# Patient Record
Sex: Male | Born: 1945 | Race: White | Hispanic: No | Marital: Married | Smoking: Never smoker
Health system: Southern US, Community
[De-identification: ages and names within clinical notes are randomized; demographics above are authoritative.]

## PROBLEM LIST (undated history)

## (undated) DIAGNOSIS — G4730 Sleep apnea, unspecified: Secondary | ICD-10-CM

## (undated) DIAGNOSIS — I480 Paroxysmal atrial fibrillation: Secondary | ICD-10-CM

## (undated) DIAGNOSIS — K5792 Diverticulitis of intestine, part unspecified, without perforation or abscess without bleeding: Secondary | ICD-10-CM

## (undated) DIAGNOSIS — I499 Cardiac arrhythmia, unspecified: Secondary | ICD-10-CM

## (undated) HISTORY — PX: NO PAST SURGERIES: SHX2092

## (undated) HISTORY — DX: Paroxysmal atrial fibrillation: I48.0

## (undated) HISTORY — PX: APPENDECTOMY: SHX54

## (undated) HISTORY — DX: Sleep apnea, unspecified: G47.30

## (undated) HISTORY — PX: ESOPHAGOGASTRIC FUNDOPLICATION: SHX405

---

## 1997-12-18 ENCOUNTER — Ambulatory Visit: Admission: RE | Admit: 1997-12-18 | Discharge: 1997-12-18 | Payer: Self-pay | Admitting: Internal Medicine

## 1999-06-26 ENCOUNTER — Ambulatory Visit (HOSPITAL_COMMUNITY): Admission: RE | Admit: 1999-06-26 | Discharge: 1999-06-26 | Payer: Self-pay | Admitting: Gastroenterology

## 2003-06-06 ENCOUNTER — Encounter: Admission: RE | Admit: 2003-06-06 | Discharge: 2003-06-06 | Payer: Self-pay | Admitting: Internal Medicine

## 2006-01-15 ENCOUNTER — Emergency Department (HOSPITAL_COMMUNITY): Admission: EM | Admit: 2006-01-15 | Discharge: 2006-01-15 | Payer: Self-pay | Admitting: Emergency Medicine

## 2009-10-23 DIAGNOSIS — H9319 Tinnitus, unspecified ear: Secondary | ICD-10-CM | POA: Insufficient documentation

## 2009-10-23 DIAGNOSIS — G4733 Obstructive sleep apnea (adult) (pediatric): Secondary | ICD-10-CM | POA: Insufficient documentation

## 2011-01-14 DIAGNOSIS — R7301 Impaired fasting glucose: Secondary | ICD-10-CM | POA: Insufficient documentation

## 2011-02-07 ENCOUNTER — Telehealth: Payer: Self-pay | Admitting: Internal Medicine

## 2011-02-07 NOTE — Telephone Encounter (Signed)
Called and spoke with pt.  Pt states he had a sleep study done approx 12 to 14 years ago and was dx with severe osa.  Pt states he has been doing fine.  Wears cpap machine regularly. Keeps up with mask changes and supplies that his PCP orders and signs for him.  Pt just wanted to know if there was a need for him to have a repeat sleep study.  Informed pt that unless his insurance requires him to have one or if he is symptomatic like excessive daytime sleepiness or increase or decrease in weight than he shouldn't need another sleep study.  Pt denied any of these.  States he has gained "only a few pounds"  Denies being tired during the day and denies any complaints with his cpap machine.  Nothing further needed.  Will sign off on message.

## 2011-03-19 DIAGNOSIS — Z86010 Personal history of colonic polyps: Secondary | ICD-10-CM | POA: Diagnosis not present

## 2011-03-19 DIAGNOSIS — K5730 Diverticulosis of large intestine without perforation or abscess without bleeding: Secondary | ICD-10-CM | POA: Diagnosis not present

## 2011-03-19 DIAGNOSIS — Z09 Encounter for follow-up examination after completed treatment for conditions other than malignant neoplasm: Secondary | ICD-10-CM | POA: Diagnosis not present

## 2011-03-19 DIAGNOSIS — Z800 Family history of malignant neoplasm of digestive organs: Secondary | ICD-10-CM | POA: Diagnosis not present

## 2011-03-19 DIAGNOSIS — D126 Benign neoplasm of colon, unspecified: Secondary | ICD-10-CM | POA: Diagnosis not present

## 2011-03-26 DIAGNOSIS — L570 Actinic keratosis: Secondary | ICD-10-CM | POA: Diagnosis not present

## 2011-03-26 DIAGNOSIS — D044 Carcinoma in situ of skin of scalp and neck: Secondary | ICD-10-CM | POA: Diagnosis not present

## 2011-03-26 DIAGNOSIS — D485 Neoplasm of uncertain behavior of skin: Secondary | ICD-10-CM | POA: Diagnosis not present

## 2011-03-27 DIAGNOSIS — H4011X0 Primary open-angle glaucoma, stage unspecified: Secondary | ICD-10-CM | POA: Diagnosis not present

## 2011-03-27 DIAGNOSIS — H409 Unspecified glaucoma: Secondary | ICD-10-CM | POA: Diagnosis not present

## 2011-05-07 DIAGNOSIS — H4010X0 Unspecified open-angle glaucoma, stage unspecified: Secondary | ICD-10-CM | POA: Diagnosis not present

## 2011-09-23 DIAGNOSIS — I10 Essential (primary) hypertension: Secondary | ICD-10-CM | POA: Diagnosis not present

## 2011-09-23 DIAGNOSIS — R5381 Other malaise: Secondary | ICD-10-CM | POA: Diagnosis not present

## 2011-09-23 DIAGNOSIS — R5383 Other fatigue: Secondary | ICD-10-CM | POA: Diagnosis not present

## 2011-09-23 DIAGNOSIS — F0390 Unspecified dementia without behavioral disturbance: Secondary | ICD-10-CM | POA: Diagnosis not present

## 2011-09-23 DIAGNOSIS — R269 Unspecified abnormalities of gait and mobility: Secondary | ICD-10-CM | POA: Diagnosis not present

## 2011-09-26 DIAGNOSIS — H40019 Open angle with borderline findings, low risk, unspecified eye: Secondary | ICD-10-CM | POA: Diagnosis not present

## 2011-10-02 DIAGNOSIS — L570 Actinic keratosis: Secondary | ICD-10-CM | POA: Diagnosis not present

## 2011-10-02 DIAGNOSIS — L738 Other specified follicular disorders: Secondary | ICD-10-CM | POA: Diagnosis not present

## 2011-10-02 DIAGNOSIS — D485 Neoplasm of uncertain behavior of skin: Secondary | ICD-10-CM | POA: Diagnosis not present

## 2011-10-10 DIAGNOSIS — H60399 Other infective otitis externa, unspecified ear: Secondary | ICD-10-CM | POA: Diagnosis not present

## 2011-10-10 DIAGNOSIS — H66009 Acute suppurative otitis media without spontaneous rupture of ear drum, unspecified ear: Secondary | ICD-10-CM | POA: Diagnosis not present

## 2011-10-24 DIAGNOSIS — H93299 Other abnormal auditory perceptions, unspecified ear: Secondary | ICD-10-CM | POA: Diagnosis not present

## 2011-10-24 DIAGNOSIS — H60399 Other infective otitis externa, unspecified ear: Secondary | ICD-10-CM | POA: Diagnosis not present

## 2011-11-28 DIAGNOSIS — Z23 Encounter for immunization: Secondary | ICD-10-CM | POA: Diagnosis not present

## 2012-01-21 DIAGNOSIS — H353190 Nonexudative age-related macular degeneration, unspecified eye, stage unspecified: Secondary | ICD-10-CM | POA: Diagnosis not present

## 2012-02-13 DIAGNOSIS — H01119 Allergic dermatitis of unspecified eye, unspecified eyelid: Secondary | ICD-10-CM | POA: Diagnosis not present

## 2012-03-19 DIAGNOSIS — E781 Pure hyperglyceridemia: Secondary | ICD-10-CM | POA: Diagnosis not present

## 2012-03-19 DIAGNOSIS — I4891 Unspecified atrial fibrillation: Secondary | ICD-10-CM | POA: Diagnosis not present

## 2012-03-19 DIAGNOSIS — R7301 Impaired fasting glucose: Secondary | ICD-10-CM | POA: Diagnosis not present

## 2012-03-19 DIAGNOSIS — Z125 Encounter for screening for malignant neoplasm of prostate: Secondary | ICD-10-CM | POA: Diagnosis not present

## 2012-03-19 DIAGNOSIS — Z0000 Encounter for general adult medical examination without abnormal findings: Secondary | ICD-10-CM | POA: Diagnosis not present

## 2012-03-31 DIAGNOSIS — H353290 Exudative age-related macular degeneration, unspecified eye, stage unspecified: Secondary | ICD-10-CM | POA: Diagnosis not present

## 2012-03-31 DIAGNOSIS — H35379 Puckering of macula, unspecified eye: Secondary | ICD-10-CM | POA: Diagnosis not present

## 2012-03-31 DIAGNOSIS — H2589 Other age-related cataract: Secondary | ICD-10-CM | POA: Diagnosis not present

## 2012-03-31 DIAGNOSIS — H4011X0 Primary open-angle glaucoma, stage unspecified: Secondary | ICD-10-CM | POA: Diagnosis not present

## 2012-04-02 DIAGNOSIS — Z0000 Encounter for general adult medical examination without abnormal findings: Secondary | ICD-10-CM | POA: Diagnosis not present

## 2012-04-02 DIAGNOSIS — R011 Cardiac murmur, unspecified: Secondary | ICD-10-CM | POA: Diagnosis not present

## 2012-04-02 DIAGNOSIS — R7301 Impaired fasting glucose: Secondary | ICD-10-CM | POA: Diagnosis not present

## 2012-04-02 DIAGNOSIS — Z125 Encounter for screening for malignant neoplasm of prostate: Secondary | ICD-10-CM | POA: Diagnosis not present

## 2012-04-22 DIAGNOSIS — L570 Actinic keratosis: Secondary | ICD-10-CM | POA: Diagnosis not present

## 2012-07-01 DIAGNOSIS — E785 Hyperlipidemia, unspecified: Secondary | ICD-10-CM | POA: Diagnosis not present

## 2012-09-02 DIAGNOSIS — H409 Unspecified glaucoma: Secondary | ICD-10-CM | POA: Diagnosis not present

## 2012-09-02 DIAGNOSIS — H4011X0 Primary open-angle glaucoma, stage unspecified: Secondary | ICD-10-CM | POA: Diagnosis not present

## 2012-09-23 DIAGNOSIS — H4010X0 Unspecified open-angle glaucoma, stage unspecified: Secondary | ICD-10-CM | POA: Diagnosis not present

## 2012-09-23 DIAGNOSIS — H40019 Open angle with borderline findings, low risk, unspecified eye: Secondary | ICD-10-CM | POA: Diagnosis not present

## 2012-09-23 DIAGNOSIS — H409 Unspecified glaucoma: Secondary | ICD-10-CM | POA: Diagnosis not present

## 2012-11-09 DIAGNOSIS — E781 Pure hyperglyceridemia: Secondary | ICD-10-CM | POA: Diagnosis not present

## 2012-11-09 DIAGNOSIS — Z23 Encounter for immunization: Secondary | ICD-10-CM | POA: Diagnosis not present

## 2012-12-28 DIAGNOSIS — L259 Unspecified contact dermatitis, unspecified cause: Secondary | ICD-10-CM | POA: Diagnosis not present

## 2012-12-28 DIAGNOSIS — L570 Actinic keratosis: Secondary | ICD-10-CM | POA: Diagnosis not present

## 2012-12-28 DIAGNOSIS — B079 Viral wart, unspecified: Secondary | ICD-10-CM | POA: Diagnosis not present

## 2012-12-28 DIAGNOSIS — D485 Neoplasm of uncertain behavior of skin: Secondary | ICD-10-CM | POA: Diagnosis not present

## 2012-12-30 DIAGNOSIS — H903 Sensorineural hearing loss, bilateral: Secondary | ICD-10-CM | POA: Diagnosis not present

## 2013-02-13 DIAGNOSIS — R1032 Left lower quadrant pain: Secondary | ICD-10-CM | POA: Diagnosis not present

## 2013-02-24 DIAGNOSIS — H4011X0 Primary open-angle glaucoma, stage unspecified: Secondary | ICD-10-CM | POA: Diagnosis not present

## 2013-02-24 DIAGNOSIS — H52229 Regular astigmatism, unspecified eye: Secondary | ICD-10-CM | POA: Diagnosis not present

## 2013-02-24 DIAGNOSIS — H2589 Other age-related cataract: Secondary | ICD-10-CM | POA: Diagnosis not present

## 2013-02-24 DIAGNOSIS — H5210 Myopia, unspecified eye: Secondary | ICD-10-CM | POA: Diagnosis not present

## 2013-03-02 ENCOUNTER — Other Ambulatory Visit: Payer: Self-pay | Admitting: Gastroenterology

## 2013-03-02 DIAGNOSIS — R1032 Left lower quadrant pain: Secondary | ICD-10-CM | POA: Diagnosis not present

## 2013-03-04 ENCOUNTER — Ambulatory Visit
Admission: RE | Admit: 2013-03-04 | Discharge: 2013-03-04 | Disposition: A | Discharge status: Home / Self Care | Payer: Medicare Other | Source: Ambulatory Visit | Attending: Gastroenterology | Admitting: Gastroenterology

## 2013-03-04 DIAGNOSIS — R1032 Left lower quadrant pain: Secondary | ICD-10-CM

## 2013-03-04 DIAGNOSIS — K429 Umbilical hernia without obstruction or gangrene: Secondary | ICD-10-CM | POA: Diagnosis not present

## 2013-03-04 DIAGNOSIS — K5732 Diverticulitis of large intestine without perforation or abscess without bleeding: Secondary | ICD-10-CM | POA: Diagnosis not present

## 2013-03-04 MED ORDER — IOHEXOL 300 MG/ML  SOLN
125.0000 mL | Freq: Once | INTRAMUSCULAR | Status: AC | PRN
  Administered 2013-03-04: 125 mL via INTRAVENOUS

## 2013-04-08 DIAGNOSIS — Z125 Encounter for screening for malignant neoplasm of prostate: Secondary | ICD-10-CM | POA: Diagnosis not present

## 2013-04-08 DIAGNOSIS — I4891 Unspecified atrial fibrillation: Secondary | ICD-10-CM | POA: Diagnosis not present

## 2013-04-08 DIAGNOSIS — E781 Pure hyperglyceridemia: Secondary | ICD-10-CM | POA: Diagnosis not present

## 2013-04-08 DIAGNOSIS — R7301 Impaired fasting glucose: Secondary | ICD-10-CM | POA: Diagnosis not present

## 2013-04-20 DIAGNOSIS — R7301 Impaired fasting glucose: Secondary | ICD-10-CM | POA: Diagnosis not present

## 2013-04-20 DIAGNOSIS — Z125 Encounter for screening for malignant neoplasm of prostate: Secondary | ICD-10-CM | POA: Diagnosis not present

## 2013-04-20 DIAGNOSIS — Z1331 Encounter for screening for depression: Secondary | ICD-10-CM | POA: Diagnosis not present

## 2013-04-20 DIAGNOSIS — Z0000 Encounter for general adult medical examination without abnormal findings: Secondary | ICD-10-CM | POA: Diagnosis not present

## 2013-04-20 DIAGNOSIS — E781 Pure hyperglyceridemia: Secondary | ICD-10-CM | POA: Diagnosis not present

## 2013-04-20 DIAGNOSIS — L570 Actinic keratosis: Secondary | ICD-10-CM | POA: Diagnosis not present

## 2013-04-20 DIAGNOSIS — G4733 Obstructive sleep apnea (adult) (pediatric): Secondary | ICD-10-CM | POA: Diagnosis not present

## 2013-04-20 DIAGNOSIS — H9319 Tinnitus, unspecified ear: Secondary | ICD-10-CM | POA: Diagnosis not present

## 2013-04-20 DIAGNOSIS — I4891 Unspecified atrial fibrillation: Secondary | ICD-10-CM | POA: Diagnosis not present

## 2013-05-24 DIAGNOSIS — K5732 Diverticulitis of large intestine without perforation or abscess without bleeding: Secondary | ICD-10-CM | POA: Diagnosis not present

## 2013-05-24 DIAGNOSIS — R1032 Left lower quadrant pain: Secondary | ICD-10-CM | POA: Diagnosis not present

## 2013-05-24 DIAGNOSIS — K5901 Slow transit constipation: Secondary | ICD-10-CM | POA: Diagnosis not present

## 2013-06-22 DIAGNOSIS — H409 Unspecified glaucoma: Secondary | ICD-10-CM | POA: Diagnosis not present

## 2013-06-22 DIAGNOSIS — H4011X0 Primary open-angle glaucoma, stage unspecified: Secondary | ICD-10-CM | POA: Diagnosis not present

## 2013-07-20 DIAGNOSIS — K5901 Slow transit constipation: Secondary | ICD-10-CM | POA: Diagnosis not present

## 2013-07-20 DIAGNOSIS — K5732 Diverticulitis of large intestine without perforation or abscess without bleeding: Secondary | ICD-10-CM | POA: Diagnosis not present

## 2013-08-09 DIAGNOSIS — E781 Pure hyperglyceridemia: Secondary | ICD-10-CM | POA: Diagnosis not present

## 2013-08-09 DIAGNOSIS — D649 Anemia, unspecified: Secondary | ICD-10-CM | POA: Diagnosis not present

## 2013-08-09 DIAGNOSIS — E785 Hyperlipidemia, unspecified: Secondary | ICD-10-CM | POA: Diagnosis not present

## 2013-09-22 DIAGNOSIS — H4011X0 Primary open-angle glaucoma, stage unspecified: Secondary | ICD-10-CM | POA: Diagnosis not present

## 2013-09-22 DIAGNOSIS — H4010X0 Unspecified open-angle glaucoma, stage unspecified: Secondary | ICD-10-CM | POA: Diagnosis not present

## 2013-10-23 DIAGNOSIS — H60339 Swimmer's ear, unspecified ear: Secondary | ICD-10-CM | POA: Diagnosis not present

## 2013-10-27 DIAGNOSIS — H6120 Impacted cerumen, unspecified ear: Secondary | ICD-10-CM | POA: Diagnosis not present

## 2013-10-27 DIAGNOSIS — H60399 Other infective otitis externa, unspecified ear: Secondary | ICD-10-CM | POA: Diagnosis not present

## 2013-11-24 DIAGNOSIS — Z23 Encounter for immunization: Secondary | ICD-10-CM | POA: Diagnosis not present

## 2013-12-14 DIAGNOSIS — L821 Other seborrheic keratosis: Secondary | ICD-10-CM | POA: Diagnosis not present

## 2013-12-14 DIAGNOSIS — D239 Other benign neoplasm of skin, unspecified: Secondary | ICD-10-CM | POA: Diagnosis not present

## 2013-12-14 DIAGNOSIS — L570 Actinic keratosis: Secondary | ICD-10-CM | POA: Diagnosis not present

## 2013-12-20 DIAGNOSIS — Z8669 Personal history of other diseases of the nervous system and sense organs: Secondary | ICD-10-CM | POA: Diagnosis not present

## 2014-01-26 DIAGNOSIS — H25819 Combined forms of age-related cataract, unspecified eye: Secondary | ICD-10-CM | POA: Diagnosis not present

## 2014-01-26 DIAGNOSIS — H4011X2 Primary open-angle glaucoma, moderate stage: Secondary | ICD-10-CM | POA: Diagnosis not present

## 2014-04-18 DIAGNOSIS — R7301 Impaired fasting glucose: Secondary | ICD-10-CM | POA: Diagnosis not present

## 2014-04-18 DIAGNOSIS — Z125 Encounter for screening for malignant neoplasm of prostate: Secondary | ICD-10-CM | POA: Diagnosis not present

## 2014-04-18 DIAGNOSIS — Z008 Encounter for other general examination: Secondary | ICD-10-CM | POA: Diagnosis not present

## 2014-04-18 DIAGNOSIS — D649 Anemia, unspecified: Secondary | ICD-10-CM | POA: Diagnosis not present

## 2014-04-18 DIAGNOSIS — E781 Pure hyperglyceridemia: Secondary | ICD-10-CM | POA: Diagnosis not present

## 2014-04-25 DIAGNOSIS — Z1389 Encounter for screening for other disorder: Secondary | ICD-10-CM | POA: Diagnosis not present

## 2014-04-25 DIAGNOSIS — L570 Actinic keratosis: Secondary | ICD-10-CM | POA: Diagnosis not present

## 2014-04-25 DIAGNOSIS — Z23 Encounter for immunization: Secondary | ICD-10-CM | POA: Diagnosis not present

## 2014-04-25 DIAGNOSIS — Z0000 Encounter for general adult medical examination without abnormal findings: Secondary | ICD-10-CM | POA: Diagnosis not present

## 2014-04-25 DIAGNOSIS — Z6829 Body mass index (BMI) 29.0-29.9, adult: Secondary | ICD-10-CM | POA: Diagnosis not present

## 2014-04-25 DIAGNOSIS — R011 Cardiac murmur, unspecified: Secondary | ICD-10-CM | POA: Diagnosis not present

## 2014-04-25 DIAGNOSIS — I480 Paroxysmal atrial fibrillation: Secondary | ICD-10-CM | POA: Diagnosis not present

## 2014-04-25 DIAGNOSIS — K219 Gastro-esophageal reflux disease without esophagitis: Secondary | ICD-10-CM | POA: Diagnosis not present

## 2014-04-25 DIAGNOSIS — R7301 Impaired fasting glucose: Secondary | ICD-10-CM | POA: Diagnosis not present

## 2014-04-25 DIAGNOSIS — G4733 Obstructive sleep apnea (adult) (pediatric): Secondary | ICD-10-CM | POA: Diagnosis not present

## 2014-04-25 DIAGNOSIS — E781 Pure hyperglyceridemia: Secondary | ICD-10-CM | POA: Diagnosis not present

## 2014-04-25 DIAGNOSIS — Z125 Encounter for screening for malignant neoplasm of prostate: Secondary | ICD-10-CM | POA: Diagnosis not present

## 2014-05-09 DIAGNOSIS — H4011X3 Primary open-angle glaucoma, severe stage: Secondary | ICD-10-CM | POA: Diagnosis not present

## 2014-05-18 DIAGNOSIS — H10413 Chronic giant papillary conjunctivitis, bilateral: Secondary | ICD-10-CM | POA: Diagnosis not present

## 2014-05-27 DIAGNOSIS — H10413 Chronic giant papillary conjunctivitis, bilateral: Secondary | ICD-10-CM | POA: Diagnosis not present

## 2014-06-06 ENCOUNTER — Encounter: Payer: Self-pay | Admitting: *Deleted

## 2014-06-07 ENCOUNTER — Ambulatory Visit (INDEPENDENT_AMBULATORY_CARE_PROVIDER_SITE_OTHER): Payer: Medicare Other | Admitting: Interventional Cardiology

## 2014-06-07 ENCOUNTER — Encounter: Payer: Self-pay | Admitting: Interventional Cardiology

## 2014-06-07 VITALS — BP 148/80 | HR 53 | Ht 68.0 in | Wt 200.8 lb

## 2014-06-07 DIAGNOSIS — R030 Elevated blood-pressure reading, without diagnosis of hypertension: Secondary | ICD-10-CM | POA: Insufficient documentation

## 2014-06-07 DIAGNOSIS — E785 Hyperlipidemia, unspecified: Secondary | ICD-10-CM | POA: Insufficient documentation

## 2014-06-07 DIAGNOSIS — I480 Paroxysmal atrial fibrillation: Secondary | ICD-10-CM

## 2014-06-07 HISTORY — DX: Elevated blood-pressure reading, without diagnosis of hypertension: R03.0

## 2014-06-07 HISTORY — DX: Hyperlipidemia, unspecified: E78.5

## 2014-06-07 NOTE — Progress Notes (Signed)
Patient ID: Zachary Montoya, male   DOB: 07/21/1945, 69 y.o.   MRN: [id number]     Cardiology Office Note   Date:  06/07/2014   ID:  Zachary Montoya, DOB 1945/12/26, MRN [id number]  PCP:  No primary care provider on file.    No chief complaint on file. PAF   Wt Readings from Last 3 Encounters:  06/07/14 200 lb 12.8 oz (91.082 kg)       History of Present Illness: Zachary Montoya is a 69 y.o. male  Who has had PAF in the past.  I last saw him in 12 /12. He contiues to have episodes of AFib once every couple months.  Episodes last up to 2-3 hours.  The longest ever was 4-6 hours.  THis is rare.  Uses CPAP for his sleep apnea.  No h/o HTN, DM.  BP is getting borderline at this time.  Typical numbers are 818-299 systolic.    Exercises 3x/week without any sx.  No CP or SHOB.  No change in exercise tolerance.  Lost 20 lbs intentionally. His diet is healthy.     Past Medical History  Diagnosis Date  . Sleep apnea     No past surgical history on file.   Current Outpatient Prescriptions  Medication Sig Dispense Refill  . aspirin 325 MG tablet Take 325 mg by mouth daily.    Marland Kitchen atorvastatin (LIPITOR) 20 MG tablet Take 20 mg by mouth daily.  3  . Calcium Carbonate-Vitamin D (CALCIUM-D PO) Take 1 tablet by mouth daily. 600 mg calcium + 800 iu vit. d    . cetirizine (ZYRTEC) 10 MG tablet Take 10 mg by mouth daily.    . Cholecalciferol (VITAMIN D3) 2000 UNITS TABS Take 2,000 Units by mouth daily.    . Coenzyme Q10 (COQ10 PO) Take 300 mg by mouth daily.    . ferrous sulfate 325 (65 FE) MG tablet Take 325 mg by mouth daily.    . metoprolol tartrate (LOPRESSOR) 25 MG tablet Take 25 mg by mouth 2 (two) times daily as needed (For palpitations, A-fib).    . Multiple Vitamins-Minerals (CENTRUM ADULTS PO) Take 1 tablet by mouth daily.    . Omega-3 Fatty Acids (OMEGA 3 PO) Take 1,280 mg by mouth daily.    Marland Kitchen omeprazole (PRILOSEC) 20 MG capsule Take 20 mg by mouth daily.  3  . Probiotic  Product (PROBIOTIC DAILY PO) Take 1 tablet by mouth daily.    . timolol (TIMOPTIC) 0.5 % ophthalmic solution Place 2 drops into both eyes daily.  3  . Triamcinolone Acetonide (NASACORT ALLERGY 24HR NA) Place 1 drop into the nose daily.     No current facility-administered medications for this visit.    Allergies:   Review of patient's allergies indicates no known allergies.    Social History:  The patient  reports that he has never smoked. He has never used smokeless tobacco.   Family History:  The patient's family history includes Cancer in his brother; Diabetes in his mother; Hypertension in his mother.    ROS:  Please see the history of present illness.   Otherwise, review of systems are positive for episodic palpitations.   All other systems are reviewed and negative.    PHYSICAL EXAM: VS:  BP 148/80 mmHg  Pulse 53  Ht 5\' 8"  (1.727 m)  Wt 200 lb 12.8 oz (91.082 kg)  BMI 30.54 kg/m2 , BMI Body mass index is 30.54 kg/(m^2). GEN: Well nourished, well developed,  in no acute distress HEENT: normal Neck: no JVD, carotid bruits, or masses Cardiac: RRR; 2/6 systolic murmur,no rubs, or gallops,no edema ; 2+ PT pulses bilaterally Respiratory:  clear to auscultation bilaterally, normal work of breathing GI: soft, nontender, nondistended, + BS MS: no deformity or atrophy Skin: warm and dry, no rash Neuro:  Strength and sensation are intact Psych: euthymic mood, full affect   EKG:   The ekg ordered today demonstrates Sinus bradycardia, no ST segment changes   Recent Labs: No results found for requested labs within last 365 days.   Lipid Panel No results found for: CHOL, TRIG, HDL, CHOLHDL, VLDL, LDLCALC, LDLDIRECT   Other studies Reviewed: Additional studies/ records that were reviewed today with results demonstrating: prior records from [facility].   ASSESSMENT AND PLAN:  1. AFib: paroxysmal: Continue when necessary metoprolol. Symptoms are not bothersome enough that he would  want to take the metoprolol regularly. Regardless, he has resting bradycardia so medication for tachycardia would be challenging.  Would need to consider EP referral if tachyarrhythmia gets more frequent. 2. Hyperlipidemia: Continue lipid-lowering therapy. 3. Borderline blood pressure: Continue to monitor. Continue regular exercise. Continue low-salt diet. This patients CHA2DS2-VASc Score and unadjusted Ischemic Stroke Rate (% per year) is equal to 0.6 % stroke rate/year from a score of 1  Above score calculated as 1 point each if present [CHF, HTN, DM, Vascular=MI/PAD/Aortic Plaque, Age if 65-74, or Male] Above score calculated as 2 points each if present [Age > 75, or Stroke/TIA/TE]     Current medicines are reviewed at length with the patient today.  The patient concerns regarding his medicines were addressed.  The following changes have been made:  No change  Labs/ tests ordered today include:  No orders of the defined types were placed in this encounter.    Recommend 150 minutes/week of aerobic exercise Low fat, low carb, high fiber diet recommended  Disposition:   FU in 1 year   Teresita Madura., MD  06/07/2014 2:30 PM    [facility] [place], [place], [place]  [address] Phone: [phone]; Fax: [phone]

## 2014-06-07 NOTE — Patient Instructions (Signed)
Medication Instructions:  Your physician recommends that you continue on your current medications as directed. Please refer to the Current Medication list given to you today.   Labwork: NONE  Testing/Procedures: NONE  Follow-Up: FOLLOW UP WITH DR. VARANASI IN 1 YEAR  Any Other Special Instructions Will Be Listed Below (If Applicable).

## 2014-06-08 DIAGNOSIS — H401231 Low-tension glaucoma, bilateral, mild stage: Secondary | ICD-10-CM | POA: Diagnosis not present

## 2014-09-20 DIAGNOSIS — D485 Neoplasm of uncertain behavior of skin: Secondary | ICD-10-CM | POA: Diagnosis not present

## 2014-09-20 DIAGNOSIS — L821 Other seborrheic keratosis: Secondary | ICD-10-CM | POA: Diagnosis not present

## 2014-09-20 DIAGNOSIS — B078 Other viral warts: Secondary | ICD-10-CM | POA: Diagnosis not present

## 2014-09-20 DIAGNOSIS — L820 Inflamed seborrheic keratosis: Secondary | ICD-10-CM | POA: Diagnosis not present

## 2014-09-20 DIAGNOSIS — L570 Actinic keratosis: Secondary | ICD-10-CM | POA: Diagnosis not present

## 2014-09-26 DIAGNOSIS — H43811 Vitreous degeneration, right eye: Secondary | ICD-10-CM | POA: Diagnosis not present

## 2014-09-26 DIAGNOSIS — H2511 Age-related nuclear cataract, right eye: Secondary | ICD-10-CM | POA: Diagnosis not present

## 2014-09-26 DIAGNOSIS — H4010X0 Unspecified open-angle glaucoma, stage unspecified: Secondary | ICD-10-CM | POA: Diagnosis not present

## 2014-09-26 DIAGNOSIS — H18411 Arcus senilis, right eye: Secondary | ICD-10-CM | POA: Diagnosis not present

## 2014-12-01 DIAGNOSIS — Z23 Encounter for immunization: Secondary | ICD-10-CM | POA: Diagnosis not present

## 2014-12-22 DIAGNOSIS — R001 Bradycardia, unspecified: Secondary | ICD-10-CM | POA: Diagnosis not present

## 2014-12-22 DIAGNOSIS — I480 Paroxysmal atrial fibrillation: Secondary | ICD-10-CM | POA: Diagnosis not present

## 2014-12-22 DIAGNOSIS — R7301 Impaired fasting glucose: Secondary | ICD-10-CM | POA: Diagnosis not present

## 2014-12-22 DIAGNOSIS — R42 Dizziness and giddiness: Secondary | ICD-10-CM | POA: Diagnosis not present

## 2014-12-22 DIAGNOSIS — G4733 Obstructive sleep apnea (adult) (pediatric): Secondary | ICD-10-CM | POA: Diagnosis not present

## 2014-12-22 DIAGNOSIS — Z6830 Body mass index (BMI) 30.0-30.9, adult: Secondary | ICD-10-CM | POA: Diagnosis not present

## 2014-12-22 DIAGNOSIS — H6123 Impacted cerumen, bilateral: Secondary | ICD-10-CM | POA: Diagnosis not present

## 2014-12-22 DIAGNOSIS — R55 Syncope and collapse: Secondary | ICD-10-CM | POA: Diagnosis not present

## 2014-12-26 ENCOUNTER — Ambulatory Visit (INDEPENDENT_AMBULATORY_CARE_PROVIDER_SITE_OTHER): Payer: Medicare Other | Admitting: Nurse Practitioner

## 2014-12-26 ENCOUNTER — Encounter: Payer: Self-pay | Admitting: Nurse Practitioner

## 2014-12-26 VITALS — BP 118/88 | HR 61 | Ht 68.0 in | Wt 202.0 lb

## 2014-12-26 DIAGNOSIS — R42 Dizziness and giddiness: Secondary | ICD-10-CM | POA: Diagnosis not present

## 2014-12-26 DIAGNOSIS — R55 Syncope and collapse: Secondary | ICD-10-CM

## 2014-12-26 DIAGNOSIS — I480 Paroxysmal atrial fibrillation: Secondary | ICD-10-CM | POA: Diagnosis not present

## 2014-12-26 NOTE — Addendum Note (Signed)
Addended by: Dennie Fetters on: 12/26/2014 03:10 PM   Modules accepted: Orders, Medications

## 2014-12-26 NOTE — Progress Notes (Signed)
CARDIOLOGY OFFICE NOTE  Date:  12/26/2014    [name] Date of Birth: Nov 30, 1945 Medical Record #[id number]  PCP:  Jerlyn Ly, MD  Cardiologist:  Irish Lack    Chief Complaint  Patient presents with  . Atrial Fibrillation    Work in visit - seen for Dr. Irish Lack    History of Present Illness: Zachary Montoya is a 69 y.o. male who presents today for a work in visit. Seen for Dr. Irish Lack.   He has a history of PAF in the past.Seen previously in 2012 prior to last visit.  He has had episodes of AFib once every couple months. Episodes last up to 2-3 hours. The longest ever was 4-6 hours and this is noted as rare. Uses CPAP for his sleep apnea. No h/o HTN, DM. BP is getting borderline.  Seen by PCP last week with near syncope and dizziness. Had his ears cleaned out. Concern for need for possible event monitor. Noted that a week ago he had trace nausea and dizziness. Tuesday he felt like he was going to fall on the floor. No chest pain or dyspnea. Wednesday he felt a feeling of pressure in the head and woozy afterwards. Had also had some spinning sensation that resolved spontaneously. Some marital issues noted.   Comes in today. Here with his wife. Says he is not dizzy anymore. Stopped Timoptic eye drops as of Friday night and now "feels fine". No chest pain. He remains in NSR. He notes that for years he has been watching his HR get ever "so slow". Typically in the low to mid 50's. He now feels fine. Exercises 3 times a week without issue. No chest pain. Breathing is good. Typically has a spell of Af about every 8 to 12 weeks - will last anywhere from 2 to 8 hours. He uses metoprolol just prn.    Past Medical History  Diagnosis Date  . Sleep apnea   . PAF (paroxysmal atrial fibrillation) ([facility])     No past surgical history on file.   Medications: Current Outpatient Prescriptions  Medication Sig Dispense Refill  . aspirin 325 MG tablet Take 325 mg by mouth  daily.    Marland Kitchen atorvastatin (LIPITOR) 20 MG tablet Take 20 mg by mouth daily.  3  . Calcium Carbonate-Vitamin D (CALCIUM-D PO) Take 1 tablet by mouth daily. 600 mg calcium + 800 iu vit. d    . cetirizine (ZYRTEC) 10 MG tablet Take 10 mg by mouth daily.    . Cholecalciferol (VITAMIN D3) 2000 UNITS TABS Take 2,000 Units by mouth daily.    . Coenzyme Q10 (COQ10 PO) Take 300 mg by mouth daily.    . ferrous sulfate 325 (65 FE) MG tablet Take 325 mg by mouth daily.    . metoprolol tartrate (LOPRESSOR) 25 MG tablet Take 25 mg by mouth 2 (two) times daily as needed (For palpitations, A-fib).    . Multiple Vitamins-Minerals (CENTRUM ADULTS PO) Take 1 tablet by mouth daily.    . Omega-3 Fatty Acids (OMEGA 3 PO) Take 1,280 mg by mouth daily.    Marland Kitchen omeprazole (PRILOSEC) 20 MG capsule Take 20 mg by mouth daily.  3  . Probiotic Product (PROBIOTIC DAILY PO) Take 1 tablet by mouth daily.    . timolol (TIMOPTIC) 0.5 % ophthalmic solution Place 2 drops into both eyes daily.  3  . Triamcinolone Acetonide (NASACORT ALLERGY 24HR NA) Place 1 drop into the nose daily.     No  current facility-administered medications for this visit.    Allergies: Allergies  Allergen Reactions  . Timolol     Bradycardia/dizziness    Social History: The patient  reports that he has never smoked. He has never used smokeless tobacco.   Family History: The patient's family history includes Cancer in his brother; Diabetes in his mother; Hypertension in his mother.   Review of Systems: Please see the history of present illness.   Otherwise, the review of systems is positive for none.   All other systems are reviewed and negative.   Physical Exam: VS:  BP 118/88 mmHg  Pulse 61  Ht 5\' 8"  (1.727 m)  Wt 202 lb (91.627 kg)  BMI 30.72 kg/m2 .  BMI Body mass index is 30.72 kg/(m^2).  Wt Readings from Last 3 Encounters:  12/26/14 202 lb (91.627 kg)  06/07/14 200 lb 12.8 oz (91.082 kg)    General: Pleasant. Well developed, well  nourished and in no acute distress.  HEENT: Normal. Neck: Supple, no JVD, carotid bruits, or masses noted.  Cardiac: Regular rate and rhythm. He has an outflow murmur noted. No edema.  Respiratory:  Lungs are clear to auscultation bilaterally with normal work of breathing.  GI: Soft and nontender.  MS: No deformity or atrophy. Gait and ROM intact. Skin: Warm and dry. Color is normal.  Neuro:  Strength and sensation are intact and no gross focal deficits noted.  Psych: Alert, appropriate and with normal affect.   LABORATORY DATA:  EKG:  EKG is ordered today. This demonstrates NSR - rate is 61.  No results found for: WBC, HGB, HCT, PLT, GLUCOSE, CHOL, TRIG, HDL, LDLDIRECT, LDLCALC, ALT, AST, NA, K, CL, CREATININE, BUN, CO2, TSH, PSA, INR, GLUF, HGBA1C, MICROALBUR  BNP (last 3 results) No results for input(s): BNP in the last 8760 hours.  ProBNP (last 3 results) No results for input(s): PROBNP in the last 8760 hours.   Other Studies Reviewed Today:   Assessment/Plan: 1. AFib: paroxysmal: He has not had a change in his episodes of atrial fib. He is using just prn metoprolol. Symptoms are not bothersome enough that he would need to take the metoprolol regularly. Regardless, he has resting bradycardia so medication for tachycardia would be challenging. Would need to consider EP referral if tachyarrhythmia gets more frequent. 2. Hyperlipidemia: Continue lipid-lowering therapy. 3. Borderline blood pressure: Continue to monitor. Continue regular exercise. Continue low-salt diet. 4. Dizziness/presyncope - feels like it was from bradycardia from Timoptic. He is now totally asymptomatic. Would hold on event monitor for now but certainly proceed if he has recurrence. This reiterates the fact that any medicine for tachycardia will be challenging.  5. Murmur - will get echo updated.    Current medicines are reviewed with the patient today.  The patient does not have concerns regarding medicines  other than what has been noted above.  The following changes have been made:  See above.  Labs/ tests ordered today include:    Orders Placed This Encounter  Procedures  . EKG 12-Lead  . ECHOCARDIOGRAM COMPLETE     Disposition:   FU with Dr. Irish Lack as planned next April.   Patient is agreeable to this plan and will call if any problems develop in the interim.   Signed: Burtis Junes, RN, ANP-C 12/26/2014 3:06 PM  [facility] [address] [place], [place]  [address] Phone: [phone] Fax: [phone]

## 2014-12-26 NOTE — Patient Instructions (Addendum)
We will be checking the following labs today - NONE   Medication Instructions:    Continue with your current medicines.     Testing/Procedures To Be Arranged:  Echocardiogram  Follow-Up:   See Dr. Irish Lack as planned in April    Other Special Instructions:   If you have recurrent spells - call us so we can place a heart monitor.     If you need a refill on your cardiac medications before your next appointment, please call your pharmacy.   Call the [place] office at [phone] if you have any questions, problems or concerns.

## 2014-12-30 ENCOUNTER — Encounter: Payer: Self-pay | Admitting: Nurse Practitioner

## 2015-01-03 ENCOUNTER — Other Ambulatory Visit: Payer: Self-pay

## 2015-01-03 ENCOUNTER — Ambulatory Visit (HOSPITAL_COMMUNITY): Payer: Medicare Other | Attending: Cardiovascular Disease

## 2015-01-03 DIAGNOSIS — R55 Syncope and collapse: Secondary | ICD-10-CM

## 2015-01-03 DIAGNOSIS — I517 Cardiomegaly: Secondary | ICD-10-CM | POA: Diagnosis not present

## 2015-01-03 DIAGNOSIS — E785 Hyperlipidemia, unspecified: Secondary | ICD-10-CM | POA: Insufficient documentation

## 2015-01-03 DIAGNOSIS — I071 Rheumatic tricuspid insufficiency: Secondary | ICD-10-CM | POA: Insufficient documentation

## 2015-01-03 DIAGNOSIS — I371 Nonrheumatic pulmonary valve insufficiency: Secondary | ICD-10-CM | POA: Insufficient documentation

## 2015-01-03 DIAGNOSIS — I4891 Unspecified atrial fibrillation: Secondary | ICD-10-CM | POA: Diagnosis not present

## 2015-01-03 DIAGNOSIS — R42 Dizziness and giddiness: Secondary | ICD-10-CM | POA: Diagnosis not present

## 2015-01-03 DIAGNOSIS — I351 Nonrheumatic aortic (valve) insufficiency: Secondary | ICD-10-CM | POA: Insufficient documentation

## 2015-01-03 DIAGNOSIS — I480 Paroxysmal atrial fibrillation: Secondary | ICD-10-CM

## 2015-02-17 DIAGNOSIS — H401210 Low-tension glaucoma, right eye, stage unspecified: Secondary | ICD-10-CM | POA: Diagnosis not present

## 2015-03-30 DIAGNOSIS — G589 Mononeuropathy, unspecified: Secondary | ICD-10-CM | POA: Diagnosis not present

## 2015-03-30 DIAGNOSIS — R509 Fever, unspecified: Secondary | ICD-10-CM | POA: Diagnosis not present

## 2015-03-30 DIAGNOSIS — M542 Cervicalgia: Secondary | ICD-10-CM | POA: Diagnosis not present

## 2015-03-30 DIAGNOSIS — R51 Headache: Secondary | ICD-10-CM | POA: Diagnosis not present

## 2015-03-30 DIAGNOSIS — J019 Acute sinusitis, unspecified: Secondary | ICD-10-CM | POA: Diagnosis not present

## 2015-03-30 DIAGNOSIS — Z6829 Body mass index (BMI) 29.0-29.9, adult: Secondary | ICD-10-CM | POA: Diagnosis not present

## 2015-06-13 DIAGNOSIS — I480 Paroxysmal atrial fibrillation: Secondary | ICD-10-CM | POA: Diagnosis not present

## 2015-06-13 DIAGNOSIS — E781 Pure hyperglyceridemia: Secondary | ICD-10-CM | POA: Diagnosis not present

## 2015-06-13 DIAGNOSIS — R7301 Impaired fasting glucose: Secondary | ICD-10-CM | POA: Diagnosis not present

## 2015-06-13 DIAGNOSIS — D6489 Other specified anemias: Secondary | ICD-10-CM | POA: Diagnosis not present

## 2015-06-13 DIAGNOSIS — Z125 Encounter for screening for malignant neoplasm of prostate: Secondary | ICD-10-CM | POA: Diagnosis not present

## 2015-06-20 DIAGNOSIS — Z0000 Encounter for general adult medical examination without abnormal findings: Secondary | ICD-10-CM | POA: Diagnosis not present

## 2015-06-20 DIAGNOSIS — E781 Pure hyperglyceridemia: Secondary | ICD-10-CM | POA: Diagnosis not present

## 2015-06-20 DIAGNOSIS — G588 Other specified mononeuropathies: Secondary | ICD-10-CM | POA: Diagnosis not present

## 2015-06-20 DIAGNOSIS — Z6830 Body mass index (BMI) 30.0-30.9, adult: Secondary | ICD-10-CM | POA: Diagnosis not present

## 2015-06-20 DIAGNOSIS — R7301 Impaired fasting glucose: Secondary | ICD-10-CM | POA: Diagnosis not present

## 2015-06-20 DIAGNOSIS — H9193 Unspecified hearing loss, bilateral: Secondary | ICD-10-CM | POA: Diagnosis not present

## 2015-06-20 DIAGNOSIS — I480 Paroxysmal atrial fibrillation: Secondary | ICD-10-CM | POA: Diagnosis not present

## 2015-06-20 DIAGNOSIS — R51 Headache: Secondary | ICD-10-CM | POA: Diagnosis not present

## 2015-06-20 DIAGNOSIS — R001 Bradycardia, unspecified: Secondary | ICD-10-CM | POA: Diagnosis not present

## 2015-06-20 DIAGNOSIS — D6489 Other specified anemias: Secondary | ICD-10-CM | POA: Diagnosis not present

## 2015-06-20 DIAGNOSIS — L570 Actinic keratosis: Secondary | ICD-10-CM | POA: Diagnosis not present

## 2015-06-20 DIAGNOSIS — Z1389 Encounter for screening for other disorder: Secondary | ICD-10-CM | POA: Diagnosis not present

## 2015-06-27 NOTE — Progress Notes (Signed)
Patient ID: Zachary Montoya, male   DOB: Sep 14, 1945, 70 y.o.   MRN: [id number]     Cardiology Office Note   Date:  06/28/2015   ID:  Zachary Montoya, DOB Jul 07, 1945, MRN [id number]  PCP:  Jerlyn Ly, MD    No chief complaint on file. AFib   Wt Readings from Last 3 Encounters:  06/28/15 201 lb 1.9 oz (91.227 kg)  12/26/14 202 lb (91.627 kg)  06/07/14 200 lb 12.8 oz (91.082 kg)       History of Present Illness: Zachary Montoya is a 70 y.o. male  Who has had PAF in the past. I last saw him in 4/16. He contiues to have episodes of AFib, but now more frequently- every otherday. Episodes last up to 2-3 hours. The longest ever was 10 hours. THis is rare.  He takes metoprolol for the prolonged episodes. Uses CPAP for his sleep apnea. No h/o HTN, DM. BP is getting borderline at this time. Typical numbers are [id number] systolic.   Exercises 3x/week without any sx. No CP or SHOB. No change in exercise tolerance. Lost 20 lbs intentionally. His diet is healthy.     Past Medical History  Diagnosis Date  . Sleep apnea   . PAF (paroxysmal atrial fibrillation) ([facility])     History reviewed. No pertinent past surgical history.   Current Outpatient Prescriptions  Medication Sig Dispense Refill  . aspirin 325 MG tablet Take 325 mg by mouth daily.    Marland Kitchen atorvastatin (LIPITOR) 20 MG tablet Take 20 mg by mouth daily.  3  . Calcium Carbonate-Vitamin D (CALCIUM-D PO) Take 1 tablet by mouth daily. 600 mg calcium + 800 iu vit. d    . cetirizine (ZYRTEC) 10 MG tablet Take 10 mg by mouth daily.    . Cholecalciferol (VITAMIN D3) 2000 UNITS TABS Take 2,000 Units by mouth daily.    . Coenzyme Q10 (COQ10 PO) Take 300 mg by mouth daily.    . ferrous sulfate 325 (65 FE) MG tablet Take 325 mg by mouth daily.    . metoprolol tartrate (LOPRESSOR) 25 MG tablet Take 25 mg by mouth 2 (two) times daily as needed (For palpitations, A-fib).    . Multiple Vitamins-Minerals (CENTRUM ADULTS PO) Take 1  tablet by mouth daily.    . Multiple Vitamins-Minerals (ICAPS AREDS 2 PO) Take 1 capsule by mouth daily.    . Omega-3 Fatty Acids (OMEGA 3 PO) Take 1,280 mg by mouth daily.    Marland Kitchen omeprazole (PRILOSEC) 20 MG capsule Take 20 mg by mouth daily.  3  . Probiotic Product (PROBIOTIC DAILY PO) Take 1 tablet by mouth daily.    . Triamcinolone Acetonide (NASACORT ALLERGY 24HR NA) Place 1 drop into the nose daily.     No current facility-administered medications for this visit.    Allergies:   Timolol    Social History:  The patient  reports that he has never smoked. He has never used smokeless tobacco.   Family History:  The patient's family history includes Cancer in his brother; Diabetes in his mother; Hypertension in his mother; Stroke in his mother. There is no history of Heart attack.    ROS:  Please see the history of present illness.   Otherwise, review of systems are positive for palpitations.   All other systems are reviewed and negative.    PHYSICAL EXAM: VS:  BP 140/85 mmHg  Pulse 72  Ht 5\' 8"  (1.727 m)  Wt 201 lb 1.9 oz (  91.227 kg)  BMI 30.59 kg/m2 , BMI Body mass index is 30.59 kg/(m^2). GEN: Well nourished, well developed, in no acute distress HEENT: normal Neck: no JVD, carotid bruits, or masses Cardiac: RRR; no murmurs, rubs, or gallops,no edema  Respiratory:  clear to auscultation bilaterally, normal work of breathing GI: soft, nontender, nondistended, + BS MS: no deformity or atrophy Skin: warm and dry, no rash Neuro:  Strength and sensation are intact Psych: euthymic mood, full affect    Recent Labs: No results found for requested labs within last 365 days.   Lipid Panel No results found for: CHOL, TRIG, HDL, CHOLHDL, VLDL, LDLCALC, LDLDIRECT   Other studies Reviewed: Additional studies/ records that were reviewed today with results demonstrating: Normal LV function in 11/16..   ASSESSMENT AND PLAN:  1. AFib: paroxysmal: Increased frequency and duration of  symptoms. Continue when necessary metoprolol for now.  Plan for event monitor.  If sx persist, start metoprolol 25 mg BID.  Would need to consider EP referral depending on findings from monitor. This patients CHA2DS2-VASc Score and unadjusted Ischemic Stroke Rate (% per year) is equal to 0.6 % stroke rate/year from a score of 1  Above score calculated as 1 point each if present [CHF, HTN, DM, Vascular=MI/PAD/Aortic Plaque, Age if 65-74, or Male] Above score calculated as 2 points each if present [Age > 75, or Stroke/TIA/TE]   2. Hyperlipidemia: Continue lipid-lowering therapy. 3. Borderline blood pressure: Continue to monitor. Continue regular exercise. Continue low-salt diet. BP at blood donation has been in the 130/75 range.    Current medicines are reviewed at length with the patient today.  The patient concerns regarding his medicines were addressed.  The following changes have been made:  No change  Labs/ tests ordered today include: event monitor No orders of the defined types were placed in this encounter.    Recommend 150 minutes/week of aerobic exercise Low fat, low carb, high fiber diet recommended  Disposition:   FU in 6 weeks   Teresita Madura., MD  06/28/2015 10:00 AM    [facility] [place], [place],   [address] Phone: [phone]; Fax: [phone]

## 2015-06-28 ENCOUNTER — Ambulatory Visit (INDEPENDENT_AMBULATORY_CARE_PROVIDER_SITE_OTHER): Payer: Medicare Other

## 2015-06-28 ENCOUNTER — Ambulatory Visit (INDEPENDENT_AMBULATORY_CARE_PROVIDER_SITE_OTHER): Payer: Medicare Other | Admitting: Interventional Cardiology

## 2015-06-28 ENCOUNTER — Encounter: Payer: Self-pay | Admitting: Interventional Cardiology

## 2015-06-28 VITALS — BP 140/85 | HR 72 | Ht 68.0 in | Wt 201.1 lb

## 2015-06-28 DIAGNOSIS — E785 Hyperlipidemia, unspecified: Secondary | ICD-10-CM

## 2015-06-28 DIAGNOSIS — I480 Paroxysmal atrial fibrillation: Secondary | ICD-10-CM

## 2015-06-28 DIAGNOSIS — R002 Palpitations: Secondary | ICD-10-CM

## 2015-06-28 DIAGNOSIS — R030 Elevated blood-pressure reading, without diagnosis of hypertension: Secondary | ICD-10-CM

## 2015-06-28 NOTE — Patient Instructions (Signed)
Medication Instructions:  Same-no changes  Labwork: None  Testing/Procedures: Your physician has recommended that you wear an event monitor for 30 days. Event monitors are medical devices that record the heart's electrical activity. Doctors most often [place] these monitors to diagnose arrhythmias. Arrhythmias are problems with the speed or rhythm of the heartbeat. The monitor is a small, portable device. You can wear one while you do your normal daily activities. This is usually used to diagnose what is causing palpitations/syncope (passing out).  Follow-Up: Your physician recommends that you schedule a follow-up appointment in: 6 weeks with NP/PA    Any Other Special Instructions Will Be Listed Below (If Applicable).     If you need a refill on your cardiac medications before your next appointment, please call your pharmacy.

## 2015-07-06 ENCOUNTER — Telehealth: Payer: Self-pay | Admitting: Interventional Cardiology

## 2015-07-06 NOTE — Telephone Encounter (Signed)
errror

## 2015-07-10 DIAGNOSIS — H25011 Cortical age-related cataract, right eye: Secondary | ICD-10-CM | POA: Diagnosis not present

## 2015-07-10 DIAGNOSIS — H2512 Age-related nuclear cataract, left eye: Secondary | ICD-10-CM | POA: Diagnosis not present

## 2015-07-10 DIAGNOSIS — H401290 Low-tension glaucoma, unspecified eye, stage unspecified: Secondary | ICD-10-CM | POA: Diagnosis not present

## 2015-07-10 DIAGNOSIS — H43811 Vitreous degeneration, right eye: Secondary | ICD-10-CM | POA: Diagnosis not present

## 2015-07-19 NOTE — Telephone Encounter (Signed)
Let me review the monitor and then we can see who he needs to see.

## 2015-07-19 NOTE — Telephone Encounter (Signed)
**Note De-Identified  Obfuscation** I attempted to schedule the pt an appt with a PA or NP but the pt declined. He states that he has been having "a number of different arhythmia's while wearing this monitor". He wants to f/u with Dr Irish Lack as Dr Irish Lack is the the provider that ordered his monitor. He states that it would be okay too if Dr Irish Lack wants to call him with his results.  Please advise.

## 2015-07-19 NOTE — Telephone Encounter (Signed)
**Note De-Identified  Obfuscation** The pt is advised and he is in agreement with plan.

## 2015-07-19 NOTE — Telephone Encounter (Signed)
New Message  Pt was called to resch appt from Cecilie Kicks due to schedule changes. Pt declined to resch appt with a PA or NP because he has a Holter monitor and doesn't believe that a PA or NP will be able to read the results accurately. He also states that since Dr. Irish Lack ordered the monitor, per pt the appt should be with Dr. Irish Lack. Pt understands that the next available appt is in September with Dr. Irish Lack. However he would like to speak to a nurse to retrieve either a sooner appt or call back to discuss if it is ok to see a PA. Please call

## 2015-08-04 ENCOUNTER — Other Ambulatory Visit: Payer: Self-pay

## 2015-08-04 MED ORDER — METOPROLOL TARTRATE 25 MG PO TABS: 25.0000 mg | ORAL_TABLET | Freq: Two times a day (BID) | ORAL | Status: DC

## 2015-08-07 ENCOUNTER — Ambulatory Visit: Payer: Medicare Other | Admitting: Cardiology

## 2015-08-10 ENCOUNTER — Other Ambulatory Visit: Payer: Self-pay

## 2015-08-10 MED ORDER — METOPROLOL TARTRATE 25 MG PO TABS: 25.0000 mg | ORAL_TABLET | ORAL | Status: DC | PRN

## 2015-08-11 ENCOUNTER — Ambulatory Visit: Payer: Medicare Other | Admitting: Interventional Cardiology

## 2015-08-15 NOTE — Progress Notes (Signed)
Electrophysiology Office Note   Date:  08/16/2015   ID:  Zachary Montoya, DOB 1945/11/20, MRN [id number]  PCP:  Jerlyn Ly, MD  Cardiologist:  Irish Lack Primary Electrophysiologist:  Constance Haw, MD    Chief Complaint  Patient presents with  . Follow-up    discuss ablation     History of Present Illness: Zachary Montoya is a 70 y.o. male who presents today for electrophysiology evaluation.   He has a history of OSA and PAF, DM.  Episodes of AF every other day.  Longest 10 hours.  Takes metoprolol for prolonged episodes.  Uses CPAP for OSA.  He was diagnosed with atrial fibrillation potentially back in the 1980s along with his diagnosis for sleep apnea. He says that he was being evaluated for ablation at that time, but when he was started on CPAP, his symptoms of atrial fibrillation were greatly reduced. Over the last few months to a year, he has had more symptoms from his atrial fibrillation. He says that he has severe palpitations, and when he is in atrial fibrillation he tends to urinate quite a bit. He also has some fatigue issues. He otherwise feels well and is able to do all of his daily activities.   Today, he denies symptoms of palpitations, chest pain, shortness of breath, orthopnea, PND, lower extremity edema, claudication, dizziness, presyncope, syncope, bleeding, or neurologic sequela. The patient is tolerating medications without difficulties and is otherwise without complaint today.    Past Medical History  Diagnosis Date  . Sleep apnea   . PAF (paroxysmal atrial fibrillation) [facility])    Past Surgical History  Procedure Laterality Date  . No past surgeries       Current Outpatient Prescriptions  Medication Sig Dispense Refill  . aspirin 325 MG tablet Take 325 mg by mouth daily.    Marland Kitchen atorvastatin (LIPITOR) 20 MG tablet Take 20 mg by mouth daily.  3  . Calcium Carbonate-Vitamin D (CALCIUM-D PO) Take 1 tablet by mouth daily. 600 mg calcium + 800 iu vit.  d    . cetirizine (ZYRTEC) 10 MG tablet Take 10 mg by mouth daily.    . Cholecalciferol (VITAMIN D3) 2000 UNITS TABS Take 2,000 Units by mouth daily.    . Coenzyme Q10 (COQ10 PO) Take 300 mg by mouth daily.    . ferrous sulfate 325 (65 FE) MG tablet Take 325 mg by mouth daily.    Marland Kitchen ibuprofen (ADVIL,MOTRIN) 200 MG tablet Take 400 mg by mouth every 6 (six) hours as needed (for pain).    . metoprolol tartrate (LOPRESSOR) 25 MG tablet Take 25 mg by mouth 2 (two) times daily.    . Multiple Vitamins-Minerals (CENTRUM ADULTS PO) Take 1 tablet by mouth daily.    . Omega-3 Fatty Acids (OMEGA 3 PO) Take 1,280 mg by mouth daily.    Marland Kitchen omeprazole (PRILOSEC) 20 MG capsule Take 20 mg by mouth daily.  3  . Triamcinolone Acetonide (NASACORT ALLERGY 24HR NA) Place 1 drop into the nose daily.     No current facility-administered medications for this visit.    Allergies:   Timolol   Social History:  The patient  reports that he has never smoked. He has never used smokeless tobacco.   Family History:  The patient's family history includes Cancer in his brother; Diabetes in his mother; Hypertension in his mother; Stroke in his mother. There is no history of Heart attack.    ROS:  Please see the history of  present illness.   Otherwise, review of systems is positive for palpitations.   All other systems are reviewed and negative.    PHYSICAL EXAM: VS:  BP 126/82 mmHg  Pulse 56  Ht 5\' 8"  (1.727 m)  Wt 207 lb (93.895 kg)  BMI 31.48 kg/m2 , BMI Body mass index is 31.48 kg/(m^2). GEN: Well nourished, well developed, in no acute distress HEENT: normal Neck: no JVD, carotid bruits, or masses Cardiac: RRR; no murmurs, rubs, or gallops,no edema  Respiratory:  clear to auscultation bilaterally, normal work of breathing GI: soft, nontender, nondistended, + BS MS: no deformity or atrophy Skin: warm and dry Neuro:  Strength and sensation are intact Psych: euthymic mood, full affect  EKG:  EKG is ordered  today. The ekg ordered today shows sinus rhythm, rate 54  Recent Labs: No results found for requested labs within last 365 days.    Lipid Panel  No results found for: CHOL, TRIG, HDL, CHOLHDL, VLDL, LDLCALC, LDLDIRECT   Wt Readings from Last 3 Encounters:  08/16/15 207 lb (93.895 kg)  06/28/15 201 lb 1.9 oz (91.227 kg)  12/26/14 202 lb (91.627 kg)      Other studies Reviewed: Additional studies/ records that were reviewed today include: TTE 01/03/15 - Left ventricle: The cavity size was normal. There was mild  concentric hypertrophy. Systolic function was normal. The  estimated ejection fraction was in the range of 60% to 65%. Wall  motion was normal; there were no regional wall motion  abnormalities. Left ventricular diastolic function parameters  were normal. - Aortic valve: There was trivial regurgitation. - Right ventricle: The cavity size was normal. Wall thickness was  normal. Systolic function was normal. - Tricuspid valve: There was trivial regurgitation. - Pulmonic valve: There was trivial regurgitation. - Inferior vena cava: The vessel was dilated. The respirophasic  diameter changes were blunted (< 50%), consistent with elevated  central venous pressure. - Global longitudinal strain -16.3%  Telemetry 06/28/15   NSR with Paroxysmal Atrial fibrillation.  Atrial fibrillation 3% of the time.   ASSESSMENT AND PLAN:  1.  Paroxysmal atrial fibrillation: Currently not anticoagulated as he has a low stroke risk. I discussed with him the options of treatment for atrial fibrillation. We discussed ablation and medical management. I did discuss the risks and benefits of ablation, which include bleeding, tamponade, heart block, stroke, and damage to surrounding organs, among others. We discussed the risks and benefits of medical management as well including risks with amiodarone, flecainide, and dofetilide. He understands these risks, and is feeling well on his current  doses of metoprolol. He wishes to continue on the metoprolol at this time without any changes to be made. I Znya Albino see him back in 3 months to determine if any future medication changes or ablation is an option.  This patients CHA2DS2-VASc Score and unadjusted Ischemic Stroke Rate (% per year) is equal to 0.6 % stroke rate/year from a score of 1  Above score calculated as 1 point each if present [CHF, HTN, DM, Vascular=MI/PAD/Aortic Plaque, Age if 65-74, or Male] Above score calculated as 2 points each if present [Age > 75, or Stroke/TIA/TE] Cletus Gash was 45And distal patellar that she follow-up with Dr. Velva Harman for her  Current medicines are reviewed at length with the patient today.   The patient does not have concerns regarding his medicines.  The following changes were made today:  none  Labs/ tests ordered today include:  No orders of the defined types were placed in  this encounter.     Disposition:   FU with Taesha Goodell 3 months  Signed, Kim Lauver Meredith Leeds, MD  08/16/2015 9:14 AM     [facility] [address] [place] [address] [phone] (office) [phone] (fax)

## 2015-08-16 ENCOUNTER — Ambulatory Visit (INDEPENDENT_AMBULATORY_CARE_PROVIDER_SITE_OTHER): Payer: Medicare Other | Admitting: Cardiology

## 2015-08-16 ENCOUNTER — Encounter: Payer: Self-pay | Admitting: *Deleted

## 2015-08-16 ENCOUNTER — Encounter: Payer: Self-pay | Admitting: Cardiology

## 2015-08-16 VITALS — BP 126/82 | HR 56 | Ht 68.0 in | Wt 207.0 lb

## 2015-08-16 DIAGNOSIS — I480 Paroxysmal atrial fibrillation: Secondary | ICD-10-CM

## 2015-08-16 NOTE — Patient Instructions (Signed)
Medication Instructions:   Your physician recommends that you continue on your current medications as directed. Please refer to the Current Medication list given to you today.  Labwork:  none  Testing/Procedures:  none  Follow-Up:  Your physician recommends that you schedule a follow-up appointment in: 3 months.  Any Other Special Instructions Will Be Listed Below (If Applicable).  If you need a refill on your cardiac medications before your next appointment, please call your pharmacy. 

## 2015-08-22 DIAGNOSIS — L570 Actinic keratosis: Secondary | ICD-10-CM | POA: Diagnosis not present

## 2015-08-22 DIAGNOSIS — D1800 Hemangioma unspecified site: Secondary | ICD-10-CM | POA: Diagnosis not present

## 2015-08-22 DIAGNOSIS — D239 Other benign neoplasm of skin, unspecified: Secondary | ICD-10-CM | POA: Diagnosis not present

## 2015-09-27 ENCOUNTER — Other Ambulatory Visit: Payer: Self-pay

## 2015-09-27 MED ORDER — METOPROLOL TARTRATE 25 MG PO TABS: 25.0000 mg | ORAL_TABLET | Freq: Two times a day (BID) | ORAL | 1 refills | Status: DC

## 2015-10-17 DIAGNOSIS — H353121 Nonexudative age-related macular degeneration, left eye, early dry stage: Secondary | ICD-10-CM | POA: Diagnosis not present

## 2015-10-17 DIAGNOSIS — H401112 Primary open-angle glaucoma, right eye, moderate stage: Secondary | ICD-10-CM | POA: Diagnosis not present

## 2015-10-17 DIAGNOSIS — H5213 Myopia, bilateral: Secondary | ICD-10-CM | POA: Diagnosis not present

## 2015-10-17 DIAGNOSIS — H401122 Primary open-angle glaucoma, left eye, moderate stage: Secondary | ICD-10-CM | POA: Diagnosis not present

## 2015-10-17 DIAGNOSIS — H353111 Nonexudative age-related macular degeneration, right eye, early dry stage: Secondary | ICD-10-CM | POA: Diagnosis not present

## 2015-10-17 DIAGNOSIS — H52223 Regular astigmatism, bilateral: Secondary | ICD-10-CM | POA: Diagnosis not present

## 2015-10-23 DIAGNOSIS — R350 Frequency of micturition: Secondary | ICD-10-CM | POA: Insufficient documentation

## 2015-10-23 DIAGNOSIS — Z6831 Body mass index (BMI) 31.0-31.9, adult: Secondary | ICD-10-CM | POA: Diagnosis not present

## 2015-10-23 DIAGNOSIS — J302 Other seasonal allergic rhinitis: Secondary | ICD-10-CM | POA: Diagnosis not present

## 2015-10-23 DIAGNOSIS — L239 Allergic contact dermatitis, unspecified cause: Secondary | ICD-10-CM | POA: Diagnosis not present

## 2015-10-23 DIAGNOSIS — H1013 Acute atopic conjunctivitis, bilateral: Secondary | ICD-10-CM | POA: Diagnosis not present

## 2015-10-23 DIAGNOSIS — J309 Allergic rhinitis, unspecified: Secondary | ICD-10-CM | POA: Insufficient documentation

## 2015-10-23 DIAGNOSIS — R300 Dysuria: Secondary | ICD-10-CM | POA: Diagnosis not present

## 2015-11-03 ENCOUNTER — Encounter: Payer: Self-pay | Admitting: Cardiology

## 2015-11-09 DIAGNOSIS — H401231 Low-tension glaucoma, bilateral, mild stage: Secondary | ICD-10-CM | POA: Diagnosis not present

## 2015-11-09 DIAGNOSIS — H401230 Low-tension glaucoma, bilateral, stage unspecified: Secondary | ICD-10-CM | POA: Diagnosis not present

## 2015-11-19 NOTE — Progress Notes (Signed)
Electrophysiology Office Note   Date:  11/20/2015   ID:  Malachai Ingersoll, [place] 12-Jan-1946, MRN [id number]  PCP:  Jerlyn Ly, MD  Cardiologist:  Irish Lack Primary Electrophysiologist:  Constance Haw, MD    Chief Complaint  Patient presents with  . Follow-up    PAF     History of Present Illness: Zachary Montoya is a 70 y.o. male who presents today for electrophysiology evaluation.   He has a history of OSA and PAF, DM.  Episodes of AF every other day.  Longest 10 hours.  Takes metoprolol for prolonged episodes.  Uses CPAP for OSA.  He was diagnosed with atrial fibrillation potentially back in the 1980s along with his diagnosis for sleep apnea. He says that he was being evaluated for ablation at that time, but when he was started on CPAP, his symptoms of atrial fibrillation were greatly reduced. It is last visit, his metoprolol was increased. He was initially having quite a bit of fatigue, but that has improved over the last few months. He is able to work out on his elliptical 3 days a week for up to 50 minutes. He had one episode of atrial fibrillation since his medications were adjusted. The episode lasted 14 hours. He took one extra dose of metoprolol which helped improve his symptoms.  Today, he denies symptoms of palpitations, chest pain, shortness of breath, orthopnea, PND, lower extremity edema, claudication, dizziness, presyncope, syncope, bleeding, or neurologic sequela. The patient is tolerating medications without difficulties and is otherwise without complaint today.    Past Medical History:  Diagnosis Date  . PAF (paroxysmal atrial fibrillation) ([facility])   . Sleep apnea    Past Surgical History:  Procedure Laterality Date  . NO PAST SURGERIES       Current Outpatient Prescriptions  Medication Sig Dispense Refill  . aspirin 325 MG tablet Take 325 mg by mouth daily.    Marland Kitchen atorvastatin (LIPITOR) 20 MG tablet Take 20 mg by mouth daily.  3  . Calcium  Carbonate-Vitamin D (CALCIUM-D PO) Take 1 tablet by mouth daily. 600 mg calcium + 800 iu vit. d    . cetirizine (ZYRTEC) 10 MG tablet Take 10 mg by mouth 2 (two) times daily.    . Cholecalciferol (VITAMIN D3) 2000 UNITS TABS Take 2,000 Units by mouth daily.    . Coenzyme Q10 (COQ10 PO) Take 300 mg by mouth daily.    . ferrous sulfate 325 (65 FE) MG tablet Take 325 mg by mouth daily.    Marland Kitchen ibuprofen (ADVIL,MOTRIN) 200 MG tablet Take 400 mg by mouth every 6 (six) hours as needed (for pain).    . metoprolol tartrate (LOPRESSOR) 25 MG tablet Take 1 tablet (25 mg total) by mouth 2 (two) times daily. 180 tablet 1  . Multiple Vitamins-Minerals (CENTRUM ADULTS PO) Take 1 tablet by mouth daily.    . Omega-3 Fatty Acids (OMEGA 3 PO) Take 1,280 mg by mouth daily.    Marland Kitchen omeprazole (PRILOSEC) 20 MG capsule Take 20 mg by mouth daily.  3  . Triamcinolone Acetonide (NASACORT ALLERGY 24HR NA) Place 1 drop into the nose daily.     No current facility-administered medications for this visit.     Allergies:   Timolol   Social History:  The patient  reports that he has never smoked. He has never used smokeless tobacco. He reports that he does not drink alcohol or use drugs.   Family History:  The patient's family history includes Cancer  in his brother; Diabetes in his mother; Hypertension in his mother; Stroke in his mother.    ROS:  Please see the history of present illness.   Otherwise, review of systems is positive for none.   All other systems are reviewed and negative.    PHYSICAL EXAM: VS:  BP 118/78   Pulse 62   Ht 5\' 8"  (1.727 m)   Wt 207 lb (93.9 kg)   BMI 31.47 kg/m  , BMI Body mass index is 31.47 kg/m. GEN: Well nourished, well developed, in no acute distress  HEENT: normal  Neck: no JVD, carotid bruits, or masses Cardiac: RRR; no murmurs, rubs, or gallops,no edema  Respiratory:  clear to auscultation bilaterally, normal work of breathing GI: soft, nontender, nondistended, + BS MS: no  deformity or atrophy  Skin: warm and dry Neuro:  Strength and sensation are intact Psych: euthymic mood, full affect  EKG:  EKG is not ordered today. Personal review of the ECG ordered 6/21 shows sinus rhythm, rate 54  Recent Labs: No results found for requested labs within last 8760 hours.    Lipid Panel  No results found for: CHOL, TRIG, HDL, CHOLHDL, VLDL, LDLCALC, LDLDIRECT   Wt Readings from Last 3 Encounters:  11/20/15 207 lb (93.9 kg)  08/16/15 207 lb (93.9 kg)  06/28/15 201 lb 1.9 oz (91.2 kg)      Other studies Reviewed: Additional studies/ records that were reviewed today include: TTE 01/03/15 - Left ventricle: The cavity size was normal. There was mild  concentric hypertrophy. Systolic function was normal. The  estimated ejection fraction was in the range of 60% to 65%. Wall  motion was normal; there were no regional wall motion  abnormalities. Left ventricular diastolic function parameters  were normal. - Aortic valve: There was trivial regurgitation. - Right ventricle: The cavity size was normal. Wall thickness was  normal. Systolic function was normal. - Tricuspid valve: There was trivial regurgitation. - Pulmonic valve: There was trivial regurgitation. - Inferior vena cava: The vessel was dilated. The respirophasic  diameter changes were blunted (< 50%), consistent with elevated  central venous pressure. - Global longitudinal strain -16.3%  Telemetry 06/28/15   NSR with Paroxysmal Atrial fibrillation.  Atrial fibrillation 3% of the time.   ASSESSMENT AND PLAN:  1.  Paroxysmal atrial fibrillation: Currently not anticoagulated as he has a low stroke risk. I discussed with him the options of treatment for atrial fibrillation. He understands the risks and benefits of both ablation and medical management for his atrial fibrillation. At this time he does not desire to start on any antiarrhythmic medications, and does not feel that ablation is necessary.  He says that if he has more frequent atrial fibrillation episodes, that he Zachary Montoya consider either medical or ablation management.  This patients CHA2DS2-VASc Score and unadjusted Ischemic Stroke Rate (% per year) is equal to 0.6 % stroke rate/year from a score of 1  Above score calculated as 1 point each if present [CHF, HTN, DM, Vascular=MI/PAD/Aortic Plaque, Age if 65-74, or Male] Above score calculated as 2 points each if present [Age > 75, or Stroke/TIA/TE]  2. OSA: controlled with CPAP  Current medicines are reviewed at length with the patient today.   The patient does not have concerns regarding his medicines.  The following changes were made today:  none  Labs/ tests ordered today include:  No orders of the defined types were placed in this encounter.    Disposition:   FU with Zachary Montoya  Zachary Montoya 6 months  Signed, Zachary Schwer Meredith Leeds, MD  11/20/2015 9:59 AM     [facility] [address] [place] [address] [phone] (office) [phone] (fax)

## 2015-11-20 ENCOUNTER — Encounter: Payer: Self-pay | Admitting: Cardiology

## 2015-11-20 ENCOUNTER — Ambulatory Visit (INDEPENDENT_AMBULATORY_CARE_PROVIDER_SITE_OTHER): Payer: Medicare Other | Admitting: Cardiology

## 2015-11-20 VITALS — BP 118/78 | HR 62 | Ht 68.0 in | Wt 207.0 lb

## 2015-11-20 DIAGNOSIS — I480 Paroxysmal atrial fibrillation: Secondary | ICD-10-CM | POA: Diagnosis not present

## 2015-11-20 NOTE — Patient Instructions (Addendum)
Medication Instructions:  Your physician recommends that you continue on your current medications as directed. Please refer to the Current Medication list given to you today.  If you need a refill on your cardiac medications before your next appointment, please call your pharmacy.   Labwork: None ordered  Testing/Procedures: None ordered  Follow-Up: Your physician wants you to follow-up in: 6 months with Dr. Camnitz.  You will receive a reminder letter in the mail two months in advance. If you don't receive a letter, please call our office to schedule the follow-up appointment.  Thank you for choosing [facility]!!   Anyra Kaufman, RN [phone]         

## 2015-12-04 DIAGNOSIS — K5732 Diverticulitis of large intestine without perforation or abscess without bleeding: Secondary | ICD-10-CM | POA: Diagnosis not present

## 2015-12-04 DIAGNOSIS — R1032 Left lower quadrant pain: Secondary | ICD-10-CM | POA: Diagnosis not present

## 2015-12-04 DIAGNOSIS — Z23 Encounter for immunization: Secondary | ICD-10-CM | POA: Diagnosis not present

## 2015-12-05 DIAGNOSIS — H6123 Impacted cerumen, bilateral: Secondary | ICD-10-CM | POA: Diagnosis not present

## 2015-12-05 DIAGNOSIS — Z6830 Body mass index (BMI) 30.0-30.9, adult: Secondary | ICD-10-CM | POA: Diagnosis not present

## 2015-12-26 DIAGNOSIS — H6122 Impacted cerumen, left ear: Secondary | ICD-10-CM | POA: Diagnosis not present

## 2016-01-04 DIAGNOSIS — H9192 Unspecified hearing loss, left ear: Secondary | ICD-10-CM | POA: Diagnosis not present

## 2016-01-04 DIAGNOSIS — H903 Sensorineural hearing loss, bilateral: Secondary | ICD-10-CM | POA: Diagnosis not present

## 2016-01-15 DIAGNOSIS — K5901 Slow transit constipation: Secondary | ICD-10-CM | POA: Diagnosis not present

## 2016-01-15 DIAGNOSIS — K5732 Diverticulitis of large intestine without perforation or abscess without bleeding: Secondary | ICD-10-CM | POA: Diagnosis not present

## 2016-01-15 DIAGNOSIS — Z86010 Personal history of colonic polyps: Secondary | ICD-10-CM | POA: Diagnosis not present

## 2016-03-28 DIAGNOSIS — H401231 Low-tension glaucoma, bilateral, mild stage: Secondary | ICD-10-CM | POA: Diagnosis not present

## 2016-03-28 DIAGNOSIS — H2513 Age-related nuclear cataract, bilateral: Secondary | ICD-10-CM | POA: Diagnosis not present

## 2016-03-28 DIAGNOSIS — H43812 Vitreous degeneration, left eye: Secondary | ICD-10-CM | POA: Diagnosis not present

## 2016-04-03 DIAGNOSIS — L570 Actinic keratosis: Secondary | ICD-10-CM | POA: Diagnosis not present

## 2016-04-03 DIAGNOSIS — L821 Other seborrheic keratosis: Secondary | ICD-10-CM | POA: Diagnosis not present

## 2016-04-03 DIAGNOSIS — D492 Neoplasm of unspecified behavior of bone, soft tissue, and skin: Secondary | ICD-10-CM | POA: Diagnosis not present

## 2016-04-05 ENCOUNTER — Other Ambulatory Visit: Payer: Self-pay | Admitting: Interventional Cardiology

## 2016-04-09 DIAGNOSIS — K5901 Slow transit constipation: Secondary | ICD-10-CM | POA: Diagnosis not present

## 2016-04-09 DIAGNOSIS — K5732 Diverticulitis of large intestine without perforation or abscess without bleeding: Secondary | ICD-10-CM | POA: Diagnosis not present

## 2016-04-16 DIAGNOSIS — D126 Benign neoplasm of colon, unspecified: Secondary | ICD-10-CM | POA: Diagnosis not present

## 2016-04-16 DIAGNOSIS — Z86010 Personal history of colonic polyps: Secondary | ICD-10-CM | POA: Diagnosis not present

## 2016-04-18 DIAGNOSIS — D126 Benign neoplasm of colon, unspecified: Secondary | ICD-10-CM | POA: Diagnosis not present

## 2016-05-14 ENCOUNTER — Encounter: Payer: Self-pay | Admitting: Cardiology

## 2016-05-23 ENCOUNTER — Encounter: Payer: Self-pay | Admitting: Cardiology

## 2016-05-23 ENCOUNTER — Ambulatory Visit (INDEPENDENT_AMBULATORY_CARE_PROVIDER_SITE_OTHER): Payer: Medicare Other | Admitting: Cardiology

## 2016-05-23 VITALS — BP 102/74 | HR 57 | Ht 68.0 in | Wt 205.2 lb

## 2016-05-23 DIAGNOSIS — I480 Paroxysmal atrial fibrillation: Secondary | ICD-10-CM | POA: Diagnosis not present

## 2016-05-23 MED ORDER — DILTIAZEM HCL ER COATED BEADS 120 MG PO CP24: 120.0000 mg | ORAL_CAPSULE | Freq: Every day | ORAL | 3 refills | Status: DC

## 2016-05-23 NOTE — Progress Notes (Signed)
Electrophysiology Office Note   Date:  05/23/2016   ID:  Zachary Montoya, [place] 1945-12-30, MRN [id number]  PCP:  Jerlyn Ly, MD  Cardiologist:  Irish Lack Primary Electrophysiologist:  Constance Haw, MD    Chief Complaint  Patient presents with  . Follow-up    PAF     History of Present Illness: Zachary Montoya is a 71 y.o. male who presents today for electrophysiology evaluation.   He has a history of OSA and PAF, DM.  Episodes of AF every other day.  Longest 10 hours.  Takes metoprolol for prolonged episodes.  Uses CPAP for OSA.  He was diagnosed with atrial fibrillation potentially back in the 1980s along with his diagnosis for sleep apnea. He says that he was being evaluated for ablation at that time, but when he was started on CPAP, his symptoms of atrial fibrillation were greatly reduced. It is last visit, his metoprolol was increased. He was initially having quite a bit of fatigue, but that has improved over the last few months. He is able to work out on his elliptical 3 days a week for up to 50 minutes. He went into atrial fibrillation about 2:00 yesterday. He took an extra dose of metoprolol, and transitioned back to sinus rhythm approximately 2 hours ago. He says that he feels well without complaints. When he is in atrial fibrillation, he feels fatigued and has palpitations. He feels well currently.  Today, he denies symptoms of palpitations, chest pain, shortness of breath, orthopnea, PND, lower extremity edema, claudication, dizziness, presyncope, syncope, bleeding, or neurologic sequela. The patient is tolerating medications without difficulties and is otherwise without complaint today.    Past Medical History:  Diagnosis Date  . PAF (paroxysmal atrial fibrillation) ([facility])   . Sleep apnea    Past Surgical History:  Procedure Laterality Date  . NO PAST SURGERIES       Current Outpatient Prescriptions  Medication Sig Dispense Refill  . aspirin 325 MG tablet  Take 325 mg by mouth daily.    Marland Kitchen atorvastatin (LIPITOR) 20 MG tablet Take 20 mg by mouth daily.  3  . Calcium Carbonate-Vitamin D (CALCIUM-D PO) Take 1 tablet by mouth daily. 600 mg calcium + 800 iu vit. d    . cetirizine (ZYRTEC) 10 MG tablet Take 10 mg by mouth 2 (two) times daily.    . Cholecalciferol (VITAMIN D3) 2000 UNITS TABS Take 2,000 Units by mouth daily.    . Coenzyme Q10 (COQ10 PO) Take 300 mg by mouth daily.    . ferrous sulfate 325 (65 FE) MG tablet Take 325 mg by mouth daily.    Marland Kitchen ibuprofen (ADVIL,MOTRIN) 200 MG tablet Take 400 mg by mouth every 6 (six) hours as needed (for pain).    . Multiple Vitamins-Minerals (CENTRUM ADULTS PO) Take 1 tablet by mouth daily.    . Omega-3 Fatty Acids (OMEGA 3 PO) Take 1,280 mg by mouth daily.    Marland Kitchen omeprazole (PRILOSEC) 20 MG capsule Take 20 mg by mouth daily.  3  . triamcinolone (NASACORT) 55 MCG/ACT AERO nasal inhaler Place 1 spray into the nose daily.    Marland Kitchen diltiazem (CARDIZEM CD) 120 MG 24 hr capsule Take 1 capsule (120 mg total) by mouth daily. 90 capsule 3   No current facility-administered medications for this visit.     Allergies:   Timolol   Social History:  The patient  reports that he has never smoked. He has never used smokeless tobacco. He reports  that he does not drink alcohol or use drugs.   Family History:  The patient's family history includes Cancer in his brother; Diabetes in his mother; Hypertension in his mother; Stroke in his mother.    ROS:  Please see the history of present illness.   Otherwise, review of systems is positive for palpitations, hearing loss.   All other systems are reviewed and negative.    PHYSICAL EXAM: VS:  BP 102/74   Pulse (!) 57   Ht 5\' 8"  (1.727 m)   Wt 205 lb 3.2 oz (93.1 kg)   BMI 31.20 kg/m  , BMI Body mass index is 31.2 kg/m. GEN: Well nourished, well developed, in no acute distress  HEENT: normal  Neck: no JVD, carotid bruits, or masses Cardiac: RRR; no murmurs, rubs, or  gallops,no edema  Respiratory:  clear to auscultation bilaterally, normal work of breathing GI: soft, nontender, nondistended, + BS MS: no deformity or atrophy  Skin: warm and dry Neuro:  Strength and sensation are intact Psych: euthymic mood, full affect  EKG:  EKG is not ordered today. Personal review of the ECG ordered 6/21 shows sinus rhythm, rate 57, LAFB  Recent Labs: No results found for requested labs within last 8760 hours.    Lipid Panel  No results found for: CHOL, TRIG, HDL, CHOLHDL, VLDL, LDLCALC, LDLDIRECT   Wt Readings from Last 3 Encounters:  05/23/16 205 lb 3.2 oz (93.1 kg)  11/20/15 207 lb (93.9 kg)  08/16/15 207 lb (93.9 kg)      Other studies Reviewed: Additional studies/ records that were reviewed today include: TTE 01/03/15 - Left ventricle: The cavity size was normal. There was mild  concentric hypertrophy. Systolic function was normal. The  estimated ejection fraction was in the range of 60% to 65%. Wall  motion was normal; there were no regional wall motion  abnormalities. Left ventricular diastolic function parameters  were normal. - Aortic valve: There was trivial regurgitation. - Right ventricle: The cavity size was normal. Wall thickness was  normal. Systolic function was normal. - Tricuspid valve: There was trivial regurgitation. - Pulmonic valve: There was trivial regurgitation. - Inferior vena cava: The vessel was dilated. The respirophasic  diameter changes were blunted (< 50%), consistent with elevated  central venous pressure. - Global longitudinal strain -16.3%  Telemetry 06/28/15   NSR with Paroxysmal Atrial fibrillation.  Atrial fibrillation 3% of the time.   ASSESSMENT AND PLAN:  1.  Paroxysmal atrial fibrillation: Currently not anticoagulated as he has a low stroke risk. We further discussed ablation as a method of controlling his atrial fibrillation, but he feels like this is not necessary. He is having some fatigue  and is unable to exercise which he feels is due to his metoprolol. We'll stop his metoprolol and start him on 120 of diltiazem. I told him that it is okay to take an x-ray does if he goes into atrial fibrillation.  This patients CHA2DS2-VASc Score and unadjusted Ischemic Stroke Rate (% per year) is equal to 0.6 % stroke rate/year from a score of 1  Above score calculated as 1 point each if present [CHF, HTN, DM, Vascular=MI/PAD/Aortic Plaque, Age if 65-74, or Male] Above score calculated as 2 points each if present [Age > 75, or Stroke/TIA/TE]  2. OSA: controlled with CPAP  Current medicines are reviewed at length with the patient today.   The patient does not have concerns regarding his medicines.  The following changes were made today:  Stop metoprolol start  diltiazem  Labs/ tests ordered today include:  Orders Placed This Encounter  Procedures  . EKG 12-Lead     Disposition:   FU with Willean Schurman 6 months  Signed, Berlie Hatchel Meredith Leeds, MD  05/23/2016 10:14 AM     [facility] [address] [place]  [address] [phone] (office) [phone] (fax)

## 2016-05-23 NOTE — Patient Instructions (Addendum)
Medication Instructions:    Your physician has recommended you make the following change in your medication:  1) STOP Metoprolol Tartrate 2) START Diltiazem 120 mg daily  --- If you need a refill on your cardiac medications before your next appointment, please call your pharmacy. ---  Labwork:  None ordered  Testing/Procedures:  None ordered  Follow-Up:  Your physician wants you to follow-up in: 6 months with Dr. Curt Bears. You will receive a reminder letter in the mail two months in advance. If you don't receive a letter, please call our office to schedule the follow-up appointment.  Thank you for choosing [facility]!!   Trinidad Curet, RN [phone]

## 2016-06-18 DIAGNOSIS — D6489 Other specified anemias: Secondary | ICD-10-CM | POA: Diagnosis not present

## 2016-06-18 DIAGNOSIS — I480 Paroxysmal atrial fibrillation: Secondary | ICD-10-CM | POA: Diagnosis not present

## 2016-06-18 DIAGNOSIS — R7301 Impaired fasting glucose: Secondary | ICD-10-CM | POA: Diagnosis not present

## 2016-06-18 DIAGNOSIS — E781 Pure hyperglyceridemia: Secondary | ICD-10-CM | POA: Diagnosis not present

## 2016-06-18 DIAGNOSIS — Z125 Encounter for screening for malignant neoplasm of prostate: Secondary | ICD-10-CM | POA: Diagnosis not present

## 2016-06-27 DIAGNOSIS — J3089 Other allergic rhinitis: Secondary | ICD-10-CM | POA: Diagnosis not present

## 2016-06-27 DIAGNOSIS — D6489 Other specified anemias: Secondary | ICD-10-CM | POA: Diagnosis not present

## 2016-06-27 DIAGNOSIS — G588 Other specified mononeuropathies: Secondary | ICD-10-CM | POA: Diagnosis not present

## 2016-06-27 DIAGNOSIS — R001 Bradycardia, unspecified: Secondary | ICD-10-CM | POA: Diagnosis not present

## 2016-06-27 DIAGNOSIS — M542 Cervicalgia: Secondary | ICD-10-CM | POA: Diagnosis not present

## 2016-06-27 DIAGNOSIS — R51 Headache: Secondary | ICD-10-CM | POA: Diagnosis not present

## 2016-06-27 DIAGNOSIS — Z6830 Body mass index (BMI) 30.0-30.9, adult: Secondary | ICD-10-CM | POA: Diagnosis not present

## 2016-06-27 DIAGNOSIS — H1013 Acute atopic conjunctivitis, bilateral: Secondary | ICD-10-CM | POA: Diagnosis not present

## 2016-06-27 DIAGNOSIS — L2389 Allergic contact dermatitis due to other agents: Secondary | ICD-10-CM | POA: Diagnosis not present

## 2016-06-27 DIAGNOSIS — I480 Paroxysmal atrial fibrillation: Secondary | ICD-10-CM | POA: Diagnosis not present

## 2016-06-27 DIAGNOSIS — Z1389 Encounter for screening for other disorder: Secondary | ICD-10-CM | POA: Diagnosis not present

## 2016-06-27 DIAGNOSIS — Z0000 Encounter for general adult medical examination without abnormal findings: Secondary | ICD-10-CM | POA: Diagnosis not present

## 2016-08-13 DIAGNOSIS — H25813 Combined forms of age-related cataract, bilateral: Secondary | ICD-10-CM | POA: Diagnosis not present

## 2016-08-13 DIAGNOSIS — H5213 Myopia, bilateral: Secondary | ICD-10-CM | POA: Diagnosis not present

## 2016-08-13 DIAGNOSIS — H35341 Macular cyst, hole, or pseudohole, right eye: Secondary | ICD-10-CM | POA: Diagnosis not present

## 2016-08-13 DIAGNOSIS — H401111 Primary open-angle glaucoma, right eye, mild stage: Secondary | ICD-10-CM | POA: Diagnosis not present

## 2016-08-13 DIAGNOSIS — H52223 Regular astigmatism, bilateral: Secondary | ICD-10-CM | POA: Diagnosis not present

## 2016-08-13 DIAGNOSIS — H401121 Primary open-angle glaucoma, left eye, mild stage: Secondary | ICD-10-CM | POA: Diagnosis not present

## 2016-08-13 DIAGNOSIS — H5340 Unspecified visual field defects: Secondary | ICD-10-CM | POA: Diagnosis not present

## 2016-08-13 DIAGNOSIS — H401131 Primary open-angle glaucoma, bilateral, mild stage: Secondary | ICD-10-CM | POA: Diagnosis not present

## 2016-08-15 DIAGNOSIS — L821 Other seborrheic keratosis: Secondary | ICD-10-CM | POA: Diagnosis not present

## 2016-08-15 DIAGNOSIS — C44622 Squamous cell carcinoma of skin of right upper limb, including shoulder: Secondary | ICD-10-CM | POA: Diagnosis not present

## 2016-08-15 DIAGNOSIS — L570 Actinic keratosis: Secondary | ICD-10-CM | POA: Diagnosis not present

## 2016-08-15 DIAGNOSIS — D492 Neoplasm of unspecified behavior of bone, soft tissue, and skin: Secondary | ICD-10-CM | POA: Diagnosis not present

## 2016-09-03 ENCOUNTER — Encounter: Payer: Self-pay | Admitting: *Deleted

## 2016-09-03 ENCOUNTER — Telehealth: Payer: Self-pay | Admitting: Cardiology
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
## 2016-09-03 NOTE — Telephone Encounter (Signed)
New Message     Pt states he was in active AFIB 6 hours ago he is not having any symptoms as of now, pt set up appt for 7/19 230p

## 2016-09-05 NOTE — Telephone Encounter (Signed)
Should have 30 mg diltiazem as needed for rapid atrial fibrillation.

## 2016-09-06 MED ORDER — DILTIAZEM HCL 30 MG PO TABS: 30.0000 mg | ORAL_TABLET | Freq: Four times a day (QID) | ORAL | 2 refills | Status: DC | PRN

## 2016-09-06 NOTE — Telephone Encounter (Signed)
Informed pt he may take Diltiazem 30 mg tab every 6 hr PRN for breakthrough afib. He is agreeable to call office if this does not work or episodes becomes more frequent in occurrence.

## 2016-09-11 DIAGNOSIS — H401131 Primary open-angle glaucoma, bilateral, mild stage: Secondary | ICD-10-CM | POA: Diagnosis not present

## 2016-09-11 DIAGNOSIS — H401121 Primary open-angle glaucoma, left eye, mild stage: Secondary | ICD-10-CM | POA: Diagnosis not present

## 2016-09-11 DIAGNOSIS — H401111 Primary open-angle glaucoma, right eye, mild stage: Secondary | ICD-10-CM | POA: Diagnosis not present

## 2016-09-12 ENCOUNTER — Ambulatory Visit: Payer: Medicare Other | Admitting: Cardiology

## 2016-09-25 DIAGNOSIS — D229 Melanocytic nevi, unspecified: Secondary | ICD-10-CM | POA: Diagnosis not present

## 2016-09-25 DIAGNOSIS — L820 Inflamed seborrheic keratosis: Secondary | ICD-10-CM | POA: Diagnosis not present

## 2016-09-25 DIAGNOSIS — D492 Neoplasm of unspecified behavior of bone, soft tissue, and skin: Secondary | ICD-10-CM | POA: Diagnosis not present

## 2016-10-02 DIAGNOSIS — Z5189 Encounter for other specified aftercare: Secondary | ICD-10-CM | POA: Diagnosis not present

## 2016-10-17 DIAGNOSIS — H43813 Vitreous degeneration, bilateral: Secondary | ICD-10-CM | POA: Diagnosis not present

## 2016-10-17 DIAGNOSIS — H401231 Low-tension glaucoma, bilateral, mild stage: Secondary | ICD-10-CM | POA: Diagnosis not present

## 2016-10-17 DIAGNOSIS — H2513 Age-related nuclear cataract, bilateral: Secondary | ICD-10-CM | POA: Diagnosis not present

## 2016-10-24 DIAGNOSIS — Z6831 Body mass index (BMI) 31.0-31.9, adult: Secondary | ICD-10-CM | POA: Diagnosis not present

## 2016-10-24 DIAGNOSIS — Z23 Encounter for immunization: Secondary | ICD-10-CM | POA: Diagnosis not present

## 2016-10-24 DIAGNOSIS — G4733 Obstructive sleep apnea (adult) (pediatric): Secondary | ICD-10-CM | POA: Diagnosis not present

## 2016-11-14 ENCOUNTER — Encounter: Payer: Self-pay | Admitting: Cardiology

## 2016-11-14 ENCOUNTER — Ambulatory Visit (INDEPENDENT_AMBULATORY_CARE_PROVIDER_SITE_OTHER): Payer: Medicare Other | Admitting: Cardiology

## 2016-11-14 VITALS — BP 136/74 | HR 63 | Ht 68.0 in | Wt 203.8 lb

## 2016-11-14 DIAGNOSIS — I480 Paroxysmal atrial fibrillation: Secondary | ICD-10-CM | POA: Diagnosis not present

## 2016-11-14 DIAGNOSIS — G4733 Obstructive sleep apnea (adult) (pediatric): Secondary | ICD-10-CM

## 2016-11-14 MED ORDER — ASPIRIN EC 81 MG PO TBEC: 81.0000 mg | DELAYED_RELEASE_TABLET | Freq: Every day | ORAL | 3 refills | Status: DC

## 2016-11-14 NOTE — Progress Notes (Signed)
Electrophysiology Office Note   Date:  11/14/2016   ID:  Zachary Montoya, [place] [birth year], MRN [id number]  PCP:  Zachary Infante, MD  Cardiologist:  Zachary Montoya Primary Electrophysiologist:  Zachary Haw, MD    Chief Complaint  Patient presents with  . Follow-up    PAF     History of Present Illness: Zachary Montoya is a 71 y.o. male who presents today for electrophysiology evaluation.   He has a history of OSA and PAF, DM.  Episodes of AF every other day.  Longest 10 hours.  Takes metoprolol for prolonged episodes.  Uses CPAP for OSA.  He was diagnosed with atrial fibrillation potentially back in the 1980s along with his diagnosis for sleep apnea. He says that he was being evaluated for ablation at that time, but when he was started on CPAP, his symptoms of atrial fibrillation were greatly reduced.   Today, denies symptoms of palpitations, chest pain, shortness of breath, orthopnea, PND, lower extremity edema, claudication, dizziness, presyncope, syncope, bleeding, or neurologic sequela. The patient is tolerating medications without difficulties. He has episodes of atrial fibrillation once every 2 months. Episodes last for approximately 18 or so hours. He otherwise has felt well without major issue.    Past Medical History:  Diagnosis Date  . PAF (paroxysmal atrial fibrillation) ([place])   . Sleep apnea    Past Surgical History:  Procedure Laterality Date  . NO PAST SURGERIES       Current Outpatient Prescriptions  Medication Sig Dispense Refill  . amoxicillin-clavulanate (AUGMENTIN) 875-125 MG tablet Take 1 tablet by mouth as needed (diverticulitis).    Marland Kitchen atorvastatin (LIPITOR) 20 MG tablet Take 20 mg by mouth daily.  3  . Calcium Carbonate-Vitamin D (CALCIUM-D PO) Take 1 tablet by mouth daily. 600 mg calcium + 800 iu vit. d    . cetirizine (ZYRTEC) 10 MG tablet Take 10 mg by mouth 2 (two) times daily.    . Cholecalciferol (VITAMIN D3) 2000 UNITS TABS Take 2,000 Units by  mouth daily.    . Coenzyme Q10 (COQ10 PO) Take 300 mg by mouth daily.    Marland Kitchen diltiazem (CARDIZEM CD) 120 MG 24 hr capsule Take 1 capsule (120 mg total) by mouth daily. 90 capsule 3  . diltiazem (CARDIZEM) 30 MG tablet Take 1 tablet (30 mg total) by mouth every 6 (six) hours as needed. For AFib 30 tablet 2  . ferrous sulfate 325 (65 FE) MG tablet Take 325 mg by mouth daily.    Marland Kitchen ibuprofen (ADVIL,MOTRIN) 200 MG tablet Take 400 mg by mouth every 6 (six) hours as needed (for pain).    . Multiple Vitamins-Minerals (CENTRUM ADULTS PO) Take 1 tablet by mouth daily.    . Omega-3 Fatty Acids (OMEGA 3 PO) Take 1,280 mg by mouth daily.    Marland Kitchen omeprazole (PRILOSEC) 20 MG capsule Take 20 mg by mouth daily.  3  . triamcinolone (NASACORT) 55 MCG/ACT AERO nasal inhaler Place 1 spray into the nose daily.     No current facility-administered medications for this visit.     Allergies:   Timolol   Social History:  The patient  reports that he has never smoked. He has never used smokeless tobacco. He reports that he does not drink alcohol or use drugs.   Family History:  The patient's family history includes Cancer in his brother; Diabetes in his mother; Hypertension in his mother; Stroke in his mother.    ROS:  Please see the history  of present illness.   Otherwise, review of systems is positive for None.   All other systems are reviewed and negative.   PHYSICAL EXAM: VS:  BP 136/74   Pulse 63   Ht 5\' 8"  (1.727 m)   Wt 203 lb 12.8 oz (92.4 kg)   SpO2 96%   BMI 30.99 kg/m  , BMI Body mass index is 30.99 kg/m. GEN: Well nourished, well developed, in no acute distress  HEENT: normal  Neck: no JVD, carotid bruits, or masses Cardiac: RRR; no murmurs, rubs, or gallops,no edema  Respiratory:  clear to auscultation bilaterally, normal work of breathing GI: soft, nontender, nondistended, + BS MS: no deformity or atrophy  Skin: warm and dry Neuro:  Strength and sensation are intact Psych: euthymic mood, full  affect  EKG:  EKG is not ordered today. Personal review of the ekg ordered 05/23/16 shows sinus rhythm, left anterior fascicular block, rate 57   Recent Labs: No results found for requested labs within last 8760 hours.    Lipid Panel  No results found for: CHOL, TRIG, HDL, CHOLHDL, VLDL, LDLCALC, LDLDIRECT   Wt Readings from Last 3 Encounters:  11/14/16 203 lb 12.8 oz (92.4 kg)  05/23/16 205 lb 3.2 oz (93.1 kg)  11/20/15 207 lb (93.9 kg)      Other studies Reviewed: Additional studies/ records that were reviewed today include: TTE 01/03/15 - Left ventricle: The cavity size was normal. There was mild  concentric hypertrophy. Systolic function was normal. The  estimated ejection fraction was in the range of 60% to 65%. Wall  motion was normal; there were no regional wall motion  abnormalities. Left ventricular diastolic function parameters  were normal. - Aortic valve: There was trivial regurgitation. - Right ventricle: The cavity size was normal. Wall thickness was  normal. Systolic function was normal. - Tricuspid valve: There was trivial regurgitation. - Pulmonic valve: There was trivial regurgitation. - Inferior vena cava: The vessel was dilated. The respirophasic  diameter changes were blunted (< 50%), consistent with elevated  central venous pressure. - Global longitudinal strain -16.3%  Telemetry 06/28/15   NSR with Paroxysmal Atrial fibrillation.  Atrial fibrillation 3% of the time.   ASSESSMENT AND PLAN:  1.  Paroxysmal atrial fibrillation: Continues to have minimal symptoms from his atrial fibrillation. We did discuss the possibility of antiarrhythmic therapy versus ablation in the future. He does not feel that his symptoms warrant therapy at this time. He is currently on aspirin 325 mg. I have told him that it is safe to decrease to 81 mg today. He may wish to stop his aspirin in the future, which I told him is an acceptable plan.  This patients  CHA2DS2-VASc Score and unadjusted Ischemic Stroke Rate (% per year) is equal to 0.6 % stroke rate/year from a score of 1  Above score calculated as 1 point each if present [CHF, HTN, DM, Vascular=MI/PAD/Aortic Plaque, Age if 65-74, or Male] Above score calculated as 2 points each if present [Age > 75, or Stroke/TIA/TE]   2. OSA: Well-controlled with CPAP  Current medicines are reviewed at length with the patient today.   The patient does not have concerns regarding his medicines.  The following changes were made today:  Decrease aspirin  Labs/ tests ordered today include:  No orders of the defined types were placed in this encounter.    Disposition:   FU with Demetres Prochnow 6 months  Signed, Taiten Brawn Meredith Leeds, MD  11/14/2016 3:20 PM  [address] [place] [address] [phone] (office) [phone] (fax)

## 2016-11-14 NOTE — Patient Instructions (Signed)
Medication Instructions:  Your physician has recommended you make the following change in your medication:  1. DECREASE Aspirin to 81 mg daily  -- If you need a refill on your cardiac medications before your next appointment, please call your pharmacy. --  Labwork: None ordered  Testing/Procedures: None ordered  Follow-Up: Your physician wants you to follow-up in: 6 months with Dr. Curt Bears.  You will receive a reminder letter in the mail two months in advance. If you don't receive a letter, please call our office to schedule the follow-up appointment.  Thank you for choosing [facility]!!   Trinidad Curet, RN [phone]

## 2016-11-29 DIAGNOSIS — M792 Neuralgia and neuritis, unspecified: Secondary | ICD-10-CM | POA: Diagnosis not present

## 2016-11-29 DIAGNOSIS — Z6830 Body mass index (BMI) 30.0-30.9, adult: Secondary | ICD-10-CM | POA: Diagnosis not present

## 2016-12-18 DIAGNOSIS — H401190 Primary open-angle glaucoma, unspecified eye, stage unspecified: Secondary | ICD-10-CM | POA: Insufficient documentation

## 2016-12-18 DIAGNOSIS — H401122 Primary open-angle glaucoma, left eye, moderate stage: Secondary | ICD-10-CM | POA: Diagnosis not present

## 2016-12-18 DIAGNOSIS — H401113 Primary open-angle glaucoma, right eye, severe stage: Secondary | ICD-10-CM | POA: Insufficient documentation

## 2016-12-19 DIAGNOSIS — H401122 Primary open-angle glaucoma, left eye, moderate stage: Secondary | ICD-10-CM | POA: Diagnosis not present

## 2016-12-19 DIAGNOSIS — H401113 Primary open-angle glaucoma, right eye, severe stage: Secondary | ICD-10-CM | POA: Diagnosis not present

## 2017-01-10 DIAGNOSIS — R0683 Snoring: Secondary | ICD-10-CM | POA: Diagnosis not present

## 2017-01-10 DIAGNOSIS — G4733 Obstructive sleep apnea (adult) (pediatric): Secondary | ICD-10-CM | POA: Diagnosis not present

## 2017-01-10 DIAGNOSIS — G4719 Other hypersomnia: Secondary | ICD-10-CM | POA: Diagnosis not present

## 2017-01-21 DIAGNOSIS — H401113 Primary open-angle glaucoma, right eye, severe stage: Secondary | ICD-10-CM | POA: Diagnosis not present

## 2017-01-21 DIAGNOSIS — H401122 Primary open-angle glaucoma, left eye, moderate stage: Secondary | ICD-10-CM | POA: Diagnosis not present

## 2017-03-20 DIAGNOSIS — H401122 Primary open-angle glaucoma, left eye, moderate stage: Secondary | ICD-10-CM | POA: Diagnosis not present

## 2017-03-20 DIAGNOSIS — H401113 Primary open-angle glaucoma, right eye, severe stage: Secondary | ICD-10-CM | POA: Diagnosis not present

## 2017-04-10 DIAGNOSIS — H25813 Combined forms of age-related cataract, bilateral: Secondary | ICD-10-CM | POA: Diagnosis not present

## 2017-04-10 DIAGNOSIS — H5213 Myopia, bilateral: Secondary | ICD-10-CM | POA: Diagnosis not present

## 2017-04-10 DIAGNOSIS — H35363 Drusen (degenerative) of macula, bilateral: Secondary | ICD-10-CM | POA: Diagnosis not present

## 2017-04-10 DIAGNOSIS — H52223 Regular astigmatism, bilateral: Secondary | ICD-10-CM | POA: Diagnosis not present

## 2017-04-10 DIAGNOSIS — H524 Presbyopia: Secondary | ICD-10-CM | POA: Diagnosis not present

## 2017-04-10 DIAGNOSIS — H401223 Low-tension glaucoma, left eye, severe stage: Secondary | ICD-10-CM | POA: Diagnosis not present

## 2017-05-06 ENCOUNTER — Other Ambulatory Visit: Payer: Self-pay | Admitting: Cardiology

## 2017-05-09 DIAGNOSIS — H6123 Impacted cerumen, bilateral: Secondary | ICD-10-CM | POA: Diagnosis not present

## 2017-07-31 DIAGNOSIS — H401122 Primary open-angle glaucoma, left eye, moderate stage: Secondary | ICD-10-CM | POA: Diagnosis not present

## 2017-07-31 DIAGNOSIS — H401113 Primary open-angle glaucoma, right eye, severe stage: Secondary | ICD-10-CM | POA: Diagnosis not present

## 2017-08-25 DIAGNOSIS — E781 Pure hyperglyceridemia: Secondary | ICD-10-CM | POA: Diagnosis not present

## 2017-08-25 DIAGNOSIS — R7301 Impaired fasting glucose: Secondary | ICD-10-CM | POA: Diagnosis not present

## 2017-08-25 DIAGNOSIS — Z125 Encounter for screening for malignant neoplasm of prostate: Secondary | ICD-10-CM | POA: Diagnosis not present

## 2017-08-25 DIAGNOSIS — R82998 Other abnormal findings in urine: Secondary | ICD-10-CM | POA: Diagnosis not present

## 2017-09-08 DIAGNOSIS — Z6830 Body mass index (BMI) 30.0-30.9, adult: Secondary | ICD-10-CM | POA: Diagnosis not present

## 2017-09-08 DIAGNOSIS — Z0000 Encounter for general adult medical examination without abnormal findings: Secondary | ICD-10-CM | POA: Diagnosis not present

## 2017-09-08 DIAGNOSIS — I480 Paroxysmal atrial fibrillation: Secondary | ICD-10-CM | POA: Diagnosis not present

## 2017-09-08 DIAGNOSIS — E781 Pure hyperglyceridemia: Secondary | ICD-10-CM | POA: Diagnosis not present

## 2017-09-08 DIAGNOSIS — Z1389 Encounter for screening for other disorder: Secondary | ICD-10-CM | POA: Diagnosis not present

## 2017-09-08 DIAGNOSIS — M792 Neuralgia and neuritis, unspecified: Secondary | ICD-10-CM | POA: Diagnosis not present

## 2017-09-08 DIAGNOSIS — L239 Allergic contact dermatitis, unspecified cause: Secondary | ICD-10-CM | POA: Diagnosis not present

## 2017-09-08 DIAGNOSIS — G589 Mononeuropathy, unspecified: Secondary | ICD-10-CM | POA: Diagnosis not present

## 2017-09-08 DIAGNOSIS — R350 Frequency of micturition: Secondary | ICD-10-CM | POA: Diagnosis not present

## 2017-09-08 DIAGNOSIS — R51 Headache: Secondary | ICD-10-CM | POA: Diagnosis not present

## 2017-09-08 DIAGNOSIS — M542 Cervicalgia: Secondary | ICD-10-CM | POA: Diagnosis not present

## 2017-09-08 DIAGNOSIS — R7301 Impaired fasting glucose: Secondary | ICD-10-CM | POA: Diagnosis not present

## 2017-09-16 DIAGNOSIS — D229 Melanocytic nevi, unspecified: Secondary | ICD-10-CM | POA: Diagnosis not present

## 2017-09-16 DIAGNOSIS — L570 Actinic keratosis: Secondary | ICD-10-CM | POA: Diagnosis not present

## 2017-09-16 DIAGNOSIS — L309 Dermatitis, unspecified: Secondary | ICD-10-CM | POA: Diagnosis not present

## 2017-09-16 DIAGNOSIS — D485 Neoplasm of uncertain behavior of skin: Secondary | ICD-10-CM | POA: Diagnosis not present

## 2017-09-16 DIAGNOSIS — D225 Melanocytic nevi of trunk: Secondary | ICD-10-CM | POA: Diagnosis not present

## 2017-09-17 DIAGNOSIS — H401122 Primary open-angle glaucoma, left eye, moderate stage: Secondary | ICD-10-CM | POA: Diagnosis not present

## 2017-09-17 DIAGNOSIS — H401113 Primary open-angle glaucoma, right eye, severe stage: Secondary | ICD-10-CM | POA: Diagnosis not present

## 2017-09-18 DIAGNOSIS — H903 Sensorineural hearing loss, bilateral: Secondary | ICD-10-CM

## 2017-09-18 DIAGNOSIS — H6123 Impacted cerumen, bilateral: Secondary | ICD-10-CM | POA: Diagnosis not present

## 2017-09-18 DIAGNOSIS — H905 Unspecified sensorineural hearing loss: Secondary | ICD-10-CM | POA: Insufficient documentation

## 2017-09-18 HISTORY — DX: Sensorineural hearing loss, bilateral: H90.3

## 2017-09-21 DIAGNOSIS — L02413 Cutaneous abscess of right upper limb: Secondary | ICD-10-CM | POA: Diagnosis not present

## 2017-09-21 DIAGNOSIS — L02212 Cutaneous abscess of back [any part, except buttock]: Secondary | ICD-10-CM | POA: Diagnosis not present

## 2017-10-27 ENCOUNTER — Other Ambulatory Visit: Payer: Self-pay | Admitting: Cardiology

## 2017-10-31 DIAGNOSIS — H401122 Primary open-angle glaucoma, left eye, moderate stage: Secondary | ICD-10-CM | POA: Diagnosis not present

## 2017-10-31 DIAGNOSIS — H401113 Primary open-angle glaucoma, right eye, severe stage: Secondary | ICD-10-CM | POA: Diagnosis not present

## 2017-11-03 DIAGNOSIS — H401113 Primary open-angle glaucoma, right eye, severe stage: Secondary | ICD-10-CM | POA: Diagnosis not present

## 2017-11-03 DIAGNOSIS — H401122 Primary open-angle glaucoma, left eye, moderate stage: Secondary | ICD-10-CM | POA: Diagnosis not present

## 2017-11-19 DIAGNOSIS — D485 Neoplasm of uncertain behavior of skin: Secondary | ICD-10-CM | POA: Diagnosis not present

## 2017-11-19 DIAGNOSIS — C44529 Squamous cell carcinoma of skin of other part of trunk: Secondary | ICD-10-CM | POA: Diagnosis not present

## 2017-11-19 DIAGNOSIS — C44722 Squamous cell carcinoma of skin of right lower limb, including hip: Secondary | ICD-10-CM | POA: Diagnosis not present

## 2017-11-19 DIAGNOSIS — L820 Inflamed seborrheic keratosis: Secondary | ICD-10-CM | POA: Diagnosis not present

## 2017-11-19 DIAGNOSIS — L570 Actinic keratosis: Secondary | ICD-10-CM | POA: Diagnosis not present

## 2017-12-02 DIAGNOSIS — Z23 Encounter for immunization: Secondary | ICD-10-CM | POA: Diagnosis not present

## 2017-12-08 ENCOUNTER — Other Ambulatory Visit: Payer: Self-pay

## 2017-12-08 MED ORDER — DILTIAZEM HCL ER COATED BEADS 120 MG PO CP24: 120.0000 mg | ORAL_CAPSULE | Freq: Every day | ORAL | 3 refills | Status: DC

## 2017-12-16 ENCOUNTER — Encounter: Payer: Self-pay | Admitting: Cardiology

## 2017-12-16 ENCOUNTER — Ambulatory Visit (INDEPENDENT_AMBULATORY_CARE_PROVIDER_SITE_OTHER): Payer: Medicare Other | Admitting: Cardiology

## 2017-12-16 VITALS — BP 130/64 | HR 59 | Ht 68.0 in | Wt 203.0 lb

## 2017-12-16 DIAGNOSIS — I480 Paroxysmal atrial fibrillation: Secondary | ICD-10-CM | POA: Diagnosis not present

## 2017-12-16 DIAGNOSIS — G4733 Obstructive sleep apnea (adult) (pediatric): Secondary | ICD-10-CM

## 2017-12-16 MED ORDER — DILTIAZEM HCL ER COATED BEADS 120 MG PO CP24: 120.0000 mg | ORAL_CAPSULE | Freq: Every day | ORAL | 2 refills | Status: DC

## 2017-12-16 MED ORDER — DILTIAZEM HCL 30 MG PO TABS: 30.0000 mg | ORAL_TABLET | Freq: Four times a day (QID) | ORAL | 0 refills | Status: DC | PRN

## 2017-12-16 NOTE — Patient Instructions (Signed)
Medication Instructions:  Your physician recommends that you continue on your current medications as directed. Please refer to the Current Medication list given to you today  If you need a refill on your cardiac medications before your next appointment, please call your pharmacy.   Lab work: None ordered  Testing/Procedures: None ordered   Follow-Up: At [facility], you and your health needs are our priority.  As part of our continuing mission to provide you with exceptional heart care, we have created designated Provider Care Teams.  These Care Teams include your primary Cardiologist (physician) and Advanced Practice Providers (APPs -  Physician Assistants and Nurse Practitioners) who all work together to provide you with the care you need, when you need it. You will need a follow up appointment in 6 months.  Please call our office 2 months in advance to schedule this appointment.  You may see Will Martin Camnitz, MD or one of the following Advanced Practice Providers on your designated Care Team:   Amber Seiler, NP . Renee Ursuy, PA-C  Thank you for choosing [facility]!!   Leverne Amrhein, RN [phone]      

## 2017-12-16 NOTE — Progress Notes (Signed)
Electrophysiology Office Note   Date:  12/16/2017   ID:  Zachary Montoya, DOB 1945/05/15, MRN [id number]  PCP:  Crist Infante, MD  Cardiologist:  Irish Lack Primary Electrophysiologist:  Constance Haw, MD    No chief complaint on file.    History of Present Illness: Zachary Montoya is a 72 y.o. male who presents today for electrophysiology evaluation.   He has a history of OSA and PAF, DM.  Episodes of AF every other day.  Longest 10 hours.  Takes metoprolol for prolonged episodes.  Uses CPAP for OSA.  He was diagnosed with atrial fibrillation potentially back in the 1980s along with his diagnosis for sleep apnea. He says that he was being evaluated for ablation at that time, but when he was started on CPAP, his symptoms of atrial fibrillation were greatly reduced.   Today, denies symptoms of palpitations, chest pain, shortness of breath, orthopnea, PND, lower extremity edema, claudication, dizziness, presyncope, syncope, bleeding, or neurologic sequela. The patient is tolerating medications without difficulties.  Overall he is feeling well.  He continues to have episodes of atrial fibrillation that occur once a month and last between 12 and 18 hours.  There are no exacerbating or alleviating factors.  He has had to take intermittently his PRN diltiazem.   Past Medical History:  Diagnosis Date  . PAF (paroxysmal atrial fibrillation) ([place])   . Sleep apnea    Past Surgical History:  Procedure Laterality Date  . NO PAST SURGERIES       Current Outpatient Medications  Medication Sig Dispense Refill  . atorvastatin (LIPITOR) 20 MG tablet Take 20 mg by mouth daily.  3  . Calcium Carbonate-Vitamin D (CALCIUM-D PO) Take 1 tablet by mouth daily. 600 mg calcium + 800 iu vit. d    . Cholecalciferol (VITAMIN D3) 2000 UNITS TABS Take 2,000 Units by mouth daily.    . Coenzyme Q10 (COQ10 PO) Take 300 mg by mouth daily.    Marland Kitchen diltiazem (CARTIA XT) 120 MG 24 hr capsule Take 1 capsule  (120 mg total) by mouth daily. 30 capsule 3  . ferrous sulfate 325 (65 FE) MG tablet Take 325 mg by mouth daily.    . Multiple Vitamins-Minerals (CENTRUM ADULTS PO) Take 1 tablet by mouth daily.    . Omega-3 Fatty Acids (OMEGA 3 PO) Take 1,280 mg by mouth daily.    Marland Kitchen omeprazole (PRILOSEC) 20 MG capsule Take 20 mg by mouth daily.  3  . cetirizine (ZYRTEC) 10 MG tablet Take 10 mg by mouth daily as needed for allergies or rhinitis.     Marland Kitchen diltiazem (CARDIZEM) 30 MG tablet Take 1 tablet (30 mg total) by mouth every 6 (six) hours as needed. For AFib (Patient not taking: Reported on 12/16/2017) 30 tablet 2  . ibuprofen (ADVIL,MOTRIN) 200 MG tablet Take 400 mg by mouth every 6 (six) hours as needed (for pain).    . triamcinolone (NASACORT) 55 MCG/ACT AERO nasal inhaler Place 1 spray into the nose daily as needed (allergy season).      No current facility-administered medications for this visit.     Allergies:   Timolol   Social History:  The patient  reports that he has never smoked. He has never used smokeless tobacco. He reports that he does not drink alcohol or use drugs.   Family History:  The patient's family history includes Cancer in his brother; Diabetes in his mother; Hypertension in his mother; Stroke in his mother.  ROS:  Please see the history of present illness.   Otherwise, review of systems is positive for none.   All other systems are reviewed and negative.   PHYSICAL EXAM: VS:  BP 130/64   Pulse (!) 59   Ht 5\' 8"  (1.727 m)   Wt 203 lb (92.1 kg)   BMI 30.87 kg/m  , BMI Body mass index is 30.87 kg/m. GEN: Well nourished, well developed, in no acute distress  HEENT: normal  Neck: no JVD, carotid bruits, or masses Cardiac: RRR; no murmurs, rubs, or gallops,no edema  Respiratory:  clear to auscultation bilaterally, normal work of breathing GI: soft, nontender, nondistended, + BS MS: no deformity or atrophy  Skin: warm and dry Neuro:  Strength and sensation are  intact Psych: euthymic mood, full affect  EKG:  EKG is ordered today. Personal review of the ekg ordered shows sinus rhythm, rate 59  Recent Labs: No results found for requested labs within last 8760 hours.    Lipid Panel  No results found for: CHOL, TRIG, HDL, CHOLHDL, VLDL, LDLCALC, LDLDIRECT   Wt Readings from Last 3 Encounters:  12/16/17 203 lb (92.1 kg)  11/14/16 203 lb 12.8 oz (92.4 kg)  05/23/16 205 lb 3.2 oz (93.1 kg)      Other studies Reviewed: Additional studies/ records that were reviewed today include: TTE 01/03/15 - Left ventricle: The cavity size was normal. There was mild  concentric hypertrophy. Systolic function was normal. The  estimated ejection fraction was in the range of 60% to 65%. Wall  motion was normal; there were no regional wall motion  abnormalities. Left ventricular diastolic function parameters  were normal. - Aortic valve: There was trivial regurgitation. - Right ventricle: The cavity size was normal. Wall thickness was  normal. Systolic function was normal. - Tricuspid valve: There was trivial regurgitation. - Pulmonic valve: There was trivial regurgitation. - Inferior vena cava: The vessel was dilated. The respirophasic  diameter changes were blunted (< 50%), consistent with elevated  central venous pressure. - Global longitudinal strain -16.3%  Telemetry 06/28/15   NSR with Paroxysmal Atrial fibrillation.  Atrial fibrillation 3% of the time.   ASSESSMENT AND PLAN:  1.  Paroxysmal atrial fibrillation: He continues to have minimal symptoms from his atrial fibrillation, 12 to 18 hours once a month.  He is not anticoagulated due to a low stroke risk.  I did discuss with him options of medications versus ablation for therapy of his atrial fibrillation and he would like to hold off for now.  We also discussed his risk of stroke and potential need for anticoagulation.  At this point he would not want to be anticoagulated, but he is  aware that as he ages he Donaciano Range likely need blood thinners.  This patients CHA2DS2-VASc Score and unadjusted Ischemic Stroke Rate (% per year) is equal to 0.6 % stroke rate/year from a score of 1  Above score calculated as 1 point each if present [CHF, HTN, DM, Vascular=MI/PAD/Aortic Plaque, Age if 65-74, or Male] Above score calculated as 2 points each if present [Age > 75, or Stroke/TIA/TE]   2. OSA: Well-controlled with CPAP  Current medicines are reviewed at length with the patient today.   The patient does not have concerns regarding his medicines.  The following changes were made today: None  Labs/ tests ordered today include:  Orders Placed This Encounter  Procedures  . EKG 12-Lead     Disposition:   FU with Charles Niese 6 months  Signed, Fergie Sherbert Meredith Leeds, MD  12/16/2017 4:32 PM     [place] [address] [phone] (office) [phone] (fax)

## 2017-12-25 DIAGNOSIS — C44529 Squamous cell carcinoma of skin of other part of trunk: Secondary | ICD-10-CM | POA: Diagnosis not present

## 2018-01-01 DIAGNOSIS — Z9989 Dependence on other enabling machines and devices: Secondary | ICD-10-CM | POA: Diagnosis not present

## 2018-01-01 DIAGNOSIS — G4733 Obstructive sleep apnea (adult) (pediatric): Secondary | ICD-10-CM | POA: Diagnosis not present

## 2018-01-08 DIAGNOSIS — H25813 Combined forms of age-related cataract, bilateral: Secondary | ICD-10-CM | POA: Diagnosis not present

## 2018-01-08 DIAGNOSIS — H1851 Endothelial corneal dystrophy: Secondary | ICD-10-CM | POA: Diagnosis not present

## 2018-01-08 DIAGNOSIS — H401233 Low-tension glaucoma, bilateral, severe stage: Secondary | ICD-10-CM | POA: Diagnosis not present

## 2018-01-08 DIAGNOSIS — H5213 Myopia, bilateral: Secondary | ICD-10-CM | POA: Diagnosis not present

## 2018-03-09 DIAGNOSIS — H401113 Primary open-angle glaucoma, right eye, severe stage: Secondary | ICD-10-CM | POA: Diagnosis not present

## 2018-03-09 DIAGNOSIS — H401122 Primary open-angle glaucoma, left eye, moderate stage: Secondary | ICD-10-CM | POA: Diagnosis not present

## 2018-03-27 DIAGNOSIS — C4442 Squamous cell carcinoma of skin of scalp and neck: Secondary | ICD-10-CM | POA: Diagnosis not present

## 2018-03-27 DIAGNOSIS — L570 Actinic keratosis: Secondary | ICD-10-CM | POA: Diagnosis not present

## 2018-04-07 DIAGNOSIS — L089 Local infection of the skin and subcutaneous tissue, unspecified: Secondary | ICD-10-CM | POA: Diagnosis not present

## 2018-04-07 DIAGNOSIS — L570 Actinic keratosis: Secondary | ICD-10-CM | POA: Diagnosis not present

## 2018-04-24 DIAGNOSIS — L570 Actinic keratosis: Secondary | ICD-10-CM | POA: Diagnosis not present

## 2018-05-04 MED ORDER — DILTIAZEM HCL ER COATED BEADS 120 MG PO CP24: 120.0000 mg | ORAL_CAPSULE | Freq: Every day | ORAL | 0 refills | Status: DC

## 2018-05-19 ENCOUNTER — Encounter: Payer: Self-pay | Admitting: *Deleted

## 2018-05-25 ENCOUNTER — Telehealth: Payer: Self-pay | Admitting: *Deleted

## 2018-05-25 DIAGNOSIS — H401113 Primary open-angle glaucoma, right eye, severe stage: Secondary | ICD-10-CM | POA: Diagnosis not present

## 2018-05-25 DIAGNOSIS — H401122 Primary open-angle glaucoma, left eye, moderate stage: Secondary | ICD-10-CM | POA: Diagnosis not present

## 2018-05-25 NOTE — Telephone Encounter (Signed)
Called patient to let them know due to recent [employer] and Health Department Protocols, we are not seeing patients in the office. We are instead seeing if they would like to schedule this appointment as a Research scientist (medical) or Laptop. Patient is aware if they decide to reschedule this appointment, they may not be seen or scheduled for the next 4-6 months. Patient is agreeable to WebEx. WebEx Virtual Visit Information sent [place].

## 2018-06-04 ENCOUNTER — Telehealth (INDEPENDENT_AMBULATORY_CARE_PROVIDER_SITE_OTHER): Payer: Medicare HMO | Admitting: Cardiology

## 2018-06-04 ENCOUNTER — Other Ambulatory Visit: Payer: Self-pay

## 2018-06-04 ENCOUNTER — Encounter: Payer: Self-pay | Admitting: Cardiology

## 2018-06-04 DIAGNOSIS — I480 Paroxysmal atrial fibrillation: Secondary | ICD-10-CM | POA: Diagnosis not present

## 2018-06-04 DIAGNOSIS — Z9989 Dependence on other enabling machines and devices: Secondary | ICD-10-CM | POA: Diagnosis not present

## 2018-06-04 DIAGNOSIS — G4733 Obstructive sleep apnea (adult) (pediatric): Secondary | ICD-10-CM | POA: Diagnosis not present

## 2018-06-04 NOTE — Progress Notes (Signed)
Electrophysiology TeleHealth Note   Due to national recommendations of social distancing due to COVID 19, an audio/video telehealth visit is felt to be most appropriate for this patient at this time.  See MyChart message from today for the patient's consent to telehealth for [facility].   Date:  06/04/2018   ID:  Zachary Montoya, DOB 15-May-1945, MRN [id number]  Location: patient's home  Provider location: [address] [place]  Evaluation Performed: Follow-up visit  PCP:  Crist Infante, MD  Cardiologist:  No primary care provider on file.  Electrophysiologist:  Dr Curt Bears  Chief Complaint: Atrial fibrillation  History of Present Illness:    Zachary Montoya is a 73 y.o. male who presents via audio/video conferencing for a telehealth visit today.  Since last being seen in our clinic, the patient reports doing very well.  Today, he denies symptoms of palpitations, chest pain, shortness of breath,  lower extremity edema, dizziness, presyncope, or syncope.  The patient is otherwise without complaint today.  The patient denies symptoms of fevers, chills, cough, or new SOB worrisome for COVID 19.  Has a history of OSA, PAF, and DM. He currently wears CPAP which helps with his AF symptoms.  Today, denies symptoms of palpitations, chest pain, shortness of breath, orthopnea, PND, lower extremity edema, claudication, dizziness, presyncope, syncope, bleeding, or neurologic sequela. The patient is tolerating medications without difficulties.  Currently he feels well.  He continues to have episodes of atrial fibrillation every 4 to 6 weeks.  His episodes last from between 12 to 18 hours.  He is comfortable with his current regimen.  He is not limited in his daily activities.  He continues to exercise.  Past Medical History:  Diagnosis Date  . PAF (paroxysmal atrial fibrillation) ([place])   . Sleep apnea     Past Surgical History:  Procedure Laterality Date  . NO PAST  SURGERIES      Current Outpatient Medications  Medication Sig Dispense Refill  . atorvastatin (LIPITOR) 20 MG tablet Take 20 mg by mouth daily.  3  . Calcium Carbonate-Vitamin D (CALCIUM-D PO) Take 1 tablet by mouth daily. 600 mg calcium + 800 iu vit. d    . cetirizine (ZYRTEC) 10 MG tablet Take 10 mg by mouth daily as needed for allergies or rhinitis.     . Cholecalciferol (VITAMIN D3) 2000 UNITS TABS Take 2,000 Units by mouth daily.    . Coenzyme Q10 (COQ10 PO) Take 300 mg by mouth daily.    Marland Kitchen diltiazem (CARDIZEM) 30 MG tablet Take 1 tablet (30 mg total) by mouth every 6 (six) hours as needed. For AFib 90 tablet 0  . diltiazem (CARTIA XT) 120 MG 24 hr capsule Take 1 capsule (120 mg total) by mouth daily. 90 capsule 0  . ferrous sulfate 325 (65 FE) MG tablet Take 325 mg by mouth daily.    Marland Kitchen ibuprofen (ADVIL,MOTRIN) 200 MG tablet Take 400 mg by mouth every 6 (six) hours as needed (for pain).    . Multiple Vitamins-Minerals (CENTRUM ADULTS PO) Take 1 tablet by mouth daily.    . Omega-3 Fatty Acids (OMEGA 3 PO) Take 1,280 mg by mouth daily.    Marland Kitchen omeprazole (PRILOSEC) 20 MG capsule Take 20 mg by mouth daily.  3  . triamcinolone (NASACORT) 55 MCG/ACT AERO nasal inhaler Place 1 spray into the nose daily as needed (allergy season).      No current facility-administered medications for this visit.  Allergies:   Timolol   Social History:  The patient  reports that he has never smoked. He has never used smokeless tobacco. He reports that he does not drink alcohol or use drugs.   Family History:  The patient's  family history includes Cancer in his brother; Diabetes in his mother; Hypertension in his mother; Stroke in his mother.   ROS:  Please see the history of present illness.   All other systems are personally reviewed and negative.    Exam:    Vital Signs:  BP 122/73   Pulse 61   Wt 192 lb (87.1 kg)   BMI 29.19 kg/m   Well appearing, alert and conversant, regular work of  breathing,  good skin color Eyes- anicteric, neuro- grossly intact, skin- no apparent rash or lesions or cyanosis, mouth- oral mucosa is pink   Labs/Other Tests and Data Reviewed:    Recent Labs: No results found for requested labs within last 8760 hours.   Wt Readings from Last 3 Encounters:  06/04/18 192 lb (87.1 kg)  12/16/17 203 lb (92.1 kg)  11/14/16 203 lb 12.8 oz (92.4 kg)     Other studies personally reviewed: Additional studies/ records that were reviewed today include: ECG 12/16/17  Review of the above records today demonstrates:  Prior radiographs: SR, rate 59 stuff   ASSESSMENT & PLAN:    1.  Paroxysmal atrial fibrillation: Currently not anticoagulated due to his low stroke risk.  He has opted in the past to hold off on ablation therapy.  I have had discussions with him previously about anticoagulation and he has refused.  Chads VASC of 1.  He continues to want to hold off on anticoagulation.  If he has more frequent or longer episodes of atrial fibrillation, he would be interested in a rhythm control strategy.  2.  OSA: Well-controlled with CPAP   COVID 19 screen The patient denies symptoms of COVID 19 at this time.  The importance of social distancing was discussed today.  Follow-up: 6 months  Current medicines are reviewed at length with the patient today.   The patient does not have concerns regarding his medicines.  The following changes were made today:  none  Labs/ tests ordered today include:  No orders of the defined types were placed in this encounter.    Patient Risk:  after full review of this patients clinical status, I feel that they are at moderate risk at this time.  Today, I have spent 15 minutes with the patient with telehealth technology discussing atrial fibrillation.    Signed, Glyn Zendejas Meredith Leeds, MD  06/04/2018 2:35 PM     [place] [address] [phone] (office) [phone]  (fax)

## 2018-06-08 DIAGNOSIS — H401122 Primary open-angle glaucoma, left eye, moderate stage: Secondary | ICD-10-CM | POA: Diagnosis not present

## 2018-06-08 DIAGNOSIS — H401113 Primary open-angle glaucoma, right eye, severe stage: Secondary | ICD-10-CM | POA: Diagnosis not present

## 2018-07-08 ENCOUNTER — Telehealth: Payer: Self-pay | Admitting: *Deleted

## 2018-07-08 NOTE — Telephone Encounter (Signed)

## 2018-07-09 ENCOUNTER — Other Ambulatory Visit: Payer: Self-pay

## 2018-07-09 ENCOUNTER — Telehealth (INDEPENDENT_AMBULATORY_CARE_PROVIDER_SITE_OTHER): Payer: Medicare HMO | Admitting: Cardiology

## 2018-07-09 DIAGNOSIS — I480 Paroxysmal atrial fibrillation: Secondary | ICD-10-CM

## 2018-07-09 MED ORDER — FLECAINIDE ACETATE 50 MG PO TABS: 50.0000 mg | ORAL_TABLET | Freq: Two times a day (BID) | ORAL | 6 refills | Status: DC

## 2018-07-09 NOTE — Addendum Note (Signed)
Addended by: Stanton Kidney on: 07/09/2018 12:38 PM   Modules accepted: Orders

## 2018-07-09 NOTE — Progress Notes (Signed)
Electrophysiology TeleHealth Note   Due to national recommendations of social distancing due to COVID 19, an audio/video telehealth visit is felt to be most appropriate for this patient at this time.  See Epic message for the patient's consent to telehealth for [facility].   Date:  07/09/2018   ID:  [name], DOB 17-Jun-1945, MRN [id number]  Location: patient's home  Provider location: [address] [place]  Evaluation Performed: Follow-up visit  PCP:  Crist Infante, MD  Cardiologist:  No primary care provider on file.  Electrophysiologist:  Dr Curt Bears  Chief Complaint: Atrial fibrillation  History of Present Illness:    Kevyn Boquet is a 73 y.o. male who presents via audio/video conferencing for a telehealth visit today.  Since last being seen in our clinic, the patient reports doing very well.  Today, he denies symptoms of palpitations, chest pain, shortness of breath,  lower extremity edema, dizziness, presyncope, or syncope.  The patient is otherwise without complaint today.  The patient denies symptoms of fevers, chills, cough, or new SOB worrisome for COVID 19.  He has a history of paroxysmal atrial fibrillation and obstructive sleep apnea on CPAP.  Today, denies symptoms of palpitations, chest pain, shortness of breath, orthopnea, PND, lower extremity edema, claudication, dizziness, presyncope, syncope, bleeding, or neurologic sequela. The patient is tolerating medications without difficulties.  He is currently feeling well.  He did have an episode of atrial fibrillation recently.  The episode lasted for 26 hours.  He says that his heart rate went into the 140s to 150s at times.  He did take a dose of PRN diltiazem which did not improve his symptoms.  At this point he would prefer a rhythm control strategy.  Past Medical History:  Diagnosis Date  . PAF (paroxysmal atrial fibrillation) ([place])   . Sleep apnea     Past Surgical History:   Procedure Laterality Date  . NO PAST SURGERIES      Current Outpatient Medications  Medication Sig Dispense Refill  . atorvastatin (LIPITOR) 20 MG tablet Take 20 mg by mouth daily.  3  . Calcium Carbonate-Vitamin D (CALCIUM-D PO) Take 1 tablet by mouth daily. 600 mg calcium + 800 iu vit. d    . cetirizine (ZYRTEC) 10 MG tablet Take 10 mg by mouth daily as needed for allergies or rhinitis.     . Cholecalciferol (VITAMIN D3) 2000 UNITS TABS Take 2,000 Units by mouth daily.    . Coenzyme Q10 (COQ10 PO) Take 300 mg by mouth daily.    Marland Kitchen diltiazem (CARDIZEM) 30 MG tablet Take 1 tablet (30 mg total) by mouth every 6 (six) hours as needed. For AFib 90 tablet 0  . diltiazem (CARTIA XT) 120 MG 24 hr capsule Take 1 capsule (120 mg total) by mouth daily. 90 capsule 0  . ferrous sulfate 325 (65 FE) MG tablet Take 325 mg by mouth daily.    Marland Kitchen ibuprofen (ADVIL,MOTRIN) 200 MG tablet Take 400 mg by mouth every 6 (six) hours as needed (for pain).    . Multiple Vitamins-Minerals (CENTRUM ADULTS PO) Take 1 tablet by mouth daily.    . Omega-3 Fatty Acids (OMEGA 3 PO) Take 1,280 mg by mouth daily.    Marland Kitchen omeprazole (PRILOSEC) 20 MG capsule Take 20 mg by mouth daily.  3  . triamcinolone (NASACORT) 55 MCG/ACT AERO nasal inhaler Place 1 spray into the nose daily as needed (allergy season).      No current  facility-administered medications for this visit.     Allergies:   Timolol   Social History:  The patient  reports that he has never smoked. He has never used smokeless tobacco. He reports that he does not drink alcohol or use drugs.   Family History:  The patient's  family history includes Cancer in his brother; Diabetes in his mother; Hypertension in his mother; Stroke in his mother.   ROS:  Please see the history of present illness.   All other systems are personally reviewed and negative.    Exam:    Vital Signs:  There were no vitals taken for this visit.  Well appearing, alert and conversant, regular  work of breathing,  good skin color Eyes- anicteric, neuro- grossly intact, skin- no apparent rash or lesions or cyanosis, mouth- oral mucosa is pink   Labs/Other Tests and Data Reviewed:    Recent Labs: No results found for requested labs within last 8760 hours.   Wt Readings from Last 3 Encounters:  06/04/18 192 lb (87.1 kg)  12/16/17 203 lb (92.1 kg)  11/14/16 203 lb 12.8 oz (92.4 kg)     Other studies personally reviewed: Additional studies/ records that were reviewed today include: ECG personally reviewed Review of the above records today demonstrates: Sinus rhythm  ASSESSMENT & PLAN:    1.  Paroxysmal atrial fibrillation: Currently on diltiazem.  He has not anticoagulated due to a low stroke risk.  That being said, we Azariya Freeman likely plan for ablation in the future.  We Harlan Ervine start him on Eliquis.  He Kwan Shellhammer need to start taking this 3 weeks prior to his ablation.  He is also continuing to have symptoms of atrial fibrillation.  Due to that, we Joeleen Wortley start him on flecainide 50 mg.  I talked to him about the risks and benefits of ablation.  Risks include bleeding, tamponade, heart block, stroke, damage surrounding organs.  He understands these risks and is agreed to the procedure.  He would be category 3B.  This patients CHA2DS2-VASc Score and unadjusted Ischemic Stroke Rate (% per year) is equal to 0.6 % stroke rate/year from a score of 1  Above score calculated as 1 point each if present [CHF, HTN, DM, Vascular=MI/PAD/Aortic Plaque, Age if 65-74, or Male] Above score calculated as 2 points each if present [Age > 75, or Stroke/TIA/TE]  2.  OSA: CPAP compliance encouraged   COVID 19 screen The patient denies symptoms of COVID 19 at this time.  The importance of social distancing was discussed today.  Follow-up: 3 months   Current medicines are reviewed at length with the patient today.   The patient does not have concerns regarding his medicines.  The following changes were made  today: Start flecainide 50 mg, Eliquis 3 weeks prior to ablation  Labs/ tests ordered today include:  No orders of the defined types were placed in this encounter.    Patient Risk:  after full review of this patients clinical status, I feel that they are at moderate risk at this time.  Today, I have spent 16 minutes with the patient with telehealth technology discussing atrial fibrillation.    Signed, Albin Duckett Meredith Leeds, MD  07/09/2018 10:50 AM     [facility] [address] [place] [address] [phone] (office) [phone] (fax)

## 2018-07-15 ENCOUNTER — Other Ambulatory Visit: Payer: Self-pay | Admitting: Cardiology

## 2018-07-15 DIAGNOSIS — L309 Dermatitis, unspecified: Secondary | ICD-10-CM | POA: Diagnosis not present

## 2018-09-07 DIAGNOSIS — H401122 Primary open-angle glaucoma, left eye, moderate stage: Secondary | ICD-10-CM | POA: Diagnosis not present

## 2018-09-07 DIAGNOSIS — H401113 Primary open-angle glaucoma, right eye, severe stage: Secondary | ICD-10-CM | POA: Diagnosis not present

## 2018-09-30 DIAGNOSIS — H5213 Myopia, bilateral: Secondary | ICD-10-CM | POA: Diagnosis not present

## 2018-09-30 DIAGNOSIS — H52223 Regular astigmatism, bilateral: Secondary | ICD-10-CM | POA: Diagnosis not present

## 2018-09-30 DIAGNOSIS — H401121 Primary open-angle glaucoma, left eye, mild stage: Secondary | ICD-10-CM | POA: Diagnosis not present

## 2018-09-30 DIAGNOSIS — H25813 Combined forms of age-related cataract, bilateral: Secondary | ICD-10-CM | POA: Diagnosis not present

## 2018-09-30 DIAGNOSIS — H524 Presbyopia: Secondary | ICD-10-CM | POA: Diagnosis not present

## 2018-09-30 DIAGNOSIS — H401112 Primary open-angle glaucoma, right eye, moderate stage: Secondary | ICD-10-CM | POA: Diagnosis not present

## 2018-10-14 DIAGNOSIS — E781 Pure hyperglyceridemia: Secondary | ICD-10-CM | POA: Diagnosis not present

## 2018-10-14 DIAGNOSIS — Z23 Encounter for immunization: Secondary | ICD-10-CM | POA: Diagnosis not present

## 2018-10-14 DIAGNOSIS — R7301 Impaired fasting glucose: Secondary | ICD-10-CM | POA: Diagnosis not present

## 2018-10-14 DIAGNOSIS — Z125 Encounter for screening for malignant neoplasm of prostate: Secondary | ICD-10-CM | POA: Diagnosis not present

## 2018-10-20 DIAGNOSIS — R82998 Other abnormal findings in urine: Secondary | ICD-10-CM | POA: Diagnosis not present

## 2018-10-21 DIAGNOSIS — E669 Obesity, unspecified: Secondary | ICD-10-CM | POA: Diagnosis not present

## 2018-10-21 DIAGNOSIS — D649 Anemia, unspecified: Secondary | ICD-10-CM | POA: Diagnosis not present

## 2018-10-21 DIAGNOSIS — R011 Cardiac murmur, unspecified: Secondary | ICD-10-CM | POA: Diagnosis not present

## 2018-10-21 DIAGNOSIS — H547 Unspecified visual loss: Secondary | ICD-10-CM | POA: Diagnosis not present

## 2018-10-21 DIAGNOSIS — Z0000 Encounter for general adult medical examination without abnormal findings: Secondary | ICD-10-CM | POA: Diagnosis not present

## 2018-10-21 DIAGNOSIS — G589 Mononeuropathy, unspecified: Secondary | ICD-10-CM | POA: Diagnosis not present

## 2018-10-21 DIAGNOSIS — R7301 Impaired fasting glucose: Secondary | ICD-10-CM | POA: Diagnosis not present

## 2018-10-21 DIAGNOSIS — E781 Pure hyperglyceridemia: Secondary | ICD-10-CM | POA: Diagnosis not present

## 2018-10-21 DIAGNOSIS — J309 Allergic rhinitis, unspecified: Secondary | ICD-10-CM | POA: Diagnosis not present

## 2018-10-21 DIAGNOSIS — I480 Paroxysmal atrial fibrillation: Secondary | ICD-10-CM | POA: Diagnosis not present

## 2018-10-27 ENCOUNTER — Other Ambulatory Visit: Payer: Self-pay | Admitting: Cardiology

## 2018-10-27 ENCOUNTER — Other Ambulatory Visit: Payer: Self-pay

## 2018-10-27 ENCOUNTER — Ambulatory Visit (INDEPENDENT_AMBULATORY_CARE_PROVIDER_SITE_OTHER): Payer: Medicare HMO | Admitting: Cardiology

## 2018-10-27 ENCOUNTER — Encounter: Payer: Self-pay | Admitting: Cardiology

## 2018-10-27 VITALS — BP 124/80 | HR 57 | Ht 68.0 in | Wt 200.4 lb

## 2018-10-27 DIAGNOSIS — I480 Paroxysmal atrial fibrillation: Secondary | ICD-10-CM

## 2018-10-27 NOTE — Progress Notes (Signed)
Electrophysiology Office Note   Date:  10/27/2018   ID:  Zachary Montoya, DOB Sep 20, 1945, MRN [id number]  PCP:  Crist Infante, MD  Cardiologist:  Irish Lack Primary Electrophysiologist:  Constance Haw, MD    No chief complaint on file.    History of Present Illness: Zachary Montoya is a 73 y.o. male who presents today for electrophysiology evaluation.   He has a history of OSA and PAF, DM.  Episodes of AF every other day.  Longest 10 hours.  Takes metoprolol for prolonged episodes.  Uses CPAP for OSA.  He was diagnosed with atrial fibrillation potentially back in the 1980s along with his diagnosis for sleep apnea. He says that he was being evaluated for ablation at that time, but when he was started on CPAP, his symptoms of atrial fibrillation were greatly reduced.   Today, denies symptoms of palpitations, chest pain, shortness of breath, orthopnea, PND, lower extremity edema, claudication, dizziness, presyncope, syncope, bleeding, or neurologic sequela. The patient is tolerating medications without difficulties.  Overall he is doing well.  He has had 1 episode of atrial fibrillation since starting flecainide.  The episode lasted approximately 1 hour.  Otherwise he has done well.   Past Medical History:  Diagnosis Date  . PAF (paroxysmal atrial fibrillation) ([place])   . Sleep apnea    Past Surgical History:  Procedure Laterality Date  . NO PAST SURGERIES       Current Outpatient Medications  Medication Sig Dispense Refill  . atorvastatin (LIPITOR) 20 MG tablet Take 20 mg by mouth daily.  3  . Calcium Carbonate-Vitamin D (CALCIUM-D PO) Take 1 tablet by mouth daily. 600 mg calcium + 800 iu vit. d    . CARTIA XT 120 MG 24 hr capsule TAKE ONE CAPSULE BY MOUTH ONE TIME DAILY  90 capsule 1  . cetirizine (ZYRTEC) 10 MG tablet Take 10 mg by mouth daily as needed for allergies or rhinitis.     . Cholecalciferol (VITAMIN D3) 2000 UNITS TABS Take 2,000 Units by mouth daily.    .  Coenzyme Q10 (COQ10 PO) Take 300 mg by mouth daily.    Marland Kitchen diltiazem (CARDIZEM) 30 MG tablet Take 1 tablet (30 mg total) by mouth every 6 (six) hours as needed. For AFib 90 tablet 0  . ferrous sulfate 325 (65 FE) MG tablet Take 325 mg by mouth daily.    . flecainide (TAMBOCOR) 50 MG tablet Take 1 tablet (50 mg total) by mouth 2 (two) times daily. 60 tablet 6  . ibuprofen (ADVIL,MOTRIN) 200 MG tablet Take 400 mg by mouth every 6 (six) hours as needed (for pain).    . Multiple Vitamins-Minerals (CENTRUM ADULTS PO) Take 1 tablet by mouth daily.    . Omega-3 Fatty Acids (OMEGA 3 PO) Take 1,280 mg by mouth daily.    Marland Kitchen omeprazole (PRILOSEC) 20 MG capsule Take 20 mg by mouth daily.  3  . triamcinolone (NASACORT) 55 MCG/ACT AERO nasal inhaler Place 1 spray into the nose daily as needed (allergy season).      No current facility-administered medications for this visit.     Allergies:   Timolol   Social History:  The patient  reports that he has never smoked. He has never used smokeless tobacco. He reports that he does not drink alcohol or use drugs.   Family History:  The patient's family history includes Cancer in his brother; Diabetes in his mother; Hypertension in his mother; Stroke in his mother.  ROS:  Please see the history of present illness.   Otherwise, review of systems is positive for none.   All other systems are reviewed and negative.   PHYSICAL EXAM: VS:  BP 124/80   Pulse (!) 57   Ht 5\' 8"  (1.727 m)   Wt 200 lb 6.4 oz (90.9 kg)   SpO2 97%   BMI 30.47 kg/m  , BMI Body mass index is 30.47 kg/m. GEN: Well nourished, well developed, in no acute distress  HEENT: normal  Neck: no JVD, carotid bruits, or masses Cardiac: RRR; no murmurs, rubs, or gallops,no edema  Respiratory:  clear to auscultation bilaterally, normal work of breathing GI: soft, nontender, nondistended, + BS MS: no deformity or atrophy  Skin: warm and dry Neuro:  Strength and sensation are intact Psych:  euthymic mood, full affect  EKG:  EKG is ordered today. Personal review of the ekg ordered shows SR, rate 57   Recent Labs: No results found for requested labs within last 8760 hours.    Lipid Panel  No results found for: CHOL, TRIG, HDL, CHOLHDL, VLDL, LDLCALC, LDLDIRECT   Wt Readings from Last 3 Encounters:  10/27/18 200 lb 6.4 oz (90.9 kg)  06/04/18 192 lb (87.1 kg)  12/16/17 203 lb (92.1 kg)      Other studies Reviewed: Additional studies/ records that were reviewed today include: TTE 01/03/15 - Left ventricle: The cavity size was normal. There was mild  concentric hypertrophy. Systolic function was normal. The  estimated ejection fraction was in the range of 60% to 65%. Wall  motion was normal; there were no regional wall motion  abnormalities. Left ventricular diastolic function parameters  were normal. - Aortic valve: There was trivial regurgitation. - Right ventricle: The cavity size was normal. Wall thickness was  normal. Systolic function was normal. - Tricuspid valve: There was trivial regurgitation. - Pulmonic valve: There was trivial regurgitation. - Inferior vena cava: The vessel was dilated. The respirophasic  diameter changes were blunted (< 50%), consistent with elevated  central venous pressure. - Global longitudinal strain -16.3%  Telemetry 06/28/15   NSR with Paroxysmal Atrial fibrillation.  Atrial fibrillation 3% of the time.   ASSESSMENT AND PLAN:  1.  Paroxysmal atrial fibrillation: Currently on diltiazem and flecainide.  Not anticoagulated due to a low stroke risk.  At this point, he is doing well on flecainide.  He does not wish ablation.  We Amzie Sillas continue rhythm control managed medically.  I did talk to him about his stroke risk and having an increase as he gets older.  At this point, he would prefer to wait until he turns 75 to be anticoagulated.  This patients CHA2DS2-VASc Score and unadjusted Ischemic Stroke Rate (% per year) is equal  to 0.6 % stroke rate/year from a score of 1  Above score calculated as 1 point each if present [CHF, HTN, DM, Vascular=MI/PAD/Aortic Plaque, Age if 65-74, or Male] Above score calculated as 2 points each if present [Age > 75, or Stroke/TIA/TE]   2. OSA: Well-controlled with CPAP  Current medicines are reviewed at length with the patient today.   The patient does not have concerns regarding his medicines.  The following changes were made today: None  Labs/ tests ordered today include:  Orders Placed This Encounter  Procedures  . EKG 12-Lead     Disposition:   FU with Keymari Sato 6 months  Signed, Khamil Lamica Meredith Leeds, MD  10/27/2018 4:03 PM     [place] [id number]  [address] [place] [address] [phone] (office) [phone] (fax)

## 2018-12-10 DIAGNOSIS — L570 Actinic keratosis: Secondary | ICD-10-CM | POA: Diagnosis not present

## 2018-12-25 MED ORDER — FLECAINIDE ACETATE 50 MG PO TABS: 50.0000 mg | ORAL_TABLET | Freq: Two times a day (BID) | ORAL | 3 refills | Status: DC

## 2019-01-11 DIAGNOSIS — G4733 Obstructive sleep apnea (adult) (pediatric): Secondary | ICD-10-CM | POA: Diagnosis not present

## 2019-01-11 DIAGNOSIS — Z9989 Dependence on other enabling machines and devices: Secondary | ICD-10-CM | POA: Diagnosis not present

## 2019-01-11 DIAGNOSIS — H401122 Primary open-angle glaucoma, left eye, moderate stage: Secondary | ICD-10-CM | POA: Diagnosis not present

## 2019-01-11 DIAGNOSIS — H401113 Primary open-angle glaucoma, right eye, severe stage: Secondary | ICD-10-CM | POA: Diagnosis not present

## 2019-02-10 DIAGNOSIS — Z809 Family history of malignant neoplasm, unspecified: Secondary | ICD-10-CM | POA: Diagnosis not present

## 2019-02-10 DIAGNOSIS — Z6830 Body mass index (BMI) 30.0-30.9, adult: Secondary | ICD-10-CM | POA: Diagnosis not present

## 2019-02-10 DIAGNOSIS — I4891 Unspecified atrial fibrillation: Secondary | ICD-10-CM | POA: Diagnosis not present

## 2019-02-10 DIAGNOSIS — E785 Hyperlipidemia, unspecified: Secondary | ICD-10-CM | POA: Diagnosis not present

## 2019-02-10 DIAGNOSIS — J309 Allergic rhinitis, unspecified: Secondary | ICD-10-CM | POA: Diagnosis not present

## 2019-02-10 DIAGNOSIS — E669 Obesity, unspecified: Secondary | ICD-10-CM | POA: Diagnosis not present

## 2019-02-10 DIAGNOSIS — G4730 Sleep apnea, unspecified: Secondary | ICD-10-CM | POA: Diagnosis not present

## 2019-02-10 DIAGNOSIS — I10 Essential (primary) hypertension: Secondary | ICD-10-CM | POA: Diagnosis not present

## 2019-02-10 DIAGNOSIS — K219 Gastro-esophageal reflux disease without esophagitis: Secondary | ICD-10-CM | POA: Diagnosis not present

## 2019-02-10 DIAGNOSIS — D6869 Other thrombophilia: Secondary | ICD-10-CM | POA: Diagnosis not present

## 2019-02-11 DIAGNOSIS — H6123 Impacted cerumen, bilateral: Secondary | ICD-10-CM | POA: Diagnosis not present

## 2019-02-16 DIAGNOSIS — H903 Sensorineural hearing loss, bilateral: Secondary | ICD-10-CM | POA: Diagnosis not present

## 2019-02-24 DIAGNOSIS — H401133 Primary open-angle glaucoma, bilateral, severe stage: Secondary | ICD-10-CM | POA: Diagnosis not present

## 2019-02-25 DIAGNOSIS — H401123 Primary open-angle glaucoma, left eye, severe stage: Secondary | ICD-10-CM | POA: Insufficient documentation

## 2019-03-26 DIAGNOSIS — H6123 Impacted cerumen, bilateral: Secondary | ICD-10-CM | POA: Diagnosis not present

## 2019-03-30 DIAGNOSIS — L570 Actinic keratosis: Secondary | ICD-10-CM | POA: Diagnosis not present

## 2019-04-07 DIAGNOSIS — H5213 Myopia, bilateral: Secondary | ICD-10-CM | POA: Diagnosis not present

## 2019-04-07 DIAGNOSIS — H524 Presbyopia: Secondary | ICD-10-CM | POA: Diagnosis not present

## 2019-04-07 DIAGNOSIS — H401131 Primary open-angle glaucoma, bilateral, mild stage: Secondary | ICD-10-CM | POA: Diagnosis not present

## 2019-04-07 DIAGNOSIS — H52223 Regular astigmatism, bilateral: Secondary | ICD-10-CM | POA: Diagnosis not present

## 2019-04-12 ENCOUNTER — Other Ambulatory Visit: Payer: Self-pay

## 2019-04-12 MED ORDER — DILTIAZEM HCL ER COATED BEADS 120 MG PO CP24: 120.0000 mg | ORAL_CAPSULE | Freq: Every day | ORAL | 0 refills | Status: DC

## 2019-04-12 NOTE — Progress Notes (Unsigned)
Pt requesting refill of Diltiazem (Cartia XT) 120mg . 1 capsule daily #90 with no refills sent to [facility] as requested by patient.  Pt advised he will need to keep appointment with Dr Curt Bears for additional refills.

## 2019-04-21 DIAGNOSIS — H401113 Primary open-angle glaucoma, right eye, severe stage: Secondary | ICD-10-CM | POA: Diagnosis not present

## 2019-04-21 DIAGNOSIS — H401123 Primary open-angle glaucoma, left eye, severe stage: Secondary | ICD-10-CM | POA: Diagnosis not present

## 2019-04-29 ENCOUNTER — Encounter: Payer: Self-pay | Admitting: Cardiology

## 2019-04-29 ENCOUNTER — Ambulatory Visit: Payer: Medicare HMO | Admitting: Cardiology

## 2019-04-29 ENCOUNTER — Other Ambulatory Visit: Payer: Self-pay

## 2019-04-29 VITALS — BP 110/60 | HR 63 | Ht 68.0 in | Wt 201.8 lb

## 2019-04-29 DIAGNOSIS — I480 Paroxysmal atrial fibrillation: Secondary | ICD-10-CM | POA: Diagnosis not present

## 2019-04-29 MED ORDER — DILTIAZEM HCL ER COATED BEADS 120 MG PO CP24: 120.0000 mg | ORAL_CAPSULE | Freq: Every day | ORAL | 0 refills | Status: DC

## 2019-04-29 MED ORDER — DILTIAZEM HCL 30 MG PO TABS: 30.0000 mg | ORAL_TABLET | Freq: Four times a day (QID) | ORAL | 0 refills | Status: DC | PRN

## 2019-04-29 MED ORDER — FLECAINIDE ACETATE 50 MG PO TABS: 50.0000 mg | ORAL_TABLET | Freq: Two times a day (BID) | ORAL | 3 refills | Status: DC

## 2019-04-29 NOTE — Progress Notes (Signed)
Electrophysiology Office Note   Date:  04/29/2019   ID:  [name], DOB 14-Mar-1945, MRN [id number]  PCP:  Crist Infante, MD  Cardiologist:  Irish Lack Primary Electrophysiologist:  Constance Haw, MD    No chief complaint on file.    History of Present Illness: Alphonso Mayoral is a 74 y.o. male who presents today for electrophysiology evaluation.   He has a history of OSA and PAF, DM.  Episodes of AF every other day.  Longest 10 hours.  Takes metoprolol for prolonged episodes.  Uses CPAP for OSA.  He was diagnosed with atrial fibrillation potentially back in the 1980s along with his diagnosis for sleep apnea. He says that he was being evaluated for ablation at that time, but when he was started on CPAP, his symptoms of atrial fibrillation were greatly reduced.   Today, denies symptoms of palpitations, chest pain, shortness of breath, orthopnea, PND, lower extremity edema, claudication, dizziness, presyncope, syncope, bleeding, or neurologic sequela. The patient is tolerating medications without difficulties.  Overall he is doing well.  He has no chest pain or shortness of breath.  He has had further episodes of atrial fibrillation, but those occurred all when he missed doses of his flecainide.  He has not had any further episodes of atrial fibrillation when he has not missed doses.   Past Medical History:  Diagnosis Date  . 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 Elevated blood pressure reading without diagnosis of hypertension 06/07/2014  . Hyperlipidemia 06/07/2014  . PAF (paroxysmal atrial fibrillation) ([place])   . Sleep apnea    Past Surgical History:  Procedure Laterality Date  . NO PAST SURGERIES       Current Outpatient Medications  Medication Sig Dispense Refill  . atorvastatin (LIPITOR) 20 MG tablet Take 20 mg by mouth daily.  3  . Calcium Carbonate-Vitamin D (CALCIUM-D PO) Take 1 tablet by mouth daily. 600 mg calcium + 800 iu vit. d    .  cetirizine (ZYRTEC) 10 MG tablet Take 10 mg by mouth daily as needed for allergies or rhinitis.     . Cholecalciferol (VITAMIN D3) 2000 UNITS TABS Take 2,000 Units by mouth daily.    . Coenzyme Q10 (COQ10 PO) Take 300 mg by mouth daily.    Marland Kitchen diltiazem (CARDIZEM) 30 MG tablet Take 1 tablet (30 mg total) by mouth every 6 (six) hours as needed. For AFib 90 tablet 0  . diltiazem (CARTIA XT) 120 MG 24 hr capsule Take 1 capsule (120 mg total) by mouth daily. Pt Paticia Moster need to keep appointment with provider for additional refills 90 capsule 0  . ferrous sulfate 325 (65 FE) MG tablet Take 325 mg by mouth daily.    . flecainide (TAMBOCOR) 50 MG tablet Take 1 tablet (50 mg total) by mouth 2 (two) times daily. 180 tablet 3  . ibuprofen (ADVIL,MOTRIN) 200 MG tablet Take 400 mg by mouth every 6 (six) hours as needed (for pain).    . Multiple Vitamins-Minerals (CENTRUM ADULTS PO) Take 1 tablet by mouth daily.    . Omega-3 Fatty Acids (OMEGA 3 PO) Take 1,280 mg by mouth daily.    Marland Kitchen omeprazole (PRILOSEC) 20 MG capsule Take 20 mg by mouth daily.  3  . triamcinolone (NASACORT) 55 MCG/ACT AERO nasal inhaler Place 1 spray into the nose daily as needed (allergy season).      No current facility-administered medications for this visit.    Allergies:   Timolol  Social History:  The patient  reports that he has never smoked. He has never used smokeless tobacco. He reports that he does not drink alcohol or use drugs.   Family History:  The patient's family history includes Cancer in his brother; Diabetes in his mother; Hypertension in his mother; Stroke in his mother.    ROS:  Please see the history of present illness.   Otherwise, review of systems is positive for none.   All other systems are reviewed and negative.   PHYSICAL EXAM: VS:  BP 110/60   Pulse 63   Ht 5\' 8"  (1.727 m)   Wt 201 lb 12.8 oz (91.5 kg)   SpO2 97%   BMI 30.68 kg/m  , BMI Body mass index is 30.68 kg/m. GEN: Well nourished, well  developed, in no acute distress  HEENT: normal  Neck: no JVD, carotid bruits, or masses Cardiac: RRR; no murmurs, rubs, or gallops,no edema  Respiratory:  clear to auscultation bilaterally, normal work of breathing GI: soft, nontender, nondistended, + BS MS: no deformity or atrophy  Skin: warm and dry Neuro:  Strength and sensation are intact Psych: euthymic mood, full affect  EKG:  EKG is ordered today. Personal review of the ekg ordered shows sinus rhythm, rate 63  Recent Labs: No results found for requested labs within last 8760 hours.    Lipid Panel  No results found for: CHOL, TRIG, HDL, CHOLHDL, VLDL, LDLCALC, LDLDIRECT   Wt Readings from Last 3 Encounters:  04/29/19 201 lb 12.8 oz (91.5 kg)  10/27/18 200 lb 6.4 oz (90.9 kg)  06/04/18 192 lb (87.1 kg)      Other studies Reviewed: Additional studies/ records that were reviewed today include: TTE 01/03/15 - Left ventricle: The cavity size was normal. There was mild  concentric hypertrophy. Systolic function was normal. The  estimated ejection fraction was in the range of 60% to 65%. Wall  motion was normal; there were no regional wall motion  abnormalities. Left ventricular diastolic function parameters  were normal. - Aortic valve: There was trivial regurgitation. - Right ventricle: The cavity size was normal. Wall thickness was  normal. Systolic function was normal. - Tricuspid valve: There was trivial regurgitation. - Pulmonic valve: There was trivial regurgitation. - Inferior vena cava: The vessel was dilated. The respirophasic  diameter changes were blunted (< 50%), consistent with elevated  central venous pressure. - Global longitudinal strain -16.3%  Telemetry 06/28/15   NSR with Paroxysmal Atrial fibrillation.  Atrial fibrillation 3% of the time.   ASSESSMENT AND PLAN:  1.  Paroxysmal atrial fibrillation: Currently on diltiazem and flecainide.  Not anticoagulated due to a low stroke risk.   CHA2DS2-VASc of 1.  He remains in sinus rhythm.  At this point he does not wish to have any further therapy for his atrial fibrillation.  I did tell him that ablation was still an option.  He has had minimal atrial fibrillation since starting flecainide.  His only episode was when he missed a dose.   2. OSA: CPAP compliance encouraged  Current medicines are reviewed at length with the patient today.   The patient does not have concerns regarding his medicines.  The following changes were made today: None  Labs/ tests ordered today include:  Orders Placed This Encounter  Procedures  . EKG 12-Lead     Disposition:   FU with Ameliana Brashear 6 months  Signed, Jleigh Striplin Meredith Leeds, MD  04/29/2019 3:52 PM     [place] [id number]  [address] [place] [address] [phone] (office) [phone] (fax)

## 2019-04-30 DIAGNOSIS — H938X1 Other specified disorders of right ear: Secondary | ICD-10-CM | POA: Diagnosis not present

## 2019-05-06 DIAGNOSIS — R69 Illness, unspecified: Secondary | ICD-10-CM | POA: Diagnosis not present

## 2019-05-17 ENCOUNTER — Other Ambulatory Visit: Payer: Self-pay

## 2019-05-17 MED ORDER — DILTIAZEM HCL ER COATED BEADS 120 MG PO CP24: 120.0000 mg | ORAL_CAPSULE | Freq: Every day | ORAL | 3 refills | Status: DC

## 2019-06-03 DIAGNOSIS — H401113 Primary open-angle glaucoma, right eye, severe stage: Secondary | ICD-10-CM | POA: Diagnosis not present

## 2019-06-14 DIAGNOSIS — H401113 Primary open-angle glaucoma, right eye, severe stage: Secondary | ICD-10-CM | POA: Diagnosis not present

## 2019-06-14 DIAGNOSIS — H401123 Primary open-angle glaucoma, left eye, severe stage: Secondary | ICD-10-CM | POA: Diagnosis not present

## 2019-07-12 DIAGNOSIS — H401113 Primary open-angle glaucoma, right eye, severe stage: Secondary | ICD-10-CM | POA: Diagnosis not present

## 2019-07-12 DIAGNOSIS — H401123 Primary open-angle glaucoma, left eye, severe stage: Secondary | ICD-10-CM | POA: Diagnosis not present

## 2019-07-20 DIAGNOSIS — R69 Illness, unspecified: Secondary | ICD-10-CM | POA: Diagnosis not present

## 2019-07-22 DIAGNOSIS — K219 Gastro-esophageal reflux disease without esophagitis: Secondary | ICD-10-CM | POA: Diagnosis not present

## 2019-07-22 DIAGNOSIS — D6869 Other thrombophilia: Secondary | ICD-10-CM | POA: Diagnosis not present

## 2019-07-22 DIAGNOSIS — Z809 Family history of malignant neoplasm, unspecified: Secondary | ICD-10-CM | POA: Diagnosis not present

## 2019-07-22 DIAGNOSIS — G4730 Sleep apnea, unspecified: Secondary | ICD-10-CM | POA: Diagnosis not present

## 2019-07-22 DIAGNOSIS — R030 Elevated blood-pressure reading, without diagnosis of hypertension: Secondary | ICD-10-CM | POA: Diagnosis not present

## 2019-07-22 DIAGNOSIS — E785 Hyperlipidemia, unspecified: Secondary | ICD-10-CM | POA: Diagnosis not present

## 2019-07-22 DIAGNOSIS — I4891 Unspecified atrial fibrillation: Secondary | ICD-10-CM | POA: Diagnosis not present

## 2019-07-27 ENCOUNTER — Ambulatory Visit: Payer: Medicare HMO | Admitting: Physician Assistant

## 2019-08-10 ENCOUNTER — Other Ambulatory Visit: Payer: Self-pay

## 2019-08-10 ENCOUNTER — Ambulatory Visit: Payer: Medicare HMO | Admitting: Physician Assistant

## 2019-08-10 ENCOUNTER — Encounter: Payer: Self-pay | Admitting: Physician Assistant

## 2019-08-10 DIAGNOSIS — D044 Carcinoma in situ of skin of scalp and neck: Secondary | ICD-10-CM | POA: Diagnosis not present

## 2019-08-10 DIAGNOSIS — D0471 Carcinoma in situ of skin of right lower limb, including hip: Secondary | ICD-10-CM

## 2019-08-10 DIAGNOSIS — Z86018 Personal history of other benign neoplasm: Secondary | ICD-10-CM | POA: Diagnosis not present

## 2019-08-10 DIAGNOSIS — Z85828 Personal history of other malignant neoplasm of skin: Secondary | ICD-10-CM | POA: Diagnosis not present

## 2019-08-10 DIAGNOSIS — L821 Other seborrheic keratosis: Secondary | ICD-10-CM | POA: Diagnosis not present

## 2019-08-10 DIAGNOSIS — H938X3 Other specified disorders of ear, bilateral: Secondary | ICD-10-CM | POA: Diagnosis not present

## 2019-08-10 DIAGNOSIS — L570 Actinic keratosis: Secondary | ICD-10-CM

## 2019-08-10 NOTE — Patient Instructions (Signed)

## 2019-08-10 NOTE — Progress Notes (Addendum)
Follow-Up Visit   Subjective  Zachary Montoya is a 74 y.o. male who presents for the following: Annual Exam (no new concerns).   The following portions of the chart were reviewed this encounter and updated as appropriate: Tobacco  Allergies  Meds  Problems  Med Hx  Surg Hx  Fam Hx      Objective  Well appearing patient in no apparent distress; mood and affect are within normal limits.  A full examination was performed including scalp, head, eyes, ears, nose, lips, neck, chest, axillae, abdomen, back, buttocks, bilateral upper extremities, bilateral lower extremities, hands, feet, fingers, toes, fingernails, and toenails. All findings within normal limits unless otherwise noted below.  Objective  Mid Back: Stuck-on, waxy papules and plaques.   Objective  Left Forearm - Posterior (8), Mid Frontal Scalp (12), Right Hand - Anterior (2): Erythematous patches with gritty scale.  Objective  Left Upper Back, Right Thigh - Anterior: Clear scar  Objective  Right Thigh - Anterior: Hyperkeratotic scale with pink base      Mid Parietal Scalp     Assessment & Plan  Seborrheic keratosis Mid Back  observe  AK (actinic keratosis) (22) Right Hand - Anterior (2); Left Forearm - Posterior (8); Mid Frontal Scalp (12)  Destruction of lesion - Left Forearm - Posterior, Mid Frontal Scalp, Right Hand - Anterior Complexity: simple   Destruction method: cryotherapy   Informed consent: discussed and consent obtained   Timeout:  patient name, date of birth, surgical site, and procedure verified Lesion destroyed using liquid nitrogen: Yes   Cryotherapy cycles:  1 Outcome: patient tolerated procedure well with no complications   Post-procedure details: wound care instructions given    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  History of dysplastic nevus (2) Right Thigh - Anterior; Left Upper Back  observe  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 Right Thigh - Anterior  Epidermal / dermal shaving  Lesion diameter (cm):  1 Informed consent: discussed and consent obtained   Timeout: patient name, date of birth, surgical site, and procedure verified   Procedure prep:  Patient was prepped and draped in usual sterile fashion Prep type:  Chlorhexidine Anesthesia: the lesion was anesthetized in a standard fashion   Anesthetic:  1% lidocaine w/ epinephrine 1-100,000 local infiltration Instrument used: DermaBlade   Hemostasis achieved with: aluminum chloride   Outcome: patient tolerated procedure well   Post-procedure details: sterile dressing applied and wound care instructions given   Dressing type: petrolatum gauze, petrolatum and bandage    Destruction of lesion Complexity: simple   Destruction method: electrodesiccation and curettage   Informed consent: discussed and consent obtained   Timeout:  patient name, date of birth, surgical site, and procedure verified Anesthesia: the lesion was anesthetized in a standard fashion   Anesthetic:  1% lidocaine w/ epinephrine 1-100,000 local infiltration Curettage performed in three different directions: Yes   Curettage cycles:  3 Lesion length (cm):  0.9 Lesion width (cm):  0.8 Margin per side (cm):  0.1 Final wound size (cm):  1.1 Hemostasis achieved with:  aluminum chloride Outcome: patient tolerated procedure well with no complications   Post-procedure details: wound care instructions given    Specimen 2 - Surgical pathology Differential Diagnosis: *scc Check Margins: No  Carcinoma in situ of skin of scalp Mid Parietal Scalp  Epidermal / dermal shaving  Lesion diameter (cm):  2 Informed consent: discussed and consent obtained   Timeout:  patient name, date of birth, surgical site, and procedure verified   Procedure prep:  Patient was prepped and draped in usual sterile fashion Prep type:  Chlorhexidine Anesthesia: the lesion was anesthetized in a  standard fashion   Anesthetic:  1% lidocaine w/ epinephrine 1-100,000 local infiltration Instrument used: DermaBlade   Hemostasis achieved with: aluminum chloride   Outcome: patient tolerated procedure well   Post-procedure details: sterile dressing applied and wound care instructions given   Dressing type: petrolatum gauze, petrolatum and bandage    Destruction of lesion Complexity: simple   Destruction method: electrodesiccation and curettage   Informed consent: discussed and consent obtained   Timeout:  patient name, date of birth, surgical site, and procedure verified Anesthesia: the lesion was anesthetized in a standard fashion   Anesthetic:  1% lidocaine w/ epinephrine 1-100,000 local infiltration Curettage performed in three different directions: Yes   Electrodesiccation performed over the curetted area: Yes   Curettage cycles:  3 Lesion length (cm):  1.8 Lesion width (cm):  1.8 Margin per side (cm):  0.1 Final wound size (cm):  2 Hemostasis achieved with:  aluminum chloride Outcome: patient tolerated procedure well with no complications   Post-procedure details: wound care instructions given    Specimen 3 - Surgical pathology Differential Diagnosis:  Check Margins: No

## 2019-08-11 NOTE — Addendum Note (Signed)
Addended by: Robyne Askew R on: 08/11/2019 10:42 AM   Modules accepted: Orders, Level of Service

## 2019-08-16 DIAGNOSIS — R69 Illness, unspecified: Secondary | ICD-10-CM | POA: Diagnosis not present

## 2019-09-22 DIAGNOSIS — H401113 Primary open-angle glaucoma, right eye, severe stage: Secondary | ICD-10-CM | POA: Diagnosis not present

## 2019-09-22 DIAGNOSIS — H401123 Primary open-angle glaucoma, left eye, severe stage: Secondary | ICD-10-CM | POA: Diagnosis not present

## 2019-10-24 ENCOUNTER — Other Ambulatory Visit: Payer: Self-pay | Admitting: Cardiology

## 2019-10-26 DIAGNOSIS — H524 Presbyopia: Secondary | ICD-10-CM | POA: Diagnosis not present

## 2019-11-04 DIAGNOSIS — R69 Illness, unspecified: Secondary | ICD-10-CM | POA: Diagnosis not present

## 2019-11-11 ENCOUNTER — Other Ambulatory Visit: Payer: Self-pay

## 2019-11-11 ENCOUNTER — Encounter: Payer: Self-pay | Admitting: Physician Assistant

## 2019-11-11 ENCOUNTER — Ambulatory Visit: Payer: Medicare HMO | Admitting: Physician Assistant

## 2019-11-11 DIAGNOSIS — Z85828 Personal history of other malignant neoplasm of skin: Secondary | ICD-10-CM

## 2019-11-11 DIAGNOSIS — C44629 Squamous cell carcinoma of skin of left upper limb, including shoulder: Secondary | ICD-10-CM

## 2019-11-11 DIAGNOSIS — Z1283 Encounter for screening for malignant neoplasm of skin: Secondary | ICD-10-CM | POA: Diagnosis not present

## 2019-11-11 DIAGNOSIS — B078 Other viral warts: Secondary | ICD-10-CM | POA: Diagnosis not present

## 2019-11-11 NOTE — Patient Instructions (Signed)

## 2019-11-17 NOTE — Progress Notes (Signed)
   Follow-Up Visit   Subjective  Zachary Montoya is a 74 y.o. male who presents for the following: Follow-up (3 month follow up) and Warts (on top left hand--2 on left middle--1 on index finger).   The following portions of the chart were reviewed this encounter and updated as appropriate:     Objective  Well appearing patient in no apparent distress; mood and affect are within normal limits.  All skin waist up examined.  Objective  Left 3rd Finger Tip, Left Palmar Middle 2nd Finger: Verrucous papules -- Discussed viral etiology and contagion.   Objective  Mid Parietal Scalp, Right Thigh - Anterior: Scars clear  Objective  Left Hand - Posterior: Volcano growth on pink base       Objective  waist up and legs: No atypical nevi    Assessment & Plan  Other viral warts (2) Left Palmar Middle 2nd Finger; Left 3rd Finger Tip  DCA and home DCA provided  History of SCC (squamous cell carcinoma) of skin (2) Right Thigh - Anterior; Mid Parietal Scalp  observe  SCC (squamous cell carcinoma), hand, left Left Hand - Posterior  Skin / nail biopsy Type of biopsy: tangential   Informed consent: discussed and consent obtained   Timeout: patient name, date of birth, surgical site, and procedure verified   Anesthesia: the lesion was anesthetized in a standard fashion   Anesthetic:  1% lidocaine w/ epinephrine 1-100,000 local infiltration Instrument used: flexible razor blade   Hemostasis achieved with: aluminum chloride and electrodesiccation   Outcome: patient tolerated procedure well   Post-procedure details: wound care instructions given    Destruction of lesion Complexity: simple   Destruction method: electrodesiccation and curettage   Informed consent: discussed and consent obtained   Timeout:  patient name, date of birth, surgical site, and procedure verified Anesthesia: the lesion was anesthetized in a standard fashion   Anesthetic:  1% lidocaine w/ epinephrine  1-100,000 local infiltration Curettage performed in three different directions: Yes   Electrodesiccation performed over the curetted area: Yes   Curettage cycles:  3 Margin per side (cm):  0.1 Final wound size (cm):  1.4 Hemostasis achieved with:  aluminum chloride Outcome: patient tolerated procedure well with no complications   Post-procedure details: wound care instructions given    Specimen 1 - Surgical pathology Differential Diagnosis: KA Check Margins: yes  Screening exam for skin cancer waist up and legs  Biannual skin exams    I, Xabi Wittler, PA-C, have reviewed all documentation's for this visit.  The documentation on 11/17/19 for the exam, diagnosis, procedures and orders are all accurate and complete.

## 2019-11-22 DIAGNOSIS — H401133 Primary open-angle glaucoma, bilateral, severe stage: Secondary | ICD-10-CM | POA: Diagnosis not present

## 2019-11-22 DIAGNOSIS — E781 Pure hyperglyceridemia: Secondary | ICD-10-CM | POA: Diagnosis not present

## 2019-11-22 DIAGNOSIS — R7301 Impaired fasting glucose: Secondary | ICD-10-CM | POA: Diagnosis not present

## 2019-11-22 DIAGNOSIS — Z125 Encounter for screening for malignant neoplasm of prostate: Secondary | ICD-10-CM | POA: Diagnosis not present

## 2019-11-22 DIAGNOSIS — R300 Dysuria: Secondary | ICD-10-CM | POA: Diagnosis not present

## 2019-11-29 DIAGNOSIS — R001 Bradycardia, unspecified: Secondary | ICD-10-CM | POA: Diagnosis not present

## 2019-11-29 DIAGNOSIS — I480 Paroxysmal atrial fibrillation: Secondary | ICD-10-CM | POA: Diagnosis not present

## 2019-11-29 DIAGNOSIS — R7301 Impaired fasting glucose: Secondary | ICD-10-CM | POA: Diagnosis not present

## 2019-11-29 DIAGNOSIS — R82998 Other abnormal findings in urine: Secondary | ICD-10-CM | POA: Diagnosis not present

## 2019-11-29 DIAGNOSIS — E781 Pure hyperglyceridemia: Secondary | ICD-10-CM | POA: Diagnosis not present

## 2019-11-29 DIAGNOSIS — G4733 Obstructive sleep apnea (adult) (pediatric): Secondary | ICD-10-CM | POA: Diagnosis not present

## 2019-11-29 DIAGNOSIS — R011 Cardiac murmur, unspecified: Secondary | ICD-10-CM | POA: Diagnosis not present

## 2019-11-29 DIAGNOSIS — Z0000 Encounter for general adult medical examination without abnormal findings: Secondary | ICD-10-CM | POA: Diagnosis not present

## 2019-11-29 DIAGNOSIS — D649 Anemia, unspecified: Secondary | ICD-10-CM | POA: Diagnosis not present

## 2019-11-29 DIAGNOSIS — L570 Actinic keratosis: Secondary | ICD-10-CM | POA: Diagnosis not present

## 2019-11-29 DIAGNOSIS — Z23 Encounter for immunization: Secondary | ICD-10-CM | POA: Diagnosis not present

## 2019-12-08 DIAGNOSIS — Z1212 Encounter for screening for malignant neoplasm of rectum: Secondary | ICD-10-CM | POA: Diagnosis not present

## 2019-12-08 DIAGNOSIS — R69 Illness, unspecified: Secondary | ICD-10-CM | POA: Diagnosis not present

## 2019-12-23 DIAGNOSIS — H401123 Primary open-angle glaucoma, left eye, severe stage: Secondary | ICD-10-CM | POA: Diagnosis not present

## 2019-12-23 DIAGNOSIS — H401113 Primary open-angle glaucoma, right eye, severe stage: Secondary | ICD-10-CM | POA: Diagnosis not present

## 2019-12-30 ENCOUNTER — Encounter: Payer: Self-pay | Admitting: Cardiology

## 2019-12-30 ENCOUNTER — Other Ambulatory Visit: Payer: Self-pay

## 2019-12-30 ENCOUNTER — Ambulatory Visit: Payer: Medicare HMO | Admitting: Cardiology

## 2019-12-30 VITALS — BP 128/70 | HR 64 | Ht 68.0 in | Wt 210.2 lb

## 2019-12-30 DIAGNOSIS — I480 Paroxysmal atrial fibrillation: Secondary | ICD-10-CM | POA: Diagnosis not present

## 2019-12-30 NOTE — Progress Notes (Signed)
Electrophysiology Office Note   Date:  12/30/2019   ID:  Zachary Montoya, DOB [birth year], MRN [id number]  PCP:  Crist Infante, MD  Cardiologist:  Irish Lack Primary Electrophysiologist:  Constance Haw, MD    No chief complaint on file.    History of Present Illness: Zachary Montoya is a 74 y.o. male who presents today for electrophysiology evaluation.   He has a history significant for OSA, paroxysmal atrial fibrillation, and diabetes.  He previously had multiple AF episodes, and feels that he has had atrial fibrillation since the 1980s.  He does use his CPAP.  He is currently on flecainide, and since starting that medication, his atrial fibrillation symptoms have greatly reduced.  Today, denies symptoms of palpitations, chest pain, shortness of breath, orthopnea, PND, lower extremity edema, claudication, dizziness, presyncope, syncope, bleeding, or neurologic sequela. The patient is tolerating medications without difficulties.  Since last being seen he has done well.  He has no chest pain or shortness of breath and is able to do all of his daily activities.  He is tolerating the flecainide without issue.  He does note that when he misses doses, he does go into atrial fibrillation.  Despite that, he is overall comfortable with his control.   Past Medical History:  Diagnosis Date  . 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 Elevated blood pressure reading without diagnosis of hypertension 06/07/2014  . Hyperlipidemia 06/07/2014  . PAF (paroxysmal atrial fibrillation) ([place])   . Sleep apnea    Past Surgical History:  Procedure Laterality Date  . NO PAST SURGERIES       Current Outpatient Medications  Medication Sig Dispense Refill  . atorvastatin (LIPITOR) 20 MG tablet Take 20 mg by mouth daily.  3  . Calcium Carbonate-Vitamin D (CALCIUM-D PO) Take 1 tablet by mouth daily. 600 mg calcium + 800 iu vit. d    . cetirizine (ZYRTEC) 10 MG tablet Take 10 mg by  mouth daily as needed for allergies or rhinitis.     . Cholecalciferol (VITAMIN D3) 2000 UNITS TABS Take 2,000 Units by mouth daily.    . Coenzyme Q10 (COQ10 PO) Take 300 mg by mouth daily.    Marland Kitchen diltiazem (CARTIA XT) 120 MG 24 hr capsule Take 1 capsule (120 mg total) by mouth daily. 90 capsule 3  . ferrous sulfate 325 (65 FE) MG tablet Take 325 mg by mouth daily.    . flecainide (TAMBOCOR) 50 MG tablet Take 1 tablet by mouth twice daily. 180 tablet 1  . Multiple Vitamins-Minerals (CENTRUM ADULTS PO) Take 1 tablet by mouth daily.    . Omega-3 Fatty Acids (OMEGA 3 PO) Take 1,280 mg by mouth daily.    Marland Kitchen omeprazole (PRILOSEC) 20 MG capsule Take 20 mg by mouth daily.  3  . triamcinolone (NASACORT) 55 MCG/ACT AERO nasal inhaler Place 1 spray into the nose daily as needed (allergy season).     Marland Kitchen ibuprofen (ADVIL,MOTRIN) 200 MG tablet Take 400 mg by mouth every 6 (six) hours as needed (for pain). (Patient not taking: Reported on 12/30/2019)     No current facility-administered medications for this visit.    Allergies:   Timolol   Social History:  The patient  reports that he has never smoked. He has never used smokeless tobacco. He reports that he does not drink alcohol and does not use drugs.   Family History:  The patient's family history includes Cancer in his brother; Diabetes in his mother; Hypertension in  his mother; Stroke in his mother.   ROS:  Please see the history of present illness.   Otherwise, review of systems is positive for none.   All other systems are reviewed and negative.   PHYSICAL EXAM: VS:  BP 128/70   Pulse 64   Ht 5\' 8"  (1.727 m)   Wt 210 lb 3.2 oz (95.3 kg)   SpO2 98%   BMI 31.96 kg/m  , BMI Body mass index is 31.96 kg/m. GEN: Well nourished, well developed, in no acute distress  HEENT: normal  Neck: no JVD, carotid bruits, or masses Cardiac: RRR; no murmurs, rubs, or gallops,no edema  Respiratory:  clear to auscultation bilaterally, normal work of breathing GI:  soft, nontender, nondistended, + BS MS: no deformity or atrophy  Skin: warm and dry Neuro:  Strength and sensation are intact Psych: euthymic mood, full affect  EKG:  EKG is ordered today. Personal review of the ekg ordered shows sinus rhythm, rate 56  Recent Labs: No results found for requested labs within last 8760 hours.    Lipid Panel  No results found for: CHOL, TRIG, HDL, CHOLHDL, VLDL, LDLCALC, LDLDIRECT   Wt Readings from Last 3 Encounters:  12/30/19 210 lb 3.2 oz (95.3 kg)  04/29/19 201 lb 12.8 oz (91.5 kg)  10/27/18 200 lb 6.4 oz (90.9 kg)      Other studies Reviewed: Additional studies/ records that were reviewed today include: TTE 01/03/15 - Left ventricle: The cavity size was normal. There was mild  concentric hypertrophy. Systolic function was normal. The  estimated ejection fraction was in the range of 60% to 65%. Wall  motion was normal; there were no regional wall motion  abnormalities. Left ventricular diastolic function parameters  were normal. - Aortic valve: There was trivial regurgitation. - Right ventricle: The cavity size was normal. Wall thickness was  normal. Systolic function was normal. - Tricuspid valve: There was trivial regurgitation. - Pulmonic valve: There was trivial regurgitation. - Inferior vena cava: The vessel was dilated. The respirophasic  diameter changes were blunted (< 50%), consistent with elevated  central venous pressure. - Global longitudinal strain -16.3%  Telemetry 06/28/15   NSR with Paroxysmal Atrial fibrillation.  Atrial fibrillation 3% of the time.   ASSESSMENT AND PLAN:  1.  Paroxysmal atrial fibrillation: Currently on diltiazem and flecainide (monitoring for high risk medication).  CHA2DS2-VASc of 1 and thus not anticoagulated.  I discussed with him that he Sabastien Tyler likely need anticoagulation after his 75th birthday.  He remains in sinus rhythm and is comfortable with his current dosage of both flecainide and  diltiazem.  I have told him that if he wishes to get off of these medications or if his atrial fibrillation burden worsens, that ablation would be a reasonable option.  2.  Obstructive sleep apnea: CPAP compliance encouraged  Current medicines are reviewed at length with the patient today.   The patient does not have concerns regarding his medicines.  The following changes were made today: None  Labs/ tests ordered today include:  Orders Placed This Encounter  Procedures  . EKG 12-Lead     Disposition:   FU with Almina Schul 6 months  Signed, Clifford Benninger Meredith Leeds, MD  12/30/2019 9:59 AM     [facility] [address] [place] [address] [phone] (office) [phone] (fax)

## 2020-01-03 DIAGNOSIS — H938X3 Other specified disorders of ear, bilateral: Secondary | ICD-10-CM | POA: Diagnosis not present

## 2020-01-04 DIAGNOSIS — R69 Illness, unspecified: Secondary | ICD-10-CM | POA: Diagnosis not present

## 2020-01-11 DIAGNOSIS — G4733 Obstructive sleep apnea (adult) (pediatric): Secondary | ICD-10-CM | POA: Diagnosis not present

## 2020-01-11 DIAGNOSIS — Z9989 Dependence on other enabling machines and devices: Secondary | ICD-10-CM | POA: Diagnosis not present

## 2020-02-23 DIAGNOSIS — H903 Sensorineural hearing loss, bilateral: Secondary | ICD-10-CM | POA: Diagnosis not present

## 2020-03-03 ENCOUNTER — Telehealth: Payer: Self-pay | Admitting: *Deleted

## 2020-03-03 ENCOUNTER — Encounter: Payer: Self-pay | Admitting: *Deleted

## 2020-03-03 MED ORDER — APIXABAN 5 MG PO TABS: 5.0000 mg | ORAL_TABLET | Freq: Two times a day (BID) | ORAL | 0 refills | Status: DC

## 2020-03-03 NOTE — Telephone Encounter (Signed)
Per Trinidad Curet, RN. Pt is a new start on Eliquis 5 mg tablet BID. Leaving pt 4 boxes of Eliquis 5 mg tablets samples and a 30 day free card, for pt to pick up at the office.  Lot# 2 boxes [id number] Exp: 2/24 Lot# 2 Boxes [id number] Exp: 4/23

## 2020-03-03 NOTE — Telephone Encounter (Signed)
This encounter was created in error - please disregard.

## 2020-03-03 NOTE — Telephone Encounter (Signed)
Pt advised to start Eliquis for AFib. See my chart message for further documentation of this

## 2020-03-06 ENCOUNTER — Telehealth: Payer: Self-pay | Admitting: *Deleted

## 2020-03-06 MED ORDER — APIXABAN 5 MG PO TABS
5.0000 mg | ORAL_TABLET | Freq: Two times a day (BID) | ORAL | 1 refills | Status: DC
Start: 2020-03-06 — End: 2020-03-23

## 2020-03-07 ENCOUNTER — Telehealth: Payer: Self-pay | Admitting: Cardiology

## 2020-03-07 NOTE — Telephone Encounter (Signed)
Rx for Eliquis sent.

## 2020-03-07 NOTE — Telephone Encounter (Signed)
Left detailed message to Caryl Pina, RN (Perini's nurse). Informed why we were requesting result. Asked her to call office back if she needed to further discuss.

## 2020-03-07 NOTE — Telephone Encounter (Signed)
Caryl Pina from Dr. Silvestre Mesi office is calling stating she is returning Sherri's call of her wanting to know what the patient's last creatinine level was. Caryl Pina states it was 1.0 in September. Please advise.

## 2020-03-13 ENCOUNTER — Other Ambulatory Visit: Payer: Self-pay

## 2020-03-13 MED ORDER — DILTIAZEM HCL ER COATED BEADS 120 MG PO CP24: 120.0000 mg | ORAL_CAPSULE | Freq: Every day | ORAL | 2 refills | Status: DC

## 2020-03-20 NOTE — Telephone Encounter (Signed)
Pt has been taking Diltiazem since 2019 and Flecainide since 2020

## 2020-03-22 NOTE — Progress Notes (Signed)
Electrophysiology Office Note   Date:  03/23/2020   ID:  Zachary Montoya, [place] 08-29-1945, MRN [id number]  PCP:  Crist Infante, MD  Cardiologist:  Irish Lack Primary Electrophysiologist:  Constance Haw, MD    No chief complaint on file.    History of Present Illness: Zachary Montoya is a 75 y.o. male who presents today for electrophysiology evaluation.     He has a history significant for sleep apnea, paroxysmal atrial fibrillation, and diabetes.  He developed multiple episodes of atrial fibrillation and feels that this is been going on since the 1980s.  He does use his CPAP.    Today, denies symptoms of palpitations, chest pain, shortness of breath, orthopnea, PND, lower extremity edema, claudication, dizziness, presyncope, syncope, bleeding, or neurologic sequela. The patient is tolerating medications without difficulties.  He was started on Eliquis since last visit.  Unfortunately he developed a severe left-sided headache with visual changes.  This occurred 4 days ago.  He stopped the Eliquis and his symptoms started to improve but are not back to normal.  He has had minimal episodes of atrial fibrillation since last being seen.  He is comfortable with his overall control.   Past Medical History:  Diagnosis Date  . 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 Elevated blood pressure reading without diagnosis of hypertension 06/07/2014  . Hyperlipidemia 06/07/2014  . PAF (paroxysmal atrial fibrillation) ([place])   . Sleep apnea    Past Surgical History:  Procedure Laterality Date  . NO PAST SURGERIES       Current Outpatient Medications  Medication Sig Dispense Refill  . atorvastatin (LIPITOR) 20 MG tablet Take 20 mg by mouth daily.  3  . Calcium Carbonate-Vitamin D (CALCIUM-D PO) Take 1 tablet by mouth daily. 600 mg calcium + 800 iu vit. d    . cetirizine (ZYRTEC) 10 MG tablet Take 10 mg by mouth daily as needed for allergies or rhinitis.     .  Cholecalciferol (VITAMIN D3) 2000 UNITS TABS Take 2,000 Units by mouth daily.    . Coenzyme Q10 (COQ10 PO) Take 300 mg by mouth daily.    Marland Kitchen diltiazem (CARTIA XT) 120 MG 24 hr capsule Take 1 capsule (120 mg total) by mouth daily. 90 capsule 2  . ferrous sulfate 325 (65 FE) MG tablet Take 325 mg by mouth daily.    . flecainide (TAMBOCOR) 50 MG tablet Take 1 tablet by mouth twice daily. 180 tablet 1  . Multiple Vitamins-Minerals (CENTRUM ADULTS PO) Take 1 tablet by mouth daily.    . Omega-3 Fatty Acids (OMEGA 3 PO) Take 1,280 mg by mouth daily.    Marland Kitchen omeprazole (PRILOSEC) 20 MG capsule Take 20 mg by mouth daily.  3  . triamcinolone (NASACORT) 55 MCG/ACT AERO nasal inhaler Place 1 spray into the nose daily as needed (allergy season).      No current facility-administered medications for this visit.    Allergies:   Timolol   Social History:  The patient  reports that he has never smoked. He has never used smokeless tobacco. He reports that he does not drink alcohol and does not use drugs.   Family History:  The patient's family history includes Cancer in his brother; Diabetes in his mother; Hypertension in his mother; Stroke in his mother.   ROS:  Please see the history of present illness.   Otherwise, review of systems is positive for none.   All other systems are reviewed and negative.  PHYSICAL EXAM: VS:  BP 134/72   Pulse 70   Ht 5\' 8"  (1.727 m)   Wt 199 lb 6.4 oz (90.4 kg)   SpO2 96%   BMI 30.32 kg/m  , BMI Body mass index is 30.32 kg/m. GEN: Well nourished, well developed, in no acute distress  HEENT: normal  Neck: no JVD, carotid bruits, or masses Cardiac: RRR; no murmurs, rubs, or gallops,no edema  Respiratory:  clear to auscultation bilaterally, normal work of breathing GI: soft, nontender, nondistended, + BS MS: no deformity or atrophy  Skin: warm and dry Neuro:  Strength and sensation are intact Psych: euthymic mood, full affect  EKG:  EKG is ordered today. Personal  review of the ekg ordered shows sinus rhythm, rate 70  Recent Labs: No results found for requested labs within last 8760 hours.    Lipid Panel  No results found for: CHOL, TRIG, HDL, CHOLHDL, VLDL, LDLCALC, LDLDIRECT   Wt Readings from Last 3 Encounters:  03/23/20 199 lb 6.4 oz (90.4 kg)  12/30/19 210 lb 3.2 oz (95.3 kg)  04/29/19 201 lb 12.8 oz (91.5 kg)      Other studies Reviewed: Additional studies/ records that were reviewed today include: TTE 01/03/15 - Left ventricle: The cavity size was normal. There was mild  concentric hypertrophy. Systolic function was normal. The  estimated ejection fraction was in the range of 60% to 65%. Wall  motion was normal; there were no regional wall motion  abnormalities. Left ventricular diastolic function parameters  were normal. - Aortic valve: There was trivial regurgitation. - Right ventricle: The cavity size was normal. Wall thickness was  normal. Systolic function was normal. - Tricuspid valve: There was trivial regurgitation. - Pulmonic valve: There was trivial regurgitation. - Inferior vena cava: The vessel was dilated. The respirophasic  diameter changes were blunted (< 50%), consistent with elevated  central venous pressure. - Global longitudinal strain -16.3%  Telemetry 06/28/15   NSR with Paroxysmal Atrial fibrillation.  Atrial fibrillation 3% of the time.   ASSESSMENT AND PLAN:  1.  Paroxysmal atrial fibrillation: Currently on diltiazem and flecainide.  CHA2DS2-VASc of 1.  He was recently put on Eliquis but developed headaches and is now on Xarelto.  High risk medication monitoring.  He remains in sinus rhythm and has minimal episodes of atrial fibrillation.  We Janetta Vandoren continue his current dose of flecainide.  He has been having headaches which she attributes to the Eliquis.  We Jackie Littlejohn hold off on Eliquis.  If headaches do not improve within the next 48 hours, Arina Torry refer him to neurology.  I Zina Pitzer see him back in 3 months  for further discussion of anticoagulation.  2.  Obstructive sleep apnea: CPAP compliance encouraged  Current medicines are reviewed at length with the patient today.   The patient does not have concerns regarding his medicines.  The following changes were made today: Stop Eliquis  Labs/ tests ordered today include:  Orders Placed This Encounter  Procedures  . EKG 12-Lead     Disposition:   FU with Ovidio Steele 3 months  Signed, Brazos Sandoval Meredith Leeds, MD  03/23/2020 9:17 AM     [facility] [address] [place] [address] [phone] (office) [phone] (fax)

## 2020-03-23 ENCOUNTER — Other Ambulatory Visit: Payer: Self-pay

## 2020-03-23 ENCOUNTER — Ambulatory Visit: Payer: Medicare HMO | Admitting: Cardiology

## 2020-03-23 ENCOUNTER — Encounter: Payer: Self-pay | Admitting: Cardiology

## 2020-03-23 VITALS — BP 134/72 | HR 70 | Ht 68.0 in | Wt 199.4 lb

## 2020-03-23 DIAGNOSIS — I480 Paroxysmal atrial fibrillation: Secondary | ICD-10-CM | POA: Diagnosis not present

## 2020-03-23 NOTE — Patient Instructions (Signed)
Medication Instructions:  Your physician has recommended you make the following change in your medication:  1. HOLD Eliquis, if headaches do not improve within the next 2 days then let us know and we will refer you to neurology.  *If you need a refill on your cardiac medications before your next appointment, please call your pharmacy*   Lab Work: None ordered   Testing/Procedures: None ordered   Follow-Up: At [facility], you and your health needs are our priority.  As part of our continuing mission to provide you with exceptional heart care, we have created designated Provider Care Teams.  These Care Teams include your primary Cardiologist (physician) and Advanced Practice Providers (APPs -  Physician Assistants and Nurse Practitioners) who all work together to provide you with the care you need, when you need it.  Your next appointment:   3 month(s)  The format for your next appointment:   In Person  Provider:   Allegra Lai, MD    Thank you for choosing [facility]!!   Trinidad Curet, RN [phone]   Other Instructions

## 2020-03-27 DIAGNOSIS — H401123 Primary open-angle glaucoma, left eye, severe stage: Secondary | ICD-10-CM | POA: Diagnosis not present

## 2020-03-27 DIAGNOSIS — H401113 Primary open-angle glaucoma, right eye, severe stage: Secondary | ICD-10-CM | POA: Diagnosis not present

## 2020-03-28 ENCOUNTER — Encounter (HOSPITAL_COMMUNITY): Payer: Self-pay | Admitting: Pediatrics

## 2020-03-28 ENCOUNTER — Inpatient Hospital Stay (HOSPITAL_COMMUNITY): Payer: Medicare HMO

## 2020-03-28 ENCOUNTER — Emergency Department (HOSPITAL_COMMUNITY): Payer: Medicare HMO

## 2020-03-28 ENCOUNTER — Observation Stay (HOSPITAL_COMMUNITY)
Admission: EM | Admit: 2020-03-28 | Discharge: 2020-03-29 | Disposition: A | Discharge status: Home / Self Care | Payer: Medicare HMO | Attending: Internal Medicine | Admitting: Internal Medicine

## 2020-03-28 ENCOUNTER — Other Ambulatory Visit: Payer: Self-pay

## 2020-03-28 DIAGNOSIS — G4733 Obstructive sleep apnea (adult) (pediatric): Secondary | ICD-10-CM | POA: Diagnosis not present

## 2020-03-28 DIAGNOSIS — G319 Degenerative disease of nervous system, unspecified: Secondary | ICD-10-CM | POA: Diagnosis not present

## 2020-03-28 DIAGNOSIS — Z79899 Other long term (current) drug therapy: Secondary | ICD-10-CM | POA: Diagnosis not present

## 2020-03-28 DIAGNOSIS — R519 Headache, unspecified: Secondary | ICD-10-CM

## 2020-03-28 DIAGNOSIS — I480 Paroxysmal atrial fibrillation: Secondary | ICD-10-CM | POA: Diagnosis not present

## 2020-03-28 DIAGNOSIS — Z20822 Contact with and (suspected) exposure to covid-19: Secondary | ICD-10-CM | POA: Insufficient documentation

## 2020-03-28 DIAGNOSIS — I672 Cerebral atherosclerosis: Secondary | ICD-10-CM | POA: Diagnosis not present

## 2020-03-28 DIAGNOSIS — I10 Essential (primary) hypertension: Secondary | ICD-10-CM | POA: Insufficient documentation

## 2020-03-28 DIAGNOSIS — R55 Syncope and collapse: Principal | ICD-10-CM

## 2020-03-28 DIAGNOSIS — E119 Type 2 diabetes mellitus without complications: Secondary | ICD-10-CM | POA: Diagnosis not present

## 2020-03-28 DIAGNOSIS — Z7901 Long term (current) use of anticoagulants: Secondary | ICD-10-CM | POA: Insufficient documentation

## 2020-03-28 LAB — URINALYSIS, ROUTINE W REFLEX MICROSCOPIC
Bilirubin Urine: NEGATIVE
Glucose, UA: NEGATIVE mg/dL
Hgb urine dipstick: NEGATIVE
Ketones, ur: NEGATIVE mg/dL
Leukocytes,Ua: NEGATIVE
Nitrite: NEGATIVE
Protein, ur: NEGATIVE mg/dL
Specific Gravity, Urine: 1.015 (ref 1.005–1.030)
pH: 8.5 — ABNORMAL HIGH (ref 5.0–8.0)

## 2020-03-28 LAB — CBC
HCT: 47.1 % (ref 39.0–52.0)
Hemoglobin: 16.4 g/dL (ref 13.0–17.0)
MCH: 33 pg (ref 26.0–34.0)
MCHC: 34.8 g/dL (ref 30.0–36.0)
MCV: 94.8 fL (ref 80.0–100.0)
Platelets: 230 10*3/uL (ref 150–400)
RBC: 4.97 MIL/uL (ref 4.22–5.81)
RDW: 12 % (ref 11.5–15.5)
WBC: 10.4 10*3/uL (ref 4.0–10.5)
nRBC: 0 % (ref 0.0–0.2)

## 2020-03-28 LAB — TROPONIN I (HIGH SENSITIVITY)
Troponin I (High Sensitivity): 6 ng/L (ref <18)
Troponin I (High Sensitivity): 5 ng/L (ref <18)

## 2020-03-28 LAB — BASIC METABOLIC PANEL
Anion gap: 11 (ref 5–15)
BUN: 15 mg/dL (ref 8–23)
CO2: 24 mmol/L (ref 22–32)
Calcium: 9.8 mg/dL (ref 8.9–10.3)
Chloride: 104 mmol/L (ref 98–111)
Creatinine, Ser: 1.21 mg/dL (ref 0.61–1.24)
GFR, Estimated: >60 mL/min (ref >60)
Glucose, Bld: 161 mg/dL — ABNORMAL HIGH (ref 70–99)
Potassium: 3.7 mmol/L (ref 3.5–5.1)
Sodium: 139 mmol/L (ref 135–145)

## 2020-03-28 LAB — SARS CORONAVIRUS 2 (TAT 6-24 HRS): SARS Coronavirus 2: NEGATIVE

## 2020-03-28 LAB — CBG MONITORING, ED: Glucose-Capillary: 159 mg/dL — ABNORMAL HIGH (ref 70–99)

## 2020-03-28 MED ORDER — LORAZEPAM 2 MG/ML IJ SOLN
1.0000 mg | INTRAMUSCULAR | Status: DC | PRN
  Administered 2020-03-28: 1 mg via INTRAVENOUS
  Filled 2020-03-28: qty 1

## 2020-03-28 MED ORDER — PANTOPRAZOLE SODIUM 40 MG PO TBEC
40.0000 mg | DELAYED_RELEASE_TABLET | Freq: Every day | ORAL | Status: DC
  Administered 2020-03-29: 40 mg via ORAL
  Filled 2020-03-28: qty 1

## 2020-03-28 MED ORDER — GADOBUTROL 1 MMOL/ML IV SOLN
8.0000 mL | Freq: Once | INTRAVENOUS | Status: AC | PRN
  Administered 2020-03-28: 8 mL via INTRAVENOUS

## 2020-03-28 MED ORDER — FLECAINIDE ACETATE 50 MG PO TABS
50.0000 mg | ORAL_TABLET | Freq: Two times a day (BID) | ORAL | Status: DC
  Administered 2020-03-29: 50 mg via ORAL
  Filled 2020-03-28 (×2): qty 1

## 2020-03-28 MED ORDER — DILTIAZEM HCL 30 MG PO TABS
30.0000 mg | ORAL_TABLET | Freq: Two times a day (BID) | ORAL | Status: DC
  Administered 2020-03-29: 30 mg via ORAL
  Filled 2020-03-28 (×2): qty 1

## 2020-03-28 MED ORDER — ONDANSETRON HCL 4 MG/2ML IJ SOLN: 4.0000 mg | Freq: Four times a day (QID) | INTRAMUSCULAR | Status: DC | PRN

## 2020-03-28 MED ORDER — ACETAMINOPHEN 325 MG PO TABS
650.0000 mg | ORAL_TABLET | ORAL | Status: DC | PRN
  Administered 2020-03-29: 650 mg via ORAL
  Filled 2020-03-28: qty 2

## 2020-03-28 MED ORDER — ATORVASTATIN CALCIUM 10 MG PO TABS
20.0000 mg | ORAL_TABLET | Freq: Every day | ORAL | Status: DC
  Administered 2020-03-29: 20 mg via ORAL
  Filled 2020-03-28: qty 2

## 2020-03-28 MED ORDER — FERROUS SULFATE 325 (65 FE) MG PO TABS
325.0000 mg | ORAL_TABLET | Freq: Every day | ORAL | Status: DC
Start: 2020-03-29 — End: 2020-03-29
  Administered 2020-03-29: 325 mg via ORAL
  Filled 2020-03-28: qty 1

## 2020-03-28 NOTE — ED Provider Notes (Signed)
[facility] EMERGENCY DEPARTMENT Provider Note   CSN: [id number] Arrival date & time: 03/28/20  1013     History Chief Complaint  Patient presents with  . Near Syncope    Zachary Montoya is a 75 y.o. male.  The history is provided by the patient and medical records. No language interpreter was used.  Near Syncope     75 year old male history of paroxysmal atrial fibrillation recently placed on Eliquis on 03/03/2020 sent here from office via EMS for evaluation of syncope.  Patient reports 10 days ago he developed gradual headache.  Headache is described as a sharp achy sensation primarily to the left side of the head near his temple.  Headache has been waxing waning seems to improve when he lays flat but does worsen with positional change.  He also report intermittent left eye blurry vision during this time span.  He was seen by his eye specialist several days ago due to history of glaucoma.  He was told that he has a small retinal hemorrhage on the left eye and is scheduled to be seen by another eye specialist.  He also was seen by his cardiologist 7 days ago.  He did describe his headache and they recommend for him to stop taking his Eliquis.  He has been on Eliquis for the past week.  He was seen by his PCP today however in the waiting room he had a witnessed syncopal episode thus prompting this ER visit.  At this time he report his headache is minimal but only becomes symptomatic with positional change.  He denies feeling nauseous, dizziness, having focal numbness or focal weakness.  He denies having active chest pain but he did report having heart palpitation last night lasting for several hours and he did increase his flecainide for rate control.  He has been vaccinated for COVID-19 and denies any Covid symptoms.  Past Medical History:  Diagnosis Date  . 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 Elevated blood pressure reading without diagnosis of  hypertension 06/07/2014  . Hyperlipidemia 06/07/2014  . PAF (paroxysmal atrial fibrillation) ([place])   . Sleep apnea     Patient Active Problem List   Diagnosis Date Noted  . 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 Elevated blood pressure reading without diagnosis of hypertension 06/07/2014  . Hyperlipidemia 06/07/2014  . PAF (paroxysmal atrial fibrillation) ([place]) 06/07/2014    Past Surgical History:  Procedure Laterality Date  . NO PAST SURGERIES         Family History  Problem Relation Age of Onset  . Diabetes Mother   . Hypertension Mother   . Stroke Mother   . Cancer Brother   . Heart attack Neg Hx     Social History   Tobacco Use  . Smoking status: Never Smoker  . Smokeless tobacco: Never Used  Vaping Use  . Vaping Use: Never used  Substance Use Topics  . Alcohol use: No    Alcohol/week: 0.0 standard drinks  . Drug use: No    Home Medications Prior to Admission medications   Medication Sig Start Date End Date Taking? Authorizing Provider  atorvastatin (LIPITOR) 20 MG tablet Take 20 mg by mouth daily. 05/11/14   [provider]  Calcium Carbonate-Vitamin D (CALCIUM-D PO) Take 1 tablet by mouth daily. 600 mg calcium + 800 iu vit. d    [provider]  cetirizine (ZYRTEC) 10 MG tablet Take 10 mg by mouth daily as needed for  allergies or rhinitis.     [provider]  Cholecalciferol (VITAMIN D3) 2000 UNITS TABS Take 2,000 Units by mouth daily.    [provider]  Coenzyme Q10 (COQ10 PO) Take 300 mg by mouth daily.    [provider]  diltiazem (CARTIA XT) 120 MG 24 hr capsule Take 1 capsule (120 mg total) by mouth daily. 03/13/20   Camnitz, Ocie Doyne, MD  ferrous sulfate 325 (65 FE) MG tablet Take 325 mg by mouth daily.    [provider]  flecainide (TAMBOCOR) 50 MG tablet Take 1 tablet by mouth twice daily. 10/26/19   Camnitz, Ocie Doyne, MD  Multiple Vitamins-Minerals (CENTRUM ADULTS PO)  Take 1 tablet by mouth daily.    [provider]  Omega-3 Fatty Acids (OMEGA 3 PO) Take 1,280 mg by mouth daily.    [provider]  omeprazole (PRILOSEC) 20 MG capsule Take 20 mg by mouth daily. 05/10/14   [provider]  triamcinolone (NASACORT) 55 MCG/ACT AERO nasal inhaler Place 1 spray into the nose daily as needed (allergy season).     [provider]    Allergies    Timolol  Review of Systems   Review of Systems  Cardiovascular: Positive for near-syncope.  All other systems reviewed and are negative.   Physical Exam Updated Vital Signs BP 121/80 (BP Location: Left Arm)   Pulse 64   Temp 98.7 F (37.1 C) (Oral)   Resp 16   Ht 5\' 8"  (1.727 m)   Wt 85.8 kg   SpO2 100%   BMI 28.77 kg/m   Physical Exam Vitals and nursing note reviewed.  Constitutional:      General: He is not in acute distress.    Appearance: He is well-developed and well-nourished.  HENT:     Head: Normocephalic and atraumatic.     Nose: Nose normal.     Mouth/Throat:     Mouth: Mucous membranes are moist.  Eyes:     Extraocular Movements: Extraocular movements intact.     Conjunctiva/sclera: Conjunctivae normal.     Pupils: Pupils are equal, round, and reactive to light.  Cardiovascular:     Rate and Rhythm: Normal rate and regular rhythm.     Pulses: Normal pulses.     Heart sounds: Normal heart sounds.  Pulmonary:     Breath sounds: Normal breath sounds.  Abdominal:     General: There is no distension.     Palpations: Abdomen is soft.  Musculoskeletal:     Cervical back: Normal range of motion and neck supple. No rigidity.  Skin:    Findings: No rash.  Neurological:     Mental Status: He is alert and oriented to person, place, and time.     GCS: GCS eye subscore is 4. GCS verbal subscore is 5. GCS motor subscore is 6.     Cranial Nerves: Cranial nerves are intact.     Sensory: Sensation is intact.     Motor: Motor function is intact.      Coordination: Coordination is intact.  Psychiatric:        Mood and Affect: Mood and affect normal.     ED Results / Procedures / Treatments   Labs (all labs ordered are listed, but only abnormal results are displayed) Labs Reviewed  BASIC METABOLIC PANEL - Abnormal; Notable for the following components:      Result Value   Glucose, Bld 161 (*)    All other components within normal  limits  CBG MONITORING, ED - Abnormal; Notable for the following components:   Glucose-Capillary 159 (*)    All other components within normal limits  CBC  URINALYSIS, ROUTINE W REFLEX MICROSCOPIC  TROPONIN I (HIGH SENSITIVITY)  TROPONIN I (HIGH SENSITIVITY)    EKG EKG Interpretation  Date/Time:  Tuesday March 28 2020 10:42:12 EST Ventricular Rate:  67 PR Interval:  200 QRS Duration: 102 QT Interval:  440 QTC Calculation: 464 R Axis:   -42 Text Interpretation: Normal sinus rhythm Left axis deviation Abnormal ECG Confirmed by Madalyn Rob [phone]) on 03/28/2020 11:13:18 AM   Radiology CT HEAD WO CONTRAST  Result Date: 03/28/2020 CLINICAL DATA:  Simple syncope EXAM: CT HEAD WITHOUT CONTRAST TECHNIQUE: Contiguous axial images were obtained from the base of the skull through the vertex without intravenous contrast. COMPARISON:  None. FINDINGS: Brain: No evidence of acute infarction, hemorrhage, hydrocephalus, extra-axial collection or mass lesion/mass effect. Mild for age cortical atrophy. Vascular: Atheromatous calcification and vertebral tortuosity. Skull: Normal. Negative for fracture or focal lesion. Sinuses/Orbits: No acute finding. IMPRESSION: No acute or reversible finding. Electronically Signed   By: Monte Fantasia M.D.   On: 03/28/2020 11:06    Procedures Procedures   Medications Ordered in ED Medications  acetaminophen (TYLENOL) tablet 650 mg (has no administration in time range)  ondansetron (ZOFRAN) injection 4 mg (has no administration in time range)  LORazepam (ATIVAN)  injection 1 mg (1 mg Intravenous Given 03/28/20 1416)    ED Course  I have reviewed the triage vital signs and the nursing notes.  Pertinent labs & imaging results that were available during my care of the patient were reviewed by me and considered in my medical decision making (see chart for details).    MDM Rules/Calculators/A&P                          BP 121/80 (BP Location: Left Arm)   Pulse 64   Temp 98.7 F (37.1 C) (Oral)   Resp 16   Ht 5\' 8"  (1.727 m)   Wt 85.8 kg   SpO2 100%   BMI 28.77 kg/m   Final Clinical Impression(s) / ED Diagnoses Final diagnoses:  Syncope, unspecified syncope type  Recurrent headache    Rx / DC Orders ED Discharge Orders         Ordered    Amb referral to AFIB Clinic        03/28/20 1331         10:54 AM Patient with ongoing headache for 10 days, recently started on Eliquis for paroxysmal A. fib.  He also had syncopal episode today at the doctor's office when he went to evaluate for headache.  On exam he does not have any focal neuro deficit appreciated.  Vital signs stable.  Work-up initiated.  CT scan ordered.  Initial CBG is 159.  11:45 AM We were able to get in touch with patient's PCP who report patient was 2 separate witnessed syncopal episodes while at the office.  When patient was placed on the monitor, PCP noticed a long pause in the EKG after patient appears to be showing bradycardia rhythm. However upon capturing on the EKG, the rhythm normalized.    1:29 PM Labs are reassuring, initial head CT scan unremarkable.  I did consult with on-call neurology Dr. Theda Sers who recommend brain MRI along with head and neck MRA for further evaluation.  Appreciate consultation from on-call [facility] hospitalist Dr. Avon Gully who agrees  to admit patient.  He also requests cardiology to be involved as patient may have some EKG changes on initial evaluation by PCP today.  At this time patient is resting comfortably, patient is aware of plan and agrees  with plan.  2:37 PM Cardiology is aware of pt and will be involve in his care as well.     Domenic Moras, PA-C 03/28/20 1438    Lucrezia Starch, MD 03/29/20 1130

## 2020-03-28 NOTE — Consult Note (Signed)
Cardiology Consultation:   Patient ID: Zachary Montoya; [id number]; 03/23/1945   Admit date: 03/28/2020 Date of Consult: 03/28/2020  Primary Care Provider: Crist Infante, MD Primary Electrophysiologist: Dr. Allegra Lai, MD  Patient Profile:   Zachary Montoya is a 75 y.o. male with a hx of paroxysmal atrial fibrillation, sleep apnea and DM2 who is being seen today for the evaluation of syncope at the request of Dr. Avon Gully.  History of Present Illness:   [name] is a 75 year old male with a history as stated above who presented to [facility] on 03/28/2020 from his PCPs office after two witnessed episodes of syncope with collapse. Pt reports that he he is followed by Dr. Curt Bears for his PAF. He had previously deferred anticoagulation however after a recent episode of AF and with encouragement by his daughter in law, he started Eliquis approximately one month ago. After about two weeks, he began having occular and temporal headaches for which he was contributing to the Eliquis. He was seen in follow up with Dr. Curt Bears at which time the plan was for a 90 day respite from [facility] then start  Xarelto. Unfortunately his headaches have presisted despite the discontinuation of Eliquis. He then had an appointment today with his PCP to discuss the headaches. While in the exam room, he began having dizziness, diaphoresis with nausea and mild chest discomfort. Staff then lowered him to the floor at which time the patient reports that the room was dark. Per chart review, his BP was low. EKG was attempted with no capture of arrhthymia or pausing however ED PA called to speak with the PCP who noted a pause on the Zoll at the time of hook up. He was then transported to the ED for further evaluation. He continued to have chronic left temporal headache found to be worse with position changes. Given this, neurology was consulted with plans for brain MRI and head and neck MRA for further evaluation.  On EMS arrival, EKG  showed NSR with HR 67 bpm with Qtc at 464. HsT at 6>>5. K+ 3.7. Head CT showed no acute process.  Given syncope, cardiology and neurology were consulted. He does report that his most recent episode of AF was early this AM. He is relatively symptomatic with AF with palpitations and increased urination. He will typically have AF episodes around once a month, lasting approximately 6-10 hours in duration with eventual conversion to NSR. He denies recurrent chest pain and reports no prior anginal symptoms. He states that due to the headaches and hx of glaucoma, he was seen by his ophthalmologist who noted a retinal bleed and increased IOP.    Past Medical History:  Diagnosis Date  . 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 Elevated blood pressure reading without diagnosis of hypertension 06/07/2014  . Hyperlipidemia 06/07/2014  . PAF (paroxysmal atrial fibrillation) ([place])   . Sleep apnea     Past Surgical History:  Procedure Laterality Date  . NO PAST SURGERIES       Prior to Admission medications   Medication Sig Start Date End Date Taking? Authorizing Provider  atorvastatin (LIPITOR) 20 MG tablet Take 20 mg by mouth daily. 05/11/14  Yes [provider]  Calcium Carbonate-Vitamin D (CALCIUM-D PO) Take 1 tablet by mouth daily. 600 mg calcium + 800 iu vit. d   Yes [provider]  Cholecalciferol (VITAMIN D3) 2000 UNITS TABS Take 2,000 Units by mouth daily.   Yes [provider]  Coenzyme Q10 (  COQ10 PO) Take 300 mg by mouth daily.   Yes [provider]  diltiazem (CARTIA XT) 120 MG 24 hr capsule Take 1 capsule (120 mg total) by mouth daily. 03/13/20  Yes Camnitz, Ocie Doyne, MD  ferrous sulfate 325 (65 FE) MG tablet Take 325 mg by mouth daily.   Yes [provider]  flecainide (TAMBOCOR) 50 MG tablet Take 1 tablet by mouth twice daily. 10/26/19  Yes Camnitz, Will Hassell Done, MD  Multiple Vitamins-Minerals (CENTRUM ADULTS PO) Take 1 tablet by  mouth daily.   Yes [provider]  Omega-3 Fatty Acids (OMEGA 3 PO) Take 1,280 mg by mouth daily.   Yes [provider]  omeprazole (PRILOSEC) 20 MG capsule Take 20 mg by mouth daily. 05/10/14  Yes [provider]    Inpatient Medications: Scheduled Meds:  Continuous Infusions:  PRN Meds: acetaminophen, LORazepam, ondansetron (ZOFRAN) IV  Allergies:    Allergies  Allergen Reactions  . Timolol     Bradycardia/dizziness Other reaction(s): Dizziness, Dizziness (intolerance) Bradycardia/dizziness     Social History:   Social History   Socioeconomic History  . Marital status: Married    Spouse name: Not on file  . Number of children: Not on file  . Years of education: Not on file  . Highest education level: Not on file  Occupational History  . Not on file  Tobacco Use  . Smoking status: Never Smoker  . Smokeless tobacco: Never Used  Vaping Use  . Vaping Use: Never used  Substance and Sexual Activity  . Alcohol use: No    Alcohol/week: 0.0 standard drinks  . Drug use: No  . Sexual activity: Not on file  Other Topics Concern  . Not on file  Social History Narrative  . Not on file   Social Determinants of Health   Financial Resource Strain: Not on file  Food Insecurity: Not on file  Transportation Needs: Not on file  Physical Activity: Not on file  Stress: Not on file  Social Connections: Not on file  Intimate Partner Violence: Not on file    Family History:   Family History  Problem Relation Age of Onset  . Diabetes Mother   . Hypertension Mother   . Stroke Mother   . Cancer Brother   . Heart attack Neg Hx    Family Status:  Family Status  Relation Name Status  . Mother  Deceased  . Father  Deceased  . Brother  (Not Specified)  . MGM  Deceased  . MGF  Deceased  . PGM  Deceased  . PGF  Deceased  . Neg Hx  (Not Specified)    ROS:  Please see the history of present illness.  All other ROS reviewed and negative.      Physical Exam/Data:   Vitals:   03/28/20 1025 03/28/20 1032  BP: 121/80   Pulse: 64   Resp: 16   Temp: 98.7 F (37.1 C)   TempSrc: Oral   SpO2: 100%   Weight:  85.8 kg  Height:  5\' 8"  (1.727 m)   No intake or output data in the 24 hours ending 03/28/20 1501 Filed Weights   03/28/20 1032  Weight: 85.8 kg   Body mass index is 28.77 kg/m.   General: Well developed, well nourished, NAD Neck: Negative for carotid bruits. No JVD Lungs:Clear to ausculation bilaterally. No wheezes, rales, or rhonchi. Breathing is unlabored. Cardiovascular: RRR with S1 S2. No murmurs Abdomen: Soft, non-tender, non-distended. No obvious abdominal masses.  Extremities: No edema. Radial pulses 2+ bilaterally Neuro: Alert and oriented. No focal deficits. No facial asymmetry. MAE spontaneously. Psych: Responds to questions appropriately with normal affect.     EKG:  The EKG was personally reviewed and demonstrates: 03/28/20 NSR 67bpm and Qtc 464, no pauses or arrhthymias  Telemetry:  Telemetry was personally reviewed and demonstrates: 03/28/20 NSR   Relevant CV Studies:  ECHO 01/03/2015:  - Left ventricle: The cavity size was normal. There was mild  concentric hypertrophy. Systolic function was normal. The  estimated ejection fraction was in the range of 60% to 65%. Wall  motion was normal; there were no regional wall motion  abnormalities. Left ventricular diastolic function parameters  were normal.  - Aortic valve: There was trivial regurgitation.  - Right ventricle: The cavity size was normal. Wall thickness was  normal. Systolic function was normal.  - Tricuspid valve: There was trivial regurgitation.  - Pulmonic valve: There was trivial regurgitation.  - Inferior vena cava: The vessel was dilated. The respirophasic  diameter changes were blunted (< 50%), consistent with elevated  central venous pressure.  - Global longitudinal strain -16.3%   Laboratory  Data: Chemistry Recent Labs  Lab 03/28/20 1122  NA 139  K 3.7  CL 104  CO2 24  GLUCOSE 161*  BUN 15  CREATININE 1.21  CALCIUM 9.8  GFRNONAA >60  ANIONGAP 11    No results found for: PROT, ALBUMIN, AST, ALT, ALKPHOS, BILITOT Hematology Recent Labs  Lab 03/28/20 1122  WBC 10.4  RBC 4.97  HGB 16.4  HCT 47.1  MCV 94.8  MCH 33.0  MCHC 34.8  RDW 12.0  PLT 230   Cardiac EnzymesNo results for input(s): TROPONINI in the last 168 hours. No results for input(s): TROPIPOC in the last 168 hours.  BNPNo results for input(s): BNP, PROBNP in the last 168 hours.  DDimer No results for input(s): DDIMER in the last 168 hours. TSH: No results found for: TSH Lipids:No results found for: CHOL, HDL, LDLCALC, LDLDIRECT, TRIG, CHOLHDL HgbA1c:No results found for: HGBA1C  Radiology/Studies:  CT HEAD WO CONTRAST  Result Date: 03/28/2020 CLINICAL DATA:  Simple syncope EXAM: CT HEAD WITHOUT CONTRAST TECHNIQUE: Contiguous axial images were obtained from the base of the skull through the vertex without intravenous contrast. COMPARISON:  None. FINDINGS: Brain: No evidence of acute infarction, hemorrhage, hydrocephalus, extra-axial collection or mass lesion/mass effect. Mild for age cortical atrophy. Vascular: Atheromatous calcification and vertebral tortuosity. Skull: Normal. Negative for fracture or focal lesion. Sinuses/Orbits: No acute finding. IMPRESSION: No acute or reversible finding. Electronically Signed   By: Monte Fantasia M.D.   On: 03/28/2020 11:06   Assessment and Plan:   1. Syncope: -Pt presented to [facility] on 03/28/2020 from his PCPs office after two witnessed episodes of syncope with collapse. He had previously deferred anticoagulation however after a recent episode of AF and with encouragement by his daughter in law, he started Eliquis approximately one month ago. After about two weeks, he began having occular and temporal headaches for which he was contributing to the Eliquis. He was seen  in follow up with Dr. Curt Bears at which time the plan was for a 90 day respite from [facility] then start  Xarelto. Unfortunately his headaches have presisted despite the discontinuation of Eliquis. He then had an appointment today with his PCP to discuss the headaches. While in the exam room, he began having dizziness, diaphoresis with nausea and mild chest discomfort. Staff then lowered him to the floor at which  time the patient reports that the room was dark. Per chart review, his BP was low. EKG was attempted with no capture of arrhthymia or pausing however ED PA called to speak with the PCP who noted a pause on the Zoll at the time of hook up. -Pt reports most recent episode of AF was early today. I suspect based on his symptoms and no recurrent pausing that he suffered from post conversion pausing. He has been maintaining NSR with no further pausing or arrhthymias. EKG with stable HR and normal Qtc. FOr now, we will continue to monitor closely on telemetry. We will decrease his diltiazem to 30mg  BID and continue his flecainide. HsT are flat and normal which is inconsistent with ACS. Would hold off on anticoagulation given recent follow up with ophthalmologist who suggested increased IOP and retinal bleeding. Would continue current workup with neurology and IM for his headaches.      2. PAF: -Maintaining NSR in telemetry. Suspect that he suffered from a post conversion pause during his appointment earlier today. Will continue to monitor closely on tele and decrease his diltiazem to 30mg  BID -No AC for now given increased IOP and retinal bleed per patient report on recent follow up with ophthalmology -CHA2DS2VASc score 2  3. Headaches: -He was noted to have visual changes approximately 4 days after beginning Eliquis therefore this was discontinued with symptom improvement although he continued to have headaches. He was seen ealrier today by his PCP as noted above. Neurology has been consulted with head CT with  no acute changes. Brain MRI complete with pending results. Also planning head and neck MRA.  -Management per IM, neurology   For questions or updates, please contact [place] Please consult [email or web address] for contact info under Cardiology/STEMI.   SignedKathyrn Drown NP-C [facility] Pager: [phone] 03/28/2020 3:01 PM

## 2020-03-28 NOTE — ED Notes (Signed)
Patient transported to CT 

## 2020-03-28 NOTE — ED Triage Notes (Signed)
Patient arrived via EMS from the Dr's office, reported near syncope, cold, diaphoretic with low BP, and was assisted to the floor by staff and feet was elevated. Upon EMS arrival, reported stable vital signs w/ unremarkable EKG. Reported patient started on ELiquis on 03/03/2020 d/t afib + age, and started having headaches 01/22;

## 2020-03-28 NOTE — H&P (Signed)
History and Physical    Zachary Montoya [id number] DOB: 04-Jan-1946 DOA: 03/28/2020  PCP: Crist Infante, MD   Chief Complaint: Syncope  HPI: Zachary Montoya is a 75 y.o. male with medical history significant of paroxysmal atrial fibrillation on Eliquis as of 03/03/2020, sleep apnea, diet controlled diabetes who presents today from PCP office after multiple episodes of syncope and collapse.  Notably patient was at PCP office just today, within a few hours of admission, noted to have 2 episodes of syncope, at the office there was discussion of placing patient on EKG monitor to verify if cardiac in etiology without capture of the event.  Patient was noted to be quite bradycardic in PCP office and there was a question of cardiac pause but again no official documentation.  Patient endorses a chronic left temporal headache worse with position changes but otherwise denies any nausea vomiting chest pain shortness of breath diarrhea constipation headache fevers or chills.  ED Course: EKG shows normal sinus rhythm rate in the 60s, CT head shows no acute process.  Cardiology and neurology were consulted in the ED for further evaluation and treatment of syncope and [facility] was called for admission.  Review of Systems: As per HPI.   Assessment/Plan Active Problems:   Syncope    Acute syncopal episode, recurrent, POA Likely in the setting of bradycardia versus questionable cardiac pause  Rule out potential neurogenic causes -Patient has longstanding history of A. fib, paroxysmal  -Given his bradycardia and questionable cardiac pause noted at PCP office concern for sick sinus syndrome  -Cardiology following, patient follows with EP -Dr. Curt Bears in the outpatient setting who recently transitioned patient from Eliquis to [place] in the setting of dizziness and headaches but with no other medication changes -We discussed possible treatments for bradycardia include decreasing heart rate medications; Briefly  discussed role of pacemaker as well per family questions  -Neurology following as well, MRI pending ; no reported bowel or bladder function loss seizure-like activity or postictal state on interview   Paroxysmal A. fib on Xarelto -On diltiazem/flecainide per outpatient documentation -defer to cardiology for further medication management -Potential echo/stress test per cardiology -Eliquis currently on hold due to retinal bleed  Sleep apnea, noncompliant with CPAP -Offer CPAP, or allow home use  Diabetes mellitus type 2, diet controlled -Continue diabetic diet  DVT prophylaxis: Early ambulation, reported retinal bleed Code Status: Full Family Communication: Wife at bedside Status is: Inpatient  Dispo: The patient is from: Home              Anticipated d/c is to: Home              Anticipated d/c date is: 24 to 48 hours              Patient currently not medically stable for discharge  Consultants:   Cardiology, neurology  Procedures:   None, pending further work-up   Past Medical History:  Diagnosis Date  . 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 Elevated blood pressure reading without diagnosis of hypertension 06/07/2014  . Hyperlipidemia 06/07/2014  . PAF (paroxysmal atrial fibrillation) ([place])   . Sleep apnea     Past Surgical History:  Procedure Laterality Date  . NO PAST SURGERIES       reports that he has never smoked. He has never used smokeless tobacco. He reports that he does not drink alcohol and does not use drugs.  Allergies  Allergen Reactions  . Timolol  Bradycardia/dizziness Other reaction(s): Dizziness, Dizziness (intolerance) Bradycardia/dizziness     Family History  Problem Relation Age of Onset  . Diabetes Mother   . Hypertension Mother   . Stroke Mother   . Cancer Brother   . Heart attack Neg Hx     Prior to Admission medications   Medication Sig Start Date End Date Taking? Authorizing Provider  atorvastatin  (LIPITOR) 20 MG tablet Take 20 mg by mouth daily. 05/11/14   [provider]  Calcium Carbonate-Vitamin D (CALCIUM-D PO) Take 1 tablet by mouth daily. 600 mg calcium + 800 iu vit. d    [provider]  cetirizine (ZYRTEC) 10 MG tablet Take 10 mg by mouth daily as needed for allergies or rhinitis.     [provider]  Cholecalciferol (VITAMIN D3) 2000 UNITS TABS Take 2,000 Units by mouth daily.    [provider]  Coenzyme Q10 (COQ10 PO) Take 300 mg by mouth daily.    [provider]  diltiazem (CARTIA XT) 120 MG 24 hr capsule Take 1 capsule (120 mg total) by mouth daily. 03/13/20   Camnitz, Ocie Doyne, MD  ferrous sulfate 325 (65 FE) MG tablet Take 325 mg by mouth daily.    [provider]  flecainide (TAMBOCOR) 50 MG tablet Take 1 tablet by mouth twice daily. 10/26/19   Camnitz, Ocie Doyne, MD  Multiple Vitamins-Minerals (CENTRUM ADULTS PO) Take 1 tablet by mouth daily.    [provider]  Omega-3 Fatty Acids (OMEGA 3 PO) Take 1,280 mg by mouth daily.    [provider]  omeprazole (PRILOSEC) 20 MG capsule Take 20 mg by mouth daily. 05/10/14   [provider]  triamcinolone (NASACORT) 55 MCG/ACT AERO nasal inhaler Place 1 spray into the nose daily as needed (allergy season).     [provider]    Physical Exam: Vitals:   03/28/20 1025 03/28/20 1032  BP: 121/80   Pulse: 64   Resp: 16   Temp: 98.7 F (37.1 C)   TempSrc: Oral   SpO2: 100%   Weight:  85.8 kg  Height:  5\' 8"  (1.727 m)    Constitutional: NAD, calm, comfortable Vitals:   03/28/20 1025 03/28/20 1032  BP: 121/80   Pulse: 64   Resp: 16   Temp: 98.7 F (37.1 C)   TempSrc: Oral   SpO2: 100%   Weight:  85.8 kg  Height:  5\' 8"  (1.727 m)   General:  Pleasantly resting in bed, No acute distress. HEENT:  Normocephalic atraumatic.  Sclerae nonicteric, noninjected.  Extraocular movements intact bilaterally. Neck:  Without mass or  deformity.  Trachea is midline. Lungs:  Clear to auscultate bilaterally without rhonchi, wheeze, or rales. Heart:  Regular rate and rhythm.  Without murmurs, rubs, or gallops. Abdomen:  Soft, nontender, nondistended.  Without guarding or rebound. Extremities: Without cyanosis, clubbing, edema, or obvious deformity. Vascular:  Dorsalis pedis and posterior tibial pulses palpable bilaterally. Skin:  Warm and dry, no erythema, no ulcerations.  Labs on Admission: I have personally reviewed following labs and imaging studies  CBC: Recent Labs  Lab 03/28/20 1122  WBC 10.4  HGB 16.4  HCT 47.1  MCV 94.8  PLT 702   Basic Metabolic Panel: Recent Labs  Lab 03/28/20 1122  NA 139  K 3.7  CL 104  CO2 24  GLUCOSE 161*  BUN 15  CREATININE 1.21  CALCIUM 9.8   GFR: Estimated Creatinine Clearance: 57.1 mL/min (by C-G formula based on SCr  of 1.21 mg/dL). Liver Function Tests: No results for input(s): AST, ALT, ALKPHOS, BILITOT, PROT, ALBUMIN in the last 168 hours. No results for input(s): LIPASE, AMYLASE in the last 168 hours. No results for input(s): AMMONIA in the last 168 hours. Coagulation Profile: No results for input(s): INR, PROTIME in the last 168 hours. Cardiac Enzymes: No results for input(s): CKTOTAL, CKMB, CKMBINDEX, TROPONINI in the last 168 hours. BNP (last 3 results) No results for input(s): PROBNP in the last 8760 hours. HbA1C: No results for input(s): HGBA1C in the last 72 hours. CBG: Recent Labs  Lab 03/28/20 1045  GLUCAP 159*   Lipid Profile: No results for input(s): CHOL, HDL, LDLCALC, TRIG, CHOLHDL, LDLDIRECT in the last 72 hours. Thyroid Function Tests: No results for input(s): TSH, T4TOTAL, FREET4, T3FREE, THYROIDAB in the last 72 hours. Anemia Panel: No results for input(s): VITAMINB12, FOLATE, FERRITIN, TIBC, IRON, RETICCTPCT in the last 72 hours. Urine analysis: No results found for: COLORURINE, APPEARANCEUR, LABSPEC, PHURINE, GLUCOSEU, HGBUR,  BILIRUBINUR, KETONESUR, PROTEINUR, UROBILINOGEN, NITRITE, LEUKOCYTESUR  Radiological Exams on Admission: CT HEAD WO CONTRAST  Result Date: 03/28/2020 CLINICAL DATA:  Simple syncope EXAM: CT HEAD WITHOUT CONTRAST TECHNIQUE: Contiguous axial images were obtained from the base of the skull through the vertex without intravenous contrast. COMPARISON:  None. FINDINGS: Brain: No evidence of acute infarction, hemorrhage, hydrocephalus, extra-axial collection or mass lesion/mass effect. Mild for age cortical atrophy. Vascular: Atheromatous calcification and vertebral tortuosity. Skull: Normal. Negative for fracture or focal lesion. Sinuses/Orbits: No acute finding. IMPRESSION: No acute or reversible finding. Electronically Signed   By: Monte Fantasia M.D.   On: 03/28/2020 11:06    EKG: Independently reviewed. NSR   Little Ishikawa DO [facility] For contact please use secure messenger on Epic  If 7PM-7AM, please contact night-coverage located on [email or web address]   03/28/2020, 1:31 PM

## 2020-03-28 NOTE — ED Notes (Signed)
Patient transported to MRI 

## 2020-03-29 ENCOUNTER — Observation Stay (HOSPITAL_BASED_OUTPATIENT_CLINIC_OR_DEPARTMENT_OTHER): Payer: Medicare HMO

## 2020-03-29 ENCOUNTER — Observation Stay (INDEPENDENT_AMBULATORY_CARE_PROVIDER_SITE_OTHER): Payer: Medicare HMO

## 2020-03-29 DIAGNOSIS — R55 Syncope and collapse: Secondary | ICD-10-CM

## 2020-03-29 LAB — BASIC METABOLIC PANEL
Anion gap: 10 (ref 5–15)
BUN: 18 mg/dL (ref 8–23)
CO2: 23 mmol/L (ref 22–32)
Calcium: 9.4 mg/dL (ref 8.9–10.3)
Chloride: 104 mmol/L (ref 98–111)
Creatinine, Ser: 0.99 mg/dL (ref 0.61–1.24)
GFR, Estimated: >60 mL/min (ref >60)
Glucose, Bld: 121 mg/dL — ABNORMAL HIGH (ref 70–99)
Potassium: 4 mmol/L (ref 3.5–5.1)
Sodium: 137 mmol/L (ref 135–145)

## 2020-03-29 LAB — CBC
HCT: 46.6 % (ref 39.0–52.0)
Hemoglobin: 15.5 g/dL (ref 13.0–17.0)
MCH: 32.4 pg (ref 26.0–34.0)
MCHC: 33.3 g/dL (ref 30.0–36.0)
MCV: 97.3 fL (ref 80.0–100.0)
Platelets: 214 10*3/uL (ref 150–400)
RBC: 4.79 MIL/uL (ref 4.22–5.81)
RDW: 12.1 % (ref 11.5–15.5)
WBC: 8.9 10*3/uL (ref 4.0–10.5)
nRBC: 0 % (ref 0.0–0.2)

## 2020-03-29 LAB — ECHOCARDIOGRAM COMPLETE
Area-P 1/2: 3.37 cm2
Calc EF: 72 %
Height: 68 in
S' Lateral: 2.2 cm
Single Plane A2C EF: 76.3 %
Single Plane A4C EF: 67 %
Weight: 3027.2 oz

## 2020-03-29 MED ORDER — METOPROLOL SUCCINATE ER 25 MG PO TB24
25.0000 mg | ORAL_TABLET | Freq: Every day | ORAL | Status: DC
  Administered 2020-03-29: 25 mg via ORAL
  Filled 2020-03-29: qty 1

## 2020-03-29 MED ORDER — METOPROLOL SUCCINATE ER 25 MG PO TB24: 25.0000 mg | ORAL_TABLET | Freq: Every day | ORAL | 0 refills | Status: DC

## 2020-03-29 MED ORDER — FLECAINIDE ACETATE 100 MG PO TABS
100.0000 mg | ORAL_TABLET | Freq: Two times a day (BID) | ORAL | Status: DC
  Filled 2020-03-29: qty 1

## 2020-03-29 MED ORDER — FLECAINIDE ACETATE 100 MG PO TABS: 100.0000 mg | ORAL_TABLET | Freq: Two times a day (BID) | ORAL | 1 refills | Status: DC

## 2020-03-29 MED ORDER — METOPROLOL SUCCINATE ER 25 MG PO TB24: 25.0000 mg | ORAL_TABLET | Freq: Every day | ORAL | 1 refills | Status: DC

## 2020-03-29 NOTE — Progress Notes (Signed)
Zio patch placed onto patient.  All instructions and information reviewed with patient, they verbalize understanding with no questions. 

## 2020-03-29 NOTE — Discharge Summary (Signed)
Physician Discharge Summary  Zachary Montoya [id number] DOB: [birth year] DOA: 03/28/2020  PCP: Crist Infante, MD  Admit date: 03/28/2020 Discharge date: 03/29/2020  Admitted From: Home Disposition: Home  Recommendations for Outpatient Follow-up:  1. Follow up with PCP in 1-2 weeks 2. Please obtain BMP/CBC in one week 3. Please follow up with cardiology as scheduled in the next 1 to 2 weeks:  Home Health: None Equipment/Devices: Holter monitor  Discharge Condition: Stable CODE STATUS: Full Diet recommendation: Low-carb low-salt diet  Brief/Interim Summary: Zachary Montoya is a 75 y.o. male with medical history significant of paroxysmal atrial fibrillation on Eliquis as of 03/03/2020, sleep apnea, diet controlled diabetes who presents today from PCP office after multiple episodes of syncope and collapse.  Notably patient was at PCP office just today, within a few hours of admission, noted to have 2 episodes of syncope, at the office there was discussion of placing patient on EKG monitor to verify if cardiac in etiology without capture of the event.  Patient was noted to be quite bradycardic in PCP office and there was a question of cardiac pause but again no official documentation.  Patient endorses a chronic left temporal headache worse with position changes but otherwise denies any nausea vomiting chest pain shortness of breath diarrhea constipation headache fevers or chills.  Patient admitted as above with episode of syncope with associated bradycardia and questionable associated hypotension.  Patient's medications were changed during hospitalization, increase flecainide to 100 mg twice daily with discontinuation of diltiazem, Toprol-XL 25 daily as well.  Patient will be discharged on Holter monitor, close follow-up with cardiology and PCP in the next 1 to 2 weeks per schedule.  Patient's only complaint ongoing is is somewhat chronic headache, given his negative imaging here would have patient  evaluated for atypical cluster headache versus tension headache by PCP.  Patient is currently off of his anticoagulation due to retinal bleed being followed in the outpatient setting by ophthalmology.  Continue Xarelto once cleared by ophthalmology.  Patient otherwise stable and agreeable for discharge home, wife at bedside was also educated on close monitoring in the setting of medication changes and we discussed avoiding precarious positions including driving, unsupervised heights/similar where patient could have an unwitnessed fall or accident.  Discharge Diagnoses:  Active Problems:   Syncope    Discharge Instructions  Discharge Instructions    Amb referral to [facility]   Complete by: As directed    Call MD for:  difficulty breathing, headache or visual disturbances   Complete by: As directed    Call MD for:  extreme fatigue   Complete by: As directed    Diet - low sodium heart healthy   Complete by: As directed    Increase activity slowly   Complete by: As directed      Allergies as of 03/29/2020      Reactions   Timolol    Bradycardia/dizziness Other reaction(s): Dizziness, Dizziness (intolerance) Bradycardia/dizziness      Medication List    STOP taking these medications   diltiazem 120 MG 24 hr capsule Commonly known as: Cartia XT     TAKE these medications   atorvastatin 20 MG tablet Commonly known as: LIPITOR Take 20 mg by mouth daily.   CALCIUM-D PO Take 1 tablet by mouth daily. 600 mg calcium + 800 iu vit. d   CENTRUM ADULTS PO Take 1 tablet by mouth daily.   COQ10 PO Take 300 mg by mouth daily.   ferrous sulfate 325 (  65 FE) MG tablet Take 325 mg by mouth daily.   flecainide 100 MG tablet Commonly known as: TAMBOCOR Take 1 tablet (100 mg total) by mouth every 12 (twelve) hours. What changed:   medication strength  how much to take  when to take this   metoprolol succinate 25 MG 24 hr tablet Commonly known as: TOPROL-XL Take 1 tablet (25  mg total) by mouth daily.   OMEGA 3 PO Take 1,280 mg by mouth daily.   omeprazole 20 MG capsule Commonly known as: PRILOSEC Take 20 mg by mouth daily.   Vitamin D3 50 MCG (2000 UT) Tabs Take 2,000 Units by mouth daily.       Allergies  Allergen Reactions  . Timolol     Bradycardia/dizziness Other reaction(s): Dizziness, Dizziness (intolerance) Bradycardia/dizziness     Consultations:  Cardiology   Procedures/Studies: CT HEAD WO CONTRAST  Result Date: 03/28/2020 CLINICAL DATA:  Simple syncope EXAM: CT HEAD WITHOUT CONTRAST TECHNIQUE: Contiguous axial images were obtained from the base of the skull through the vertex without intravenous contrast. COMPARISON:  None. FINDINGS: Brain: No evidence of acute infarction, hemorrhage, hydrocephalus, extra-axial collection or mass lesion/mass effect. Mild for age cortical atrophy. Vascular: Atheromatous calcification and vertebral tortuosity. Skull: Normal. Negative for fracture or focal lesion. Sinuses/Orbits: No acute finding. IMPRESSION: No acute or reversible finding. Electronically Signed   By: Monte Fantasia M.D.   On: 03/28/2020 11:06   MR ANGIO HEAD WO CONTRAST  Result Date: 03/28/2020 CLINICAL DATA:  Syncope, headache EXAM: MRI HEAD WITHOUT CONTRAST MRA HEAD WITHOUT CONTRAST MRA NECK WITHOUT AND WITH CONTRAST TECHNIQUE: Multiplanar, multiecho pulse sequences of the brain and surrounding structures were obtained without intravenous contrast. Angiographic images of the Circle of Willis were obtained using MRA technique without intravenous contrast. Angiographic images of the neck were obtained using MRA technique without and with intravenous contrast. Carotid stenosis measurements (when applicable) are obtained utilizing NASCET criteria, using the distal internal carotid diameter as the denominator. CONTRAST:  37mL GADAVIST GADOBUTROL 1 MMOL/ML IV SOLN COMPARISON:  None. FINDINGS: MRI HEAD Brain: There is no acute infarction or  intracranial hemorrhage. There is no intracranial mass, mass effect, or edema. There is no hydrocephalus or extra-axial fluid collection. Ventricles and sulci are within normal limits in size and configuration. Minimal small foci of T2 hyperintensity in the supratentorial white matter are nonspecific but could reflect minor chronic microvascular ischemic changes. Vascular: Major vessel flow voids at the skull base are preserved. Skull and upper cervical spine: Normal marrow signal is preserved. Sinuses/Orbits: Paranasal sinuses are aerated. Orbits are unremarkable. Other: Sella is unremarkable.  Mastoid air cells are clear. MRA HEAD Intracranial internal carotid arteries are patent. Middle and anterior cerebral arteries are patent. Intracranial vertebral arteries, basilar artery, posterior cerebral arteries are patent. Right posterior communicating artery is identified. There is no significant stenosis or aneurysm. MRA NECK Common, internal, and external carotid arteries are patent. Extracranial vertebral arteries are patent. There is no hemodynamically significant stenosis or evidence of dissection. IMPRESSION: No acute infarction, hemorrhage, or mass. No large vessel occlusion, hemodynamically significant stenosis, or evidence of dissection. Electronically Signed   By: Macy Mis M.D.   On: 03/28/2020 15:22   MR Angiogram Neck W or Wo Contrast  Result Date: 03/28/2020 CLINICAL DATA:  Syncope, headache EXAM: MRI HEAD WITHOUT CONTRAST MRA HEAD WITHOUT CONTRAST MRA NECK WITHOUT AND WITH CONTRAST TECHNIQUE: Multiplanar, multiecho pulse sequences of the brain and surrounding structures were obtained without intravenous contrast. Angiographic images of  the Circle of Willis were obtained using MRA technique without intravenous contrast. Angiographic images of the neck were obtained using MRA technique without and with intravenous contrast. Carotid stenosis measurements (when applicable) are obtained utilizing  NASCET criteria, using the distal internal carotid diameter as the denominator. CONTRAST:  32mL GADAVIST GADOBUTROL 1 MMOL/ML IV SOLN COMPARISON:  None. FINDINGS: MRI HEAD Brain: There is no acute infarction or intracranial hemorrhage. There is no intracranial mass, mass effect, or edema. There is no hydrocephalus or extra-axial fluid collection. Ventricles and sulci are within normal limits in size and configuration. Minimal small foci of T2 hyperintensity in the supratentorial white matter are nonspecific but could reflect minor chronic microvascular ischemic changes. Vascular: Major vessel flow voids at the skull base are preserved. Skull and upper cervical spine: Normal marrow signal is preserved. Sinuses/Orbits: Paranasal sinuses are aerated. Orbits are unremarkable. Other: Sella is unremarkable.  Mastoid air cells are clear. MRA HEAD Intracranial internal carotid arteries are patent. Middle and anterior cerebral arteries are patent. Intracranial vertebral arteries, basilar artery, posterior cerebral arteries are patent. Right posterior communicating artery is identified. There is no significant stenosis or aneurysm. MRA NECK Common, internal, and external carotid arteries are patent. Extracranial vertebral arteries are patent. There is no hemodynamically significant stenosis or evidence of dissection. IMPRESSION: No acute infarction, hemorrhage, or mass. No large vessel occlusion, hemodynamically significant stenosis, or evidence of dissection. Electronically Signed   By: Macy Mis M.D.   On: 03/28/2020 15:22   MR BRAIN WO CONTRAST  Result Date: 03/28/2020 CLINICAL DATA:  Syncope, headache EXAM: MRI HEAD WITHOUT CONTRAST MRA HEAD WITHOUT CONTRAST MRA NECK WITHOUT AND WITH CONTRAST TECHNIQUE: Multiplanar, multiecho pulse sequences of the brain and surrounding structures were obtained without intravenous contrast. Angiographic images of the Circle of Willis were obtained using MRA technique without  intravenous contrast. Angiographic images of the neck were obtained using MRA technique without and with intravenous contrast. Carotid stenosis measurements (when applicable) are obtained utilizing NASCET criteria, using the distal internal carotid diameter as the denominator. CONTRAST:  65mL GADAVIST GADOBUTROL 1 MMOL/ML IV SOLN COMPARISON:  None. FINDINGS: MRI HEAD Brain: There is no acute infarction or intracranial hemorrhage. There is no intracranial mass, mass effect, or edema. There is no hydrocephalus or extra-axial fluid collection. Ventricles and sulci are within normal limits in size and configuration. Minimal small foci of T2 hyperintensity in the supratentorial white matter are nonspecific but could reflect minor chronic microvascular ischemic changes. Vascular: Major vessel flow voids at the skull base are preserved. Skull and upper cervical spine: Normal marrow signal is preserved. Sinuses/Orbits: Paranasal sinuses are aerated. Orbits are unremarkable. Other: Sella is unremarkable.  Mastoid air cells are clear. MRA HEAD Intracranial internal carotid arteries are patent. Middle and anterior cerebral arteries are patent. Intracranial vertebral arteries, basilar artery, posterior cerebral arteries are patent. Right posterior communicating artery is identified. There is no significant stenosis or aneurysm. MRA NECK Common, internal, and external carotid arteries are patent. Extracranial vertebral arteries are patent. There is no hemodynamically significant stenosis or evidence of dissection. IMPRESSION: No acute infarction, hemorrhage, or mass. No large vessel occlusion, hemodynamically significant stenosis, or evidence of dissection. Electronically Signed   By: Macy Mis M.D.   On: 03/28/2020 15:22      Subjective: No acute issues or events overnight, feels quite well otherwise denies nausea vomiting diarrhea constipation headache fevers chills chest pain shortness of breath.   Discharge  Exam: Vitals:   03/29/20 1045 03/29/20 1100  BP:  118/79 (!) 133/103  Pulse: 63 68  Resp: 16 14  Temp:    SpO2: 94% 97%   Vitals:   03/29/20 1015 03/29/20 1030 03/29/20 1045 03/29/20 1100  BP: 116/78 (!) 136/92 118/79 (!) 133/103  Pulse: 64 70 63 68  Resp: 16 15 16 14   Temp:      TempSrc:      SpO2: 94% 97% 94% 97%  Weight:      Height:        General: Pt is alert, awake, not in acute distress Cardiovascular: RRR, S1/S2 +, no rubs, no gallops Respiratory: CTA bilaterally, no wheezing, no rhonchi Abdominal: Soft, NT, ND, bowel sounds + Extremities: no edema, no cyanosis    The results of significant diagnostics from this hospitalization (including imaging, microbiology, ancillary and laboratory) are listed below for reference.     Microbiology: Recent Results (from the past 240 hour(s))  SARS CORONAVIRUS 2 (TAT 6-24 HRS) Nasopharyngeal Nasopharyngeal Swab     Status: None   Collection Time: 03/28/20  3:51 PM   Specimen: Nasopharyngeal Swab  Result Value Ref Range Status   SARS Coronavirus 2 NEGATIVE NEGATIVE Final    Comment: (NOTE) SARS-CoV-2 target nucleic acids are NOT DETECTED.  The SARS-CoV-2 RNA is generally detectable in upper and lower respiratory specimens during the acute phase of infection. Negative results do not preclude SARS-CoV-2 infection, do not rule out co-infections with other pathogens, and should not be used as the sole basis for treatment or other patient management decisions. Negative results must be combined with clinical observations, patient history, and epidemiological information. The expected result is Negative.  Fact Sheet for Patients: SugarRoll.be  Fact Sheet for Healthcare Providers: https://www.woods-mathews.com/  This test is not yet approved or cleared by the [place] FDA and  has been authorized for detection and/or diagnosis of SARS-CoV-2 by FDA under an Emergency Use  Authorization (EUA). This EUA will remain  in effect (meaning this test can be used) for the duration of the COVID-19 declaration under Se ction 564(b)(1) of the Act, 21 U.S.C. section 360bbb-3(b)(1), unless the authorization is terminated or revoked sooner.  Performed at [facility], [place] [address] [place],  [address]      Labs: BNP (last 3 results) No results for input(s): BNP in the last 8760 hours. Basic Metabolic Panel: Recent Labs  Lab 03/28/20 1122 03/29/20 0220  NA 139 137  K 3.7 4.0  CL 104 104  CO2 24 23  GLUCOSE 161* 121*  BUN 15 18  CREATININE 1.21 0.99  CALCIUM 9.8 9.4   Liver Function Tests: No results for input(s): AST, ALT, ALKPHOS, BILITOT, PROT, ALBUMIN in the last 168 hours. No results for input(s): LIPASE, AMYLASE in the last 168 hours. No results for input(s): AMMONIA in the last 168 hours. CBC: Recent Labs  Lab 03/28/20 1122 03/29/20 0220  WBC 10.4 8.9  HGB 16.4 15.5  HCT 47.1 46.6  MCV 94.8 97.3  PLT 230 214   Cardiac Enzymes: No results for input(s): CKTOTAL, CKMB, CKMBINDEX, TROPONINI in the last 168 hours. BNP: Invalid input(s): POCBNP CBG: Recent Labs  Lab 03/28/20 1045  GLUCAP 159*   D-Dimer No results for input(s): DDIMER in the last 72 hours. Hgb A1c No results for input(s): HGBA1C in the last 72 hours. Lipid Profile No results for input(s): CHOL, HDL, LDLCALC, TRIG, CHOLHDL, LDLDIRECT in the last 72 hours. Thyroid function studies No results for input(s): TSH, T4TOTAL, T3FREE, THYROIDAB in the last 72 hours.  Invalid input(s): FREET3 Anemia work up No results for input(s): VITAMINB12, FOLATE, FERRITIN, TIBC, IRON, RETICCTPCT in the last 72 hours. Urinalysis    Component Value Date/Time   COLORURINE YELLOW 03/28/2020 1624   APPEARANCEUR CLEAR 03/28/2020 1624   LABSPEC 1.015 03/28/2020 1624   PHURINE 8.5 (H) 03/28/2020 1624   GLUCOSEU NEGATIVE 03/28/2020 1624   HGBUR NEGATIVE 03/28/2020 1624    BILIRUBINUR NEGATIVE 03/28/2020 1624   KETONESUR NEGATIVE 03/28/2020 1624   PROTEINUR NEGATIVE 03/28/2020 1624   NITRITE NEGATIVE 03/28/2020 1624   LEUKOCYTESUR NEGATIVE 03/28/2020 1624   Sepsis Labs Invalid input(s): PROCALCITONIN,  WBC,  LACTICIDVEN Microbiology Recent Results (from the past 240 hour(s))  SARS CORONAVIRUS 2 (TAT 6-24 HRS) Nasopharyngeal Nasopharyngeal Swab     Status: None   Collection Time: 03/28/20  3:51 PM   Specimen: Nasopharyngeal Swab  Result Value Ref Range Status   SARS Coronavirus 2 NEGATIVE NEGATIVE Final    Comment: (NOTE) SARS-CoV-2 target nucleic acids are NOT DETECTED.  The SARS-CoV-2 RNA is generally detectable in upper and lower respiratory specimens during the acute phase of infection. Negative results do not preclude SARS-CoV-2 infection, do not rule out co-infections with other pathogens, and should not be used as the sole basis for treatment or other patient management decisions. Negative results must be combined with clinical observations, patient history, and epidemiological information. The expected result is Negative.  Fact Sheet for Patients: SugarRoll.be  Fact Sheet for Healthcare Providers: https://www.woods-mathews.com/  This test is not yet approved or cleared by the [place] FDA and  has been authorized for detection and/or diagnosis of SARS-CoV-2 by FDA under an Emergency Use Authorization (EUA). This EUA will remain  in effect (meaning this test can be used) for the duration of the COVID-19 declaration under Se ction 564(b)(1) of the Act, 21 U.S.C. section 360bbb-3(b)(1), unless the authorization is terminated or revoked sooner.  Performed at [facility], [place] [address] [place], [place] [address]      Time coordinating discharge: Over 30 minutes  SIGNED:   Little Ishikawa, DO [facility] 03/29/2020, 11:48 AM Pager   If 7PM-7AM, please contact  night-coverage [email or web address]

## 2020-03-29 NOTE — Progress Notes (Signed)
  Echocardiogram 2D Echocardiogram has been performed.  Zachary Montoya 03/29/2020, 1:38 PM

## 2020-03-29 NOTE — Consult Note (Addendum)
Cardiology Consultation:   Patient ID: Zachary Montoya MRN: [id number]; DOB: 19-Nov-1945  Admit date: 03/28/2020 Date of Consult: 03/29/2020  Primary Care Provider: Crist Infante, MD [facility] Cardiologist: Dr. Irish Lack (2017) [facility] Electrophysiologist:  Elliannah Wayment Meredith Leeds, MD   Patient Profile:   Zachary Montoya is a 75 y.o. male with a hx of OSA w/CPAP, HLD, DM (diet controlled), AFib who is being seen today for the evaluation of recurrent syncope at the request of Dr. Oval Linsey.   AFib hx Pt reported likely goes back to the 80's based on symptoms  AAD Flecainide started May 2020  History of Present Illness:   Mr. Bloodsaw was seen recently 03/23/20 and c/o new headaches/vision changes since starting eliquis, outside of that doing OK, minimal AF burden.  Was decided to hold a/c monitor symptoms and if better off Eliquis, try Xarelto, if HA persisted off then would neurology.  Headache is reported as worsened with movement.  He saw his eye specialist several days ago due to history of glaucoma.  He was told that he has a small retinal hemorrhage on the left eye and is scheduled to be seen by another eye specialist.  He reported the evening prior to going to the PMD he had palpitations and took an extra dose of flecainide  The patient yesterday went to see his PMD and while there 2 episodes of syncope, seems as they were placing the EKG leads on, saw perhaps a long pause, though once able to make a tracing was in normal rhythm. There is some mention of being bradycardic, again without clear documentation No EMS notes are available.  He was admitted, neurology and cardiology consulted. Cardiology reduced his dilt with some suspicion of post termination pause. Head CT and MRI/MRA look OK  LABS K+ 3.7, 4.0 BUN/Creat 15/1.21 and 18/0.99 HS Trop 6, 5 WBC 8.9 H/H 15/46 Plts 214  EKG SR with stable intervals  In my d/w the patient he was at his PMD yesterday  because of the headache.  This is new in the last 2 weeks and unusual for him, worsened with movement and reports a 3/10 pain scale. Shortly after being brought to the exam room he was talking he thinks with a student taking his history when he started feeling nauseas and weak, perhaps some element of CP that was fleeting, he mentioned to the person the that he was not feeling well, and was getting worse. He reports a hazy but independent memory of being helped to the floor and his legs lifted, recalls BPs being taken, but can only recall the number 57,  He remembers them placing the EKG, and says that at some point was helped back to the chair and that is when things started going completely black for him. He does not know a time/duration, but does say that he felt quite poorly hours and did he start to feel well again until about noon yesterday (he was at his PMD at 0900). He recalls the ambulance ride but the whole time feeling very weak. He recalls someone mentioning he was diaphoretic.  He thinks he was in Afib the evening before and going to the primary's office based on symptoms of is palpitations.  His headache persists unchanged, has not been out of the stretcher to know if movement still makes him feel poorly, also said the visual issue is mornings only and usually clears in a couple hours.   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 Authorizing Provider  atorvastatin (LIPITOR) 20 MG tablet Take 20 mg by mouth daily. 05/11/14  Yes [provider]  Calcium Carbonate-Vitamin D  (CALCIUM-D PO) Take 1 tablet by mouth daily. 600 mg calcium + 800 iu vit. d   Yes [provider]  Cholecalciferol (VITAMIN D3) 2000 UNITS TABS Take 2,000 Units by mouth daily.   Yes [provider]  Coenzyme Q10 (COQ10 PO) Take 300 mg by mouth daily.   Yes [provider]  diltiazem (CARTIA XT) 120 MG 24 hr capsule Take 1 capsule (120 mg total) by mouth daily. 03/13/20  Yes Isabele Lollar, Ocie Doyne, MD  ferrous sulfate 325 (65 FE) MG tablet Take 325 mg by mouth daily.   Yes [provider]  flecainide (TAMBOCOR) 50 MG tablet Take 1 tablet by mouth twice daily. 10/26/19  Yes Sophiah Rolin Hassell Done, MD  Multiple Vitamins-Minerals (CENTRUM ADULTS PO) Take 1 tablet by mouth daily.   Yes [provider]  Omega-3 Fatty Acids (OMEGA 3 PO) Take 1,280 mg by mouth daily.   Yes [provider]  omeprazole (PRILOSEC) 20 MG capsule Take 20 mg by mouth daily. 05/10/14  Yes [provider]    Inpatient Medications: Scheduled Meds:  atorvastatin  20 mg Oral Daily   diltiazem  30 mg Oral BID   ferrous sulfate  325 mg Oral Daily   flecainide  50 mg Oral BID   pantoprazole  40 mg Oral Daily   Continuous Infusions:   PRN Meds: acetaminophen, LORazepam, ondansetron (ZOFRAN) IV  Allergies:    Allergies  Allergen Reactions   Timolol     Bradycardia/dizziness Other reaction(s): Dizziness, Dizziness (intolerance) Bradycardia/dizziness     Social History:   Social History   Socioeconomic History   Marital status: Married    Spouse name: Not on file   Number of children: Not on file   Years of education: Not on file   Highest education level: Not on file  Occupational History   Not on file  Tobacco Use   Smoking status: Never Smoker   Smokeless tobacco: Never Used  Vaping Use   Vaping Use: Never used  Substance and Sexual Activity   Alcohol use: No    Alcohol/week: 0.0 standard drinks   Drug use: No   Sexual activity: Not on file   Other Topics Concern   Not on file  Social History Narrative   Not on file   Social Determinants of Health   Financial Resource Strain: Not on file  Food Insecurity: Not on file  Transportation Needs: Not on file  Physical Activity: Not on file  Stress: Not on file  Social Connections: Not on file  Intimate Partner Violence: Not on file    Family History:   Family History  Problem Relation Age of Onset   Diabetes Mother    Hypertension Mother    Stroke Mother    Cancer Brother    Heart attack Neg Hx      ROS:  Please see the history of present illness.  All other ROS reviewed and negative.  Physical Exam/Data:   Vitals:   03/29/20 0430 03/29/20 0500 03/29/20 0530 03/29/20 0630  BP: 106/76 109/75 118/75 113/73  Pulse: (!) 57 (!) 58 (!) 58 73  Resp: 14 13 16 16   Temp: 98.9 F (37.2 C)     TempSrc: Oral     SpO2: 95% 92% 96% 97%  Weight:      Height:       No intake or output data in the 24 hours ending 03/29/20 0836 Last 3 Weights 03/28/2020 03/23/2020 12/30/2019  Weight (lbs) 189 lb 3.2 oz 199 lb 6.4 oz 210 lb 3.2 oz  Weight (kg) 85.821 kg 90.447 kg 95.346 kg     Body mass index is 28.77 kg/m.  General:  Well nourished, well developed, in no acute distress HEENT: normal Lymph: no adenopathy Neck: no JVD Endocrine:  No thryomegaly Vascular: No carotid bruits Cardiac:  RRR; no murmurs, gallops or rubs Lungs:  CTA b/l, no wheezing, rhonchi or rales  Abd: soft, nontender  Ext: no edema Musculoskeletal:  No deformities Skin: warm and dry  Neuro:  No gross focal motor abnormalities noted Psych:  Normal affect   EKG:  The EKG was personally reviewed and demonstrates:    SR 67bpm, PR 241ms, QRS 162ms, QTc 411ms  EKG at his bedside (date is cut off on copy), presumably from his PMD yesterday is SR64bpm, stable intervals  Telemetry:  Telemetry was personally reviewed and demonstrates:   SB 50's overnight, 60's-70's daytime    Relevant CV  Studies:  TTE 01/03/15 - Left ventricle: The cavity size was normal. There was mild   concentric hypertrophy. Systolic function was normal. The   estimated ejection fraction was in the range of 60% to 65%. Wall   motion was normal; there were no regional wall motion   abnormalities. Left ventricular diastolic function parameters   were normal. - Aortic valve: There was trivial regurgitation. - Right ventricle: The cavity size was normal. Wall thickness was   normal. Systolic function was normal. - Tricuspid valve: There was trivial regurgitation. - Pulmonic valve: There was trivial regurgitation. - Inferior vena cava: The vessel was dilated. The respirophasic   diameter changes were blunted (< 50%), consistent with elevated   central venous pressure. - Global longitudinal strain -16.3%   Telemetry 06/28/15  NSR with Paroxysmal Atrial fibrillation. Atrial fibrillation 3% of the time.   Laboratory Data:  High Sensitivity Troponin:   Recent Labs  Lab 03/28/20 1122 03/28/20 1331  TROPONINIHS 6 5     Chemistry Recent Labs  Lab 03/28/20 1122 03/29/20 0220  NA 139 137  K 3.7 4.0  CL 104 104  CO2 24 23  GLUCOSE 161* 121*  BUN 15 18  CREATININE 1.21 0.99  CALCIUM 9.8 9.4  GFRNONAA >60 >60  ANIONGAP 11 10    No results for input(s): PROT, ALBUMIN, AST, ALT, ALKPHOS, BILITOT in the last 168 hours. Hematology Recent Labs  Lab 03/28/20 1122 03/29/20 0220  WBC 10.4 8.9  RBC 4.97 4.79  HGB 16.4 15.5  HCT 47.1 46.6  MCV 94.8 97.3  MCH 33.0 32.4  MCHC 34.8 33.3  RDW 12.0 12.1  PLT 230 214   BNPNo results for input(s): BNP, PROBNP in the last 168 hours.  DDimer No results for input(s): DDIMER in the last 168 hours.   Radiology/Studies:   CT HEAD WO CONTRAST Result Date: 03/28/2020 CLINICAL DATA:  Simple syncope EXAM: CT HEAD WITHOUT CONTRAST TECHNIQUE: Contiguous axial images were obtained from the  base of the skull through the vertex without intravenous contrast.  COMPARISON:  None. FINDINGS: Brain: No evidence of acute infarction, hemorrhage, hydrocephalus, extra-axial collection or mass lesion/mass effect. Mild for age cortical atrophy. Vascular: Atheromatous calcification and vertebral tortuosity. Skull: Normal. Negative for fracture or focal lesion. Sinuses/Orbits: No acute finding. IMPRESSION: No acute or reversible finding. Electronically Signed   By: Monte Fantasia M.D.   On: 03/28/2020 11:06    MR ANGIO HEAD WO CONTRAST Result Date: 03/28/2020 CLINICAL DATA:  Syncope, headache EXAM: MRI HEAD WITHOUT CONTRAST MRA HEAD WITHOUT CONTRAST MRA NECK WITHOUT AND WITH CONTRAST TECHNIQUE: Multiplanar, multiecho pulse sequences of the brain and surrounding structures were obtained without intravenous contrast. Angiographic images of the Circle of Willis were obtained using MRA technique without intravenous contrast. Angiographic images of the neck were obtained using MRA technique without and with intravenous contrast. Carotid stenosis measurements (when applicable) are obtained utilizing NASCET criteria, using the distal internal carotid diameter as the denominator. CONTRAST:  101mL GADAVIST GADOBUTROL 1 MMOL/ML IV SOLN COMPARISON:  None. FINDINGS: MRI HEAD Brain: There is no acute infarction or intracranial hemorrhage. There is no intracranial mass, mass effect, or edema. There is no hydrocephalus or extra-axial fluid collection. Ventricles and sulci are within normal limits in size and configuration. Minimal small foci of T2 hyperintensity in the supratentorial white matter are nonspecific but could reflect minor chronic microvascular ischemic changes. Vascular: Major vessel flow voids at the skull base are preserved. Skull and upper cervical spine: Normal marrow signal is preserved. Sinuses/Orbits: Paranasal sinuses are aerated. Orbits are unremarkable. Other: Sella is unremarkable.  Mastoid air cells are clear. MRA HEAD Intracranial internal carotid arteries are patent.  Middle and anterior cerebral arteries are patent. Intracranial vertebral arteries, basilar artery, posterior cerebral arteries are patent. Right posterior communicating artery is identified. There is no significant stenosis or aneurysm. MRA NECK Common, internal, and external carotid arteries are patent. Extracranial vertebral arteries are patent. There is no hemodynamically significant stenosis or evidence of dissection. IMPRESSION: No acute infarction, hemorrhage, or mass. No large vessel occlusion, hemodynamically significant stenosis, or evidence of dissection. Electronically Signed   By: Macy Mis M.D.   On: 03/28/2020 15:22      Assessment and Plan:   1. Syncope     There was perhaps long pause/bradycardi noted as ECG was bein placed, though by the time a tracing was able to be made, was reported as normal       Discussed above Given duration of feeling poorly and his recollection of symptoms and events I wonder if BP/vagal was not part of this picture EMS notes are reviewed Initial BP by them was 111/47, 60bpm, pt reported still feeling weak but some improved  Cards has decreased his dilt 120mg  daily to 30mg  BID Can not r/o bradycardia as part of this picture, though given the duration of his symptoms, less likely post termination pause    2. Paroxysmal AFib     CHA2DS2Vasc is 2     As discussed above, was off eliquis 2/2 headaches >> now with some reports of retinal bleed, and not on [place] for xarelto when cleared ny neurology/opthomology      Stable intervals on Flecainide             Dr. Curt Bears Meliah Appleman see later today Suspect increase in his flecainide and zio AT monitoring with close out pt follow up  Further deferred to IM and neurology teams  ADDEND: Dr. Curt Bears has seen and examined the patient Stop Cardizem Start Toprol XL 25,g daily Increase Flecainide to 100mg  BID Place Zio AT monitor prior to discharge (ordered and EKG department is  aware)  Risk Assessment/Risk Scores:  {      For questions or updates, please contact [facility] Please consult [email or web address] for contact info under    Signed, Baldwin Jamaica, PA-C  03/29/2020 8:36 AM;  I have seen and examined this patient with Tommye Standard.  Agree with above, note added to reflect my findings.  On exam, regular rhythm, no murmurs.  Presented to the hospital after an episode of syncope.  He also has been having quite severe headaches.  He was initially put on when his headache started.  Eliquis stopped.  He continued to have some eye issues and went to his ophthalmologist and was found to have a retinal bleed and has an upcoming appointment with a retinal specialist.  He did have an episode of syncope.  He felt quite poorly after his episode.  It does not sound that his episode was cardiac related, though it would be beneficial to adjust his medications.  We Toshia Larkin stop his diltiazem and put him on Toprol-XL 25 and increase his flecainide to 100 mg.  We Cortni Tays also place a 14-day monitor to check for any arrhythmias that could lead to syncope.  Crissy Mccreadie M. Dylen Mcelhannon MD 03/29/2020 11:47 AM

## 2020-03-29 NOTE — Care Management CC44 (Signed)
Condition Code 44 Documentation Completed  Patient Details  Name: Zachary Montoya MRN: [id number] Date of Birth: 10-Jun-1945   Condition Code 44 given:  Yes Patient signature on Condition Code 44 notice:  Yes Documentation of 2 MD's agreement:  Yes Code 44 added to claim:  Yes    Fuller Mandril, RN 03/29/2020, 12:26 PM

## 2020-03-29 NOTE — ED Notes (Signed)
Patient steady on his feet to walk to the in suite bathroom. Wife and patient rec'd update from cards and hospitalist re: plan of care and plan for DC home. Patient alert and ready to go home.

## 2020-03-29 NOTE — ED Notes (Signed)
Breakfast Ordered 

## 2020-03-30 ENCOUNTER — Telehealth: Payer: Self-pay | Admitting: Cardiology

## 2020-03-30 DIAGNOSIS — R55 Syncope and collapse: Secondary | ICD-10-CM | POA: Diagnosis not present

## 2020-03-30 MED ORDER — METOPROLOL SUCCINATE ER 25 MG PO TB24: 25.0000 mg | ORAL_TABLET | Freq: Every day | ORAL | 11 refills | Status: DC

## 2020-03-30 NOTE — Telephone Encounter (Signed)
*  STAT* If patient is at the pharmacy, call can be transferred to refill team.   1. Which medications need to be refilled? (please list name of each medication and dose if known)  metoprolol succinate (TOPROL-XL) 25 MG 24 hr tablet  2. Which pharmacy/location (including street and city if local pharmacy) is medication to be sent to? [facility] # [place], [place]  3. Do they need a 30 day or 90 day supply? 30  Patient needs an rx sent to his local pharmacy to pick up.

## 2020-03-30 NOTE — Telephone Encounter (Signed)
Pt's medication was sent to pt's pharmacy as requested. Confirmation received.  °

## 2020-04-03 DIAGNOSIS — I480 Paroxysmal atrial fibrillation: Secondary | ICD-10-CM | POA: Diagnosis not present

## 2020-04-03 DIAGNOSIS — R55 Syncope and collapse: Secondary | ICD-10-CM | POA: Diagnosis not present

## 2020-04-06 DIAGNOSIS — H401113 Primary open-angle glaucoma, right eye, severe stage: Secondary | ICD-10-CM | POA: Diagnosis not present

## 2020-04-06 DIAGNOSIS — H3562 Retinal hemorrhage, left eye: Secondary | ICD-10-CM | POA: Diagnosis not present

## 2020-04-06 DIAGNOSIS — H2513 Age-related nuclear cataract, bilateral: Secondary | ICD-10-CM | POA: Diagnosis not present

## 2020-04-06 DIAGNOSIS — H43393 Other vitreous opacities, bilateral: Secondary | ICD-10-CM | POA: Diagnosis not present

## 2020-04-06 DIAGNOSIS — H401123 Primary open-angle glaucoma, left eye, severe stage: Secondary | ICD-10-CM | POA: Diagnosis not present

## 2020-04-10 DIAGNOSIS — M5414 Radiculopathy, thoracic region: Secondary | ICD-10-CM | POA: Diagnosis not present

## 2020-04-10 DIAGNOSIS — M9902 Segmental and somatic dysfunction of thoracic region: Secondary | ICD-10-CM | POA: Diagnosis not present

## 2020-04-10 DIAGNOSIS — M530 Cervicocranial syndrome: Secondary | ICD-10-CM | POA: Diagnosis not present

## 2020-04-10 DIAGNOSIS — M9901 Segmental and somatic dysfunction of cervical region: Secondary | ICD-10-CM | POA: Diagnosis not present

## 2020-04-11 DIAGNOSIS — M530 Cervicocranial syndrome: Secondary | ICD-10-CM | POA: Diagnosis not present

## 2020-04-11 DIAGNOSIS — M5414 Radiculopathy, thoracic region: Secondary | ICD-10-CM | POA: Diagnosis not present

## 2020-04-11 DIAGNOSIS — M9902 Segmental and somatic dysfunction of thoracic region: Secondary | ICD-10-CM | POA: Diagnosis not present

## 2020-04-11 DIAGNOSIS — M9901 Segmental and somatic dysfunction of cervical region: Secondary | ICD-10-CM | POA: Diagnosis not present

## 2020-04-12 DIAGNOSIS — M5414 Radiculopathy, thoracic region: Secondary | ICD-10-CM | POA: Diagnosis not present

## 2020-04-12 DIAGNOSIS — M9902 Segmental and somatic dysfunction of thoracic region: Secondary | ICD-10-CM | POA: Diagnosis not present

## 2020-04-12 DIAGNOSIS — M9901 Segmental and somatic dysfunction of cervical region: Secondary | ICD-10-CM | POA: Diagnosis not present

## 2020-04-12 DIAGNOSIS — M530 Cervicocranial syndrome: Secondary | ICD-10-CM | POA: Diagnosis not present

## 2020-04-13 DIAGNOSIS — M9902 Segmental and somatic dysfunction of thoracic region: Secondary | ICD-10-CM | POA: Diagnosis not present

## 2020-04-13 DIAGNOSIS — M9901 Segmental and somatic dysfunction of cervical region: Secondary | ICD-10-CM | POA: Diagnosis not present

## 2020-04-13 DIAGNOSIS — M530 Cervicocranial syndrome: Secondary | ICD-10-CM | POA: Diagnosis not present

## 2020-04-13 DIAGNOSIS — M5414 Radiculopathy, thoracic region: Secondary | ICD-10-CM | POA: Diagnosis not present

## 2020-04-14 NOTE — Telephone Encounter (Signed)
Followed up with pt to inform him that it would next week before advisement from MD being that he is out of the office this week. Advised that it will most likely be his new normal as long as he is asymptomatic.  Pt reports Toprol was started 2/3. He has a "general overall lack of energy with slight balance issues" since starting.  He currently is taking it with his evening meal. He experiences "huge fatigue" just walking "16 steps from my office to the bathroom". Pt made aware MD may have him try the Toprol at bedtime vs meal time, or may have him try something different all together.  He would also like to discuss blood thinner further.  If Zachary Montoya is recommending starting he would like to talk about alternatives, such as Watchman.   Aware will follow up next week. Pt agreeable to plan.

## 2020-04-17 DIAGNOSIS — M5414 Radiculopathy, thoracic region: Secondary | ICD-10-CM | POA: Diagnosis not present

## 2020-04-17 DIAGNOSIS — M9901 Segmental and somatic dysfunction of cervical region: Secondary | ICD-10-CM | POA: Diagnosis not present

## 2020-04-17 DIAGNOSIS — M530 Cervicocranial syndrome: Secondary | ICD-10-CM | POA: Diagnosis not present

## 2020-04-17 DIAGNOSIS — M9902 Segmental and somatic dysfunction of thoracic region: Secondary | ICD-10-CM | POA: Diagnosis not present

## 2020-04-18 DIAGNOSIS — M530 Cervicocranial syndrome: Secondary | ICD-10-CM | POA: Diagnosis not present

## 2020-04-18 DIAGNOSIS — M9901 Segmental and somatic dysfunction of cervical region: Secondary | ICD-10-CM | POA: Diagnosis not present

## 2020-04-18 DIAGNOSIS — M9902 Segmental and somatic dysfunction of thoracic region: Secondary | ICD-10-CM | POA: Diagnosis not present

## 2020-04-18 DIAGNOSIS — M5414 Radiculopathy, thoracic region: Secondary | ICD-10-CM | POA: Diagnosis not present

## 2020-04-19 DIAGNOSIS — M5414 Radiculopathy, thoracic region: Secondary | ICD-10-CM | POA: Diagnosis not present

## 2020-04-19 DIAGNOSIS — H401123 Primary open-angle glaucoma, left eye, severe stage: Secondary | ICD-10-CM | POA: Diagnosis not present

## 2020-04-19 DIAGNOSIS — M9901 Segmental and somatic dysfunction of cervical region: Secondary | ICD-10-CM | POA: Diagnosis not present

## 2020-04-19 DIAGNOSIS — H401113 Primary open-angle glaucoma, right eye, severe stage: Secondary | ICD-10-CM | POA: Diagnosis not present

## 2020-04-19 DIAGNOSIS — M530 Cervicocranial syndrome: Secondary | ICD-10-CM | POA: Diagnosis not present

## 2020-04-19 DIAGNOSIS — M9902 Segmental and somatic dysfunction of thoracic region: Secondary | ICD-10-CM | POA: Diagnosis not present

## 2020-04-20 DIAGNOSIS — M5414 Radiculopathy, thoracic region: Secondary | ICD-10-CM | POA: Diagnosis not present

## 2020-04-20 DIAGNOSIS — M9901 Segmental and somatic dysfunction of cervical region: Secondary | ICD-10-CM | POA: Diagnosis not present

## 2020-04-20 DIAGNOSIS — M530 Cervicocranial syndrome: Secondary | ICD-10-CM | POA: Diagnosis not present

## 2020-04-20 DIAGNOSIS — M9902 Segmental and somatic dysfunction of thoracic region: Secondary | ICD-10-CM | POA: Diagnosis not present

## 2020-04-24 DIAGNOSIS — M9901 Segmental and somatic dysfunction of cervical region: Secondary | ICD-10-CM | POA: Diagnosis not present

## 2020-04-24 DIAGNOSIS — M530 Cervicocranial syndrome: Secondary | ICD-10-CM | POA: Diagnosis not present

## 2020-04-24 DIAGNOSIS — M9902 Segmental and somatic dysfunction of thoracic region: Secondary | ICD-10-CM | POA: Diagnosis not present

## 2020-04-24 DIAGNOSIS — M5414 Radiculopathy, thoracic region: Secondary | ICD-10-CM | POA: Diagnosis not present

## 2020-04-28 ENCOUNTER — Other Ambulatory Visit: Payer: Self-pay

## 2020-04-28 ENCOUNTER — Ambulatory Visit: Payer: Medicare HMO | Admitting: Student

## 2020-04-28 ENCOUNTER — Encounter: Payer: Self-pay | Admitting: Student

## 2020-04-28 VITALS — BP 130/70 | HR 61 | Ht 68.0 in | Wt 194.0 lb

## 2020-04-28 DIAGNOSIS — R55 Syncope and collapse: Secondary | ICD-10-CM | POA: Diagnosis not present

## 2020-04-28 DIAGNOSIS — I480 Paroxysmal atrial fibrillation: Secondary | ICD-10-CM

## 2020-04-28 NOTE — Progress Notes (Signed)
PCP:  Crist Infante, MD Primary Cardiologist: No primary care provider on file. Electrophysiologist: Will Meredith Leeds, MD   Zachary Montoya is a 75 y.o. male OSA, PAF, and DM2 seen today for Will Meredith Leeds, MD for post hospital follow up.  Since last being seen in our clinic the patient reports doing well. He has had no lightheadedness, syncope, near syncope or palpitations. He remains off [place]. He was always are in the past when he was having AF, but typically would last at a maximum in the 18 hr range. He would prefer to remain off of [place] if at all possible with retinal hemorrhage  Past Medical History:  Diagnosis Date  . 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 Elevated blood pressure reading without diagnosis of hypertension 06/07/2014  . Hyperlipidemia 06/07/2014  . PAF (paroxysmal atrial fibrillation) ([place])   . Sleep apnea    Past Surgical History:  Procedure Laterality Date  . NO PAST SURGERIES      Current Outpatient Medications  Medication Sig Dispense Refill  . atorvastatin (LIPITOR) 20 MG tablet Take 20 mg by mouth daily.  3  . B Complex-C (B-COMPLEX WITH VITAMIN C) tablet Take 1 tablet by mouth daily.    . Calcium Carbonate-Vitamin D (CALCIUM-D PO) Take 1 tablet by mouth daily. 600 mg calcium + 800 iu vit. d    . cetirizine (ZYRTEC) 10 MG tablet Take by mouth as needed.    . Cholecalciferol (VITAMIN D3) 2000 UNITS TABS Take 2,000 Units by mouth daily.    . Coenzyme Q10 (COQ10 PO) Take 300 mg by mouth daily.    . ferrous sulfate 325 (65 FE) MG tablet Take 325 mg by mouth daily.    . flecainide (TAMBOCOR) 100 MG tablet Take 1 tablet (100 mg total) by mouth 2 (two) times daily. 60 tablet 1  . metoprolol succinate (TOPROL-XL) 25 MG 24 hr tablet Take 1 tablet (25 mg total) by mouth daily. 30 tablet 11  . Multiple Vitamins-Minerals (CENTRUM ADULTS PO) Take 1 tablet by mouth daily.    . Omega-3 Fatty Acids (OMEGA 3 PO) Take 1,280 mg by mouth daily.    Marland Kitchen  omeprazole (PRILOSEC) 20 MG capsule Take 20 mg by mouth daily.  3   No current facility-administered medications for this visit.    Allergies  Allergen Reactions  . Timolol     Bradycardia/dizziness Other reaction(s): Dizziness, Dizziness (intolerance) Bradycardia/dizziness     Social History   Socioeconomic History  . Marital status: Married    Spouse name: Not on file  . Number of children: Not on file  . Years of education: Not on file  . Highest education level: Not on file  Occupational History  . Not on file  Tobacco Use  . Smoking status: Never Smoker  . Smokeless tobacco: Never Used  Vaping Use  . Vaping Use: Never used  Substance and Sexual Activity  . Alcohol use: No    Alcohol/week: 0.0 standard drinks  . Drug use: No  . Sexual activity: Not on file  Other Topics Concern  . Not on file  Social History Narrative  . Not on file   Social Determinants of Health   Financial Resource Strain: Not on file  Food Insecurity: Not on file  Transportation Needs: Not on file  Physical Activity: Not on file  Stress: Not on file  Social Connections: Not on file  Intimate Partner Violence: Not on file     Review of  Systems: General: No chills, fever, night sweats or weight changes  Cardiovascular:  No chest pain, dyspnea on exertion, edema, orthopnea, palpitations, paroxysmal nocturnal dyspnea Dermatological: No rash, lesions or masses Respiratory: No cough, dyspnea Urologic: No hematuria, dysuria Abdominal: No nausea, vomiting, diarrhea, bright red blood per rectum, melena, or hematemesis Neurologic: No visual changes, weakness, changes in mental status All other systems reviewed and are otherwise negative except as noted above.  Physical Exam: Vitals:   04/28/20 1216  BP: 130/70  Pulse: 61  SpO2: 97%  Weight: 194 lb (88 kg)  Height: 5\' 8"  (1.727 m)    GEN- The patient is well appearing, alert and oriented x 3 today.   HEENT: normocephalic,  atraumatic; sclera clear, conjunctiva pink; hearing intact; oropharynx clear; neck supple, no JVP Lymph- no cervical lymphadenopathy Lungs- Clear to ausculation bilaterally, normal work of breathing.  No wheezes, rales, rhonchi Heart- Regular rate and rhythm, no murmurs, rubs or gallops, PMI not laterally displaced GI- soft, non-tender, non-distended, bowel sounds present, no hepatosplenomegaly Extremities- no clubbing, cyanosis, or edema; DP/PT/radial pulses 2+ bilaterally MS- no significant deformity or atrophy Skin- warm and dry, no rash or lesion Psych- euthymic mood, full affect Neuro- strength and sensation are intact  EKG is not ordered.   Additional studies reviewed include: EP office notes. ED notes  14 day Zio patch 03/2020 Max 124 bpm 01:02pm, 02/04 Min 40 bpm 01:57am, 02/11 Avg 55 bpm <1% ventricular and supraventricular ectopy Predominant rhythm was sinus rhythm One run of SVT, 18 beats at 119 BPM No other arrhthymias noted No symptoms reported   Assessment and Plan:  1. Paroxysmal atrial fibrillation Continue flecainide and diltiazem He had severe HAs on eliquis and transitioned to Xarelto. CHA2DS2VASC of at least 2. Currently holding Xarelto in setting of retinal hemorrhage. Continue holding for at least 90 day (total).   I have recommended mobile prn monitoring for him with either an Apple Watch or Kardia device. He is very interested in the latter. That way he can submit EKGs through MyChart if he feels he is in AF, and we can re-address [place] if needed  2. OSA Encouraged nightly CPAP use  3. Syncope Initial reports were concerning for pause/bradycardia, but given duration of his symptoms EP exam and history felt to be more consistent with BP/vagal component.  Zio patch 03/2020 as above primarily NSR, no significant pauses or events. 1 short SVT.  Continue to monitor symptomatically.   He states he was told in the hospital to remain off of [place] for at least 90  days. Will schedule appointment with Dr. Curt Bears at the end of that window to further address.  We did discuss the risk of stroke as increased being off [place] and the importance of alerting our office if he feels he is in AF, or going in and out.   Shirley Friar, PA-C  04/28/20 12:22 PM

## 2020-04-28 NOTE — Patient Instructions (Signed)
Medication Instructions:  Your physician recommends that you continue on your current medications as directed. Please refer to the Current Medication list given to you today.  *If you need a refill on your cardiac medications before your next appointment, please call your pharmacy*   Lab Work: None Today If you have labs (blood work) drawn today and your tests are completely normal, you will receive your results only by: Marland Kitchen MyChart Message (if you have MyChart) OR . A paper copy in the mail If you have any lab test that is abnormal or we need to change your treatment, we will call you to review the results.   Follow-Up: At [facility], you and your health needs are our priority.  As part of our continuing mission to provide you with exceptional heart care, we have created designated Provider Care Teams.  These Care Teams include your primary Cardiologist (physician) and Advanced Practice Providers (APPs -  Physician Assistants and Nurse Practitioners) who all work together to provide you with the care you need, when you need it.   Your next appointment:   07/06/2020  The format for your next appointment:   In Person  Provider:   Allegra Lai, MD   Other Instructions [employer]  FDA-cleared EKG at your fingertips. - [employer]   Agricultural engineer, [employer]. [email or web address]   FDA-cleared, clinical grade mobile EKG monitor: Jodelle Red is the most clinically-validated mobile EKG used by the world's leading cardiac care medical professionals.  This may be useful in monitoring palpitations.  We do not have access to have them emailed and reviewed but will be glad to review while in the office.

## 2020-05-02 MED ORDER — FLECAINIDE ACETATE 100 MG PO TABS: 100.0000 mg | ORAL_TABLET | Freq: Two times a day (BID) | ORAL | 3 refills | Status: DC

## 2020-05-16 DIAGNOSIS — M5414 Radiculopathy, thoracic region: Secondary | ICD-10-CM | POA: Diagnosis not present

## 2020-05-16 DIAGNOSIS — M9901 Segmental and somatic dysfunction of cervical region: Secondary | ICD-10-CM | POA: Diagnosis not present

## 2020-05-16 DIAGNOSIS — M9902 Segmental and somatic dysfunction of thoracic region: Secondary | ICD-10-CM | POA: Diagnosis not present

## 2020-05-16 DIAGNOSIS — M530 Cervicocranial syndrome: Secondary | ICD-10-CM | POA: Diagnosis not present

## 2020-05-22 DIAGNOSIS — D649 Anemia, unspecified: Secondary | ICD-10-CM | POA: Diagnosis not present

## 2020-05-22 DIAGNOSIS — E669 Obesity, unspecified: Secondary | ICD-10-CM | POA: Diagnosis not present

## 2020-05-22 DIAGNOSIS — H409 Unspecified glaucoma: Secondary | ICD-10-CM | POA: Diagnosis not present

## 2020-05-22 DIAGNOSIS — Z6830 Body mass index (BMI) 30.0-30.9, adult: Secondary | ICD-10-CM | POA: Diagnosis not present

## 2020-05-22 DIAGNOSIS — G4730 Sleep apnea, unspecified: Secondary | ICD-10-CM | POA: Diagnosis not present

## 2020-05-22 DIAGNOSIS — K219 Gastro-esophageal reflux disease without esophagitis: Secondary | ICD-10-CM | POA: Diagnosis not present

## 2020-05-22 DIAGNOSIS — I4891 Unspecified atrial fibrillation: Secondary | ICD-10-CM | POA: Diagnosis not present

## 2020-05-22 DIAGNOSIS — R030 Elevated blood-pressure reading, without diagnosis of hypertension: Secondary | ICD-10-CM | POA: Diagnosis not present

## 2020-05-22 DIAGNOSIS — J309 Allergic rhinitis, unspecified: Secondary | ICD-10-CM | POA: Diagnosis not present

## 2020-05-22 DIAGNOSIS — E785 Hyperlipidemia, unspecified: Secondary | ICD-10-CM | POA: Diagnosis not present

## 2020-06-02 ENCOUNTER — Other Ambulatory Visit: Payer: Self-pay

## 2020-06-02 ENCOUNTER — Ambulatory Visit
Admission: RE | Admit: 2020-06-02 | Discharge: 2020-06-02 | Disposition: A | Discharge status: Home / Self Care | Payer: Medicare HMO | Source: Ambulatory Visit | Attending: Physician Assistant | Admitting: Physician Assistant

## 2020-06-02 ENCOUNTER — Other Ambulatory Visit: Payer: Self-pay | Admitting: Physician Assistant

## 2020-06-02 DIAGNOSIS — K5792 Diverticulitis of intestine, part unspecified, without perforation or abscess without bleeding: Secondary | ICD-10-CM

## 2020-06-02 DIAGNOSIS — K5732 Diverticulitis of large intestine without perforation or abscess without bleeding: Secondary | ICD-10-CM | POA: Diagnosis not present

## 2020-06-02 DIAGNOSIS — K5901 Slow transit constipation: Secondary | ICD-10-CM | POA: Diagnosis not present

## 2020-06-02 DIAGNOSIS — I878 Other specified disorders of veins: Secondary | ICD-10-CM | POA: Diagnosis not present

## 2020-06-06 ENCOUNTER — Ambulatory Visit
Admission: RE | Admit: 2020-06-06 | Discharge: 2020-06-06 | Disposition: A | Discharge status: Home / Self Care | Payer: Medicare HMO | Source: Ambulatory Visit | Attending: Physician Assistant | Admitting: Physician Assistant

## 2020-06-06 DIAGNOSIS — K429 Umbilical hernia without obstruction or gangrene: Secondary | ICD-10-CM | POA: Diagnosis not present

## 2020-06-06 DIAGNOSIS — K5792 Diverticulitis of intestine, part unspecified, without perforation or abscess without bleeding: Secondary | ICD-10-CM

## 2020-06-06 DIAGNOSIS — K449 Diaphragmatic hernia without obstruction or gangrene: Secondary | ICD-10-CM | POA: Diagnosis not present

## 2020-06-06 DIAGNOSIS — N400 Enlarged prostate without lower urinary tract symptoms: Secondary | ICD-10-CM | POA: Diagnosis not present

## 2020-06-06 DIAGNOSIS — K314 Gastric diverticulum: Secondary | ICD-10-CM | POA: Diagnosis not present

## 2020-06-06 MED ORDER — IOPAMIDOL (ISOVUE-300) INJECTION 61%
100.0000 mL | Freq: Once | INTRAVENOUS | Status: AC | PRN
  Administered 2020-06-06: 100 mL via INTRAVENOUS

## 2020-06-15 DIAGNOSIS — K5732 Diverticulitis of large intestine without perforation or abscess without bleeding: Secondary | ICD-10-CM | POA: Diagnosis not present

## 2020-06-15 DIAGNOSIS — D126 Benign neoplasm of colon, unspecified: Secondary | ICD-10-CM | POA: Diagnosis not present

## 2020-06-15 DIAGNOSIS — K5901 Slow transit constipation: Secondary | ICD-10-CM | POA: Diagnosis not present

## 2020-06-26 DIAGNOSIS — E781 Pure hyperglyceridemia: Secondary | ICD-10-CM | POA: Diagnosis not present

## 2020-06-26 DIAGNOSIS — Z7689 Persons encountering health services in other specified circumstances: Secondary | ICD-10-CM | POA: Diagnosis not present

## 2020-06-26 DIAGNOSIS — R519 Headache, unspecified: Secondary | ICD-10-CM | POA: Diagnosis not present

## 2020-06-26 DIAGNOSIS — R51 Headache with orthostatic component, not elsewhere classified: Secondary | ICD-10-CM | POA: Diagnosis not present

## 2020-06-26 DIAGNOSIS — G4733 Obstructive sleep apnea (adult) (pediatric): Secondary | ICD-10-CM | POA: Diagnosis not present

## 2020-06-26 DIAGNOSIS — R55 Syncope and collapse: Secondary | ICD-10-CM | POA: Diagnosis not present

## 2020-06-26 DIAGNOSIS — I480 Paroxysmal atrial fibrillation: Secondary | ICD-10-CM | POA: Diagnosis not present

## 2020-07-04 DIAGNOSIS — H938X3 Other specified disorders of ear, bilateral: Secondary | ICD-10-CM | POA: Diagnosis not present

## 2020-07-06 ENCOUNTER — Ambulatory Visit: Payer: Medicare HMO | Admitting: Cardiology

## 2020-07-06 ENCOUNTER — Other Ambulatory Visit: Payer: Self-pay

## 2020-07-06 ENCOUNTER — Encounter: Payer: Self-pay | Admitting: Cardiology

## 2020-07-06 VITALS — BP 124/72 | HR 61 | Ht 68.0 in | Wt 203.0 lb

## 2020-07-06 DIAGNOSIS — I480 Paroxysmal atrial fibrillation: Secondary | ICD-10-CM | POA: Diagnosis not present

## 2020-07-06 NOTE — Progress Notes (Addendum)
Electrophysiology Office Note   Date:  07/06/2020   ID:  Zachary Montoya, DOB 1945/06/28, MRN [id number]  PCP:  Crist Infante, MD  Cardiologist:  Irish Lack Primary Electrophysiologist:  Constance Haw, MD    No chief complaint on file.    History of Present Illness: Zachary Montoya is a 75 y.o. male who presents today for electrophysiology evaluation.     Zachary Montoya has a history significant for sleep apnea, paroxysmal atrial fibrillation, and diabetes.  Zachary Montoya is felt multiple episodes of atrial fibrillation and feels that this is been going on since the 1980s.  Zachary Montoya uses a CPAP.  Zachary Montoya is currently on flecainide.  Today, denies symptoms of palpitations, chest pain, shortness of breath, orthopnea, PND, lower extremity edema, claudication, dizziness, presyncope, syncope, bleeding, or neurologic sequela. The patient is tolerating medications without difficulties.  Since last being seen Zachary Montoya has done well.  Zachary Montoya has noted only 2 further episodes of atrial fibrillation.  On one episode, Zachary Montoya feels that there was not any issues and Zachary Montoya just went into atrial fibrillation.  On the other episode, Zachary Montoya may have missed a dose of flecainide.  Zachary Montoya is interested in alternative methods of anticoagulation.  Zachary Montoya says that Zachary Montoya works quite a bit and gets cuts and bruises quite often.  Zachary Montoya does not have a history of GI bleeding or intracranial hemorrhage.   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  3   B Complex-C (B-COMPLEX WITH VITAMIN C) tablet Take 1 tablet by mouth daily.     Calcium  Carbonate-Vitamin D (CALCIUM-D PO) Take 1 tablet by mouth daily. 600 mg calcium + 800 iu vit. d     cetirizine (ZYRTEC) 10 MG tablet Take by mouth as needed.     Cholecalciferol (VITAMIN D3) 2000 UNITS TABS Take 2,000 Units by mouth daily.     Coenzyme Q10 (COQ10 PO) Take 300 mg by mouth daily.     ferrous sulfate 325 (65 FE) MG tablet Take 325 mg by mouth daily.     flecainide (TAMBOCOR) 100 MG tablet Take 1 tablet (100 mg total) by mouth 2 (two) times daily. 180 tablet 3   metoprolol succinate (TOPROL-XL) 25 MG 24 hr tablet Take 1 tablet (25 mg total) by mouth daily. 30 tablet 11   Multiple Vitamins-Minerals (CENTRUM ADULTS PO) Take 1 tablet by mouth daily.     Omega-3 Fatty Acids (OMEGA 3 PO) Take 1,280 mg by mouth daily.     omeprazole (PRILOSEC) 20 MG capsule Take 20 mg by mouth daily.  3   No current facility-administered medications for this visit.    Allergies:   Timolol   Social History:  The patient  reports that Zachary Montoya has never smoked. Zachary Montoya has never used smokeless tobacco. Zachary Montoya reports that Zachary Montoya does not drink alcohol and does not use drugs.   Family History:  The patient's family history includes Cancer in his brother; Diabetes in his mother; Hypertension in his mother; Stroke in his mother.   ROS:  Please see the history of present illness.  Otherwise, review of systems is positive for none.   All other systems are reviewed and negative.   PHYSICAL EXAM: VS:  BP 124/72   Pulse 61   Ht 5\' 8"  (1.727 m)   Wt 203 lb (92.1 kg)   BMI 30.87 kg/m  , BMI Body mass index is 30.87 kg/m. GEN: Well nourished, well developed, in no acute distress  HEENT: normal  Neck: no JVD, carotid bruits, or masses Cardiac: RRR; no murmurs, rubs, or gallops,no edema  Respiratory:  clear to auscultation bilaterally, normal work of breathing GI: soft, nontender, nondistended, + BS MS: no deformity or atrophy  Skin: warm and dry Neuro:  Strength and sensation are intact Psych: euthymic mood, full  affect  EKG:  EKG is ordered today. Personal review of the ekg ordered shows sinus rhythm, rate 61  Recent Labs: 03/29/2020: BUN 18; Creatinine, Ser 0.99; Hemoglobin 15.5; Platelets 214; Potassium 4.0; Sodium 137    Lipid Panel  No results found for: CHOL, TRIG, HDL, CHOLHDL, VLDL, LDLCALC, LDLDIRECT   Wt Readings from Last 3 Encounters:  07/06/20 203 lb (92.1 kg)  04/28/20 194 lb (88 kg)  03/28/20 189 lb 3.2 oz (85.8 kg)      Other studies Reviewed: Additional studies/ records that were reviewed today include: TTE 01/03/15 - Left ventricle: The cavity size was normal. There was mild   concentric hypertrophy. Systolic function was normal. The   estimated ejection fraction was in the range of 60% to 65%. Wall   motion was normal; there were no regional wall motion   abnormalities. Left ventricular diastolic function parameters   were normal. - Aortic valve: There was trivial regurgitation. - Right ventricle: The cavity size was normal. Wall thickness was   normal. Systolic function was normal. - Tricuspid valve: There was trivial regurgitation. - Pulmonic valve: There was trivial regurgitation. - Inferior vena cava: The vessel was dilated. The respirophasic   diameter changes were blunted (< 50%), consistent with elevated   central venous pressure. - Global longitudinal strain -16.3%  Telemetry 06/28/15  NSR with Paroxysmal Atrial fibrillation. Atrial fibrillation 3% of the time.   ASSESSMENT AND PLAN:  1.  Paroxysmal atrial fibrillation: Currently on diltiazem and flecainide.  High risk medication monitoring.  QRS remains narrow on the current dose.  CHA2DS2-VASc of 1.  Zachary Montoya continues to wish to avoid anticoagulation as Zachary Montoya has had minimal episodes of atrial fibrillation.  Zachary Montoya is potentially interested in [place].  We Zachary Montoya discuss this with our watchman implant or is.  I have seen Zachary Montoya is a 75 y.o. male in the office today. The patient is felt to be a poor  candidate for long-term anticoagulation because of risk of injury at work.  Their CHADS-2-Vasc Score and unadjusted Ischemic Stroke Rate (% per year) is equal to 0.6 % stroke rate/year from a score of 1, necessitating a strategy of stroke prevention with either long-term oral anticoagulation or left atrial appendage occlusion therapy. We have discussed their bleeding risk in the context of their comorbid medical problems, as well as the rationale for referral for evaluation of Watchman left atrial appendage occlusion therapy. While the patient is at high long-term bleeding risk, they may be appropriate for short-term anticoagulation. Based on this individual patient's stroke and bleeding risk, a shared decision has been made to refer the patient for consideration of Watchman left atrial appendage closure utilizing the [employer] of Cardiology shared decision tool.   2.  Obstructive sleep  apnea: CPAP compliance encouraged  Current medicines are reviewed at length with the patient today.   The patient does not have concerns regarding his medicines.  The following changes were made today: None  Labs/ tests ordered today include:  Orders Placed This Encounter  Procedures   EKG 12-Lead     Disposition:   FU with Zachary Montoya 6 months  Signed, Stephan Draughn Meredith Leeds, MD  07/06/2020 9:03 AM     [facility] [address] [place] [address] [phone] (office) [phone] (fax)

## 2020-07-11 ENCOUNTER — Encounter: Payer: Self-pay | Admitting: Neurology

## 2020-07-11 ENCOUNTER — Ambulatory Visit: Payer: Medicare HMO | Admitting: Neurology

## 2020-07-11 VITALS — BP 116/68 | HR 50 | Ht 68.0 in | Wt 206.0 lb

## 2020-07-11 DIAGNOSIS — R519 Headache, unspecified: Secondary | ICD-10-CM | POA: Diagnosis not present

## 2020-07-11 DIAGNOSIS — Z9989 Dependence on other enabling machines and devices: Secondary | ICD-10-CM | POA: Diagnosis not present

## 2020-07-11 DIAGNOSIS — G4486 Cervicogenic headache: Secondary | ICD-10-CM

## 2020-07-11 DIAGNOSIS — G4733 Obstructive sleep apnea (adult) (pediatric): Secondary | ICD-10-CM | POA: Diagnosis not present

## 2020-07-11 DIAGNOSIS — G44209 Tension-type headache, unspecified, not intractable: Secondary | ICD-10-CM | POA: Diagnosis not present

## 2020-07-11 DIAGNOSIS — Z789 Other specified health status: Secondary | ICD-10-CM | POA: Diagnosis not present

## 2020-07-11 NOTE — Progress Notes (Signed)
Subjective:    Patient ID: Darel Ricketts is a 75 y.o. male.  HPI     Star Age, MD, PhD [facility] [address], [address] [place], [place] [address]  Dear Colletta [place],   I saw your patient, Ibraheem Voris, upon your kind request in my neurologic clinic today for initial consultation of his recurrent headaches.  The patient is unaccompanied today.  As you know, Mr. Dorothea Ogle is a 75 year old right-handed gentleman with an underlying medical history of reflux disease, paroxysmal A. fib, glaucoma, OSA, and borderline obesity, who reports recent onset of recurrent headaches since January 2022.  He reports that initially he had neck stiffness and pain in the neck and shoulder girdle, to the point where he had difficulty turning side to side with his neck.  He saw a chiropractor and it did not help to get neck manipulations, he then saw a massage therapist and his neck muscle tension and stiffness and neck pain improved.  His mobility improved.  His pain was achy, radiating forward to the left temple and now is more confined to the right temple and behind the right eye.  It is not a stabbing or severe pain, more on the dull and achy side, 4-5 out of a scale of 0-10.  He has no associated neurological accompaniments, no nausea or vomiting, some light sensitivity reported.  No prior history of migraines or family history of migraines but does believe his daughter has migraines.  He has not had any temporal tenderness or TMJ issues, does not grind his teeth.  Is using his sleep apnea machine faithfully all night every night by his description.  He reports no sudden onset of one-sided weakness or numbness or tingling or droopy face or slurring of speech.  Headache seems to be notable when he bends over and improves when he stands upright. I reviewed your office note from 07/19/2020.  He has been followed by cardiology.  He had recent blood work through your office on 06/26/2020, ESR  was 27, CRP was also done but result is not included in the paperwork.  We will request the results from your office.  He had recent several imaging studies.  CT head without contrast on 03/28/2020 was done for indication of syncope.  I was able to review the images through the PACS system and also reviewed the report: Impression: No acute or reversible finding.  He had a brain MRI without contrast on 03/28/2020 as well as MR a head without contrast and MRA neck with and without contrast and I reviewed the results: IMPRESSION: No acute infarction, hemorrhage, or mass.   No large vessel occlusion, hemodynamically significant stenosis, or evidence of dissection.   He has been on Eliquis since January 2022.  He has had episodes of syncope and collapse.  He was admitted to the hospital on 03/28/2020 through 03/29/2020 after a syncopal event.  He was noted to have associated bradycardia.  He was discontinued on diltiazem during the hospitalization and flecainide was increased.  He was discharged with a Holter monitor.  He was off his anticoagulation due to retinal bleeding for which he saw ophthalmology.  He is followed by pulmonology for his sleep apnea.  He was last seen by the nurse practitioner at the [facility] in [place] on 01/11/2020 and was noted to be compliant with his AutoPap machine of 7 to 20 cm with 70% compliance reported, average usage approximately 8 hours, AHI reduced to  approximately 1.1/h.  He was advised to return in 1 year.  We received his CRP results from 06/26/2020 and it was less than 1.  He is scheduled to see a rheumatologist.  He has seen his ophthalmologist and has an appointment pending for follow-up, he had retinal bleeding on the left side which improved.  He does not hydrate very well with water, reports drinking about 1 bottle of water per day and multiple cups of coffee, about 5 cups/day, switches to decaf after 6 PM.  He is a non-smoker and drinks  alcohol occasionally, once or twice a week.  He takes over-the-counter Tylenol and/or ibuprofen as needed.  He is currently without symptoms of headache.  The only new medication he started around January was Eliquis.  After he stopped it, the headache did not change.  His Past Medical History Is Significant For: Past Medical History:  Diagnosis Date  . 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 Elevated blood pressure reading without diagnosis of hypertension 06/07/2014  . Hyperlipidemia 06/07/2014  . PAF (paroxysmal atrial fibrillation) ([place])   . Sleep apnea     His Past Surgical History Is Significant For: Past Surgical History:  Procedure Laterality Date  . NO PAST SURGERIES      His Family History Is Significant For: Family History  Problem Relation Age of Onset  . Diabetes Mother   . Hypertension Mother   . Stroke Mother   . Cancer Brother   . Heart attack Neg Hx     His Social History Is Significant For: Social History   Socioeconomic History  . Marital status: Married    Spouse name: Not on file  . Number of children: Not on file  . Years of education: Not on file  . Highest education level: Not on file  Occupational History  . Not on file  Tobacco Use  . Smoking status: Never Smoker  . Smokeless tobacco: Never Used  Vaping Use  . Vaping Use: Never used  Substance and Sexual Activity  . Alcohol use: No    Alcohol/week: 0.0 standard drinks  . Drug use: No  . Sexual activity: Not on file  Other Topics Concern  . Not on file  Social History Narrative  . Not on file   Social Determinants of Health   Financial Resource Strain: Not on file  Food Insecurity: Not on file  Transportation Needs: Not on file  Physical Activity: Not on file  Stress: Not on file  Social Connections: Not on file    His Allergies Are:  Allergies  Allergen Reactions  . Timolol     Bradycardia/dizziness Other reaction(s): Dizziness, Dizziness  (intolerance) Bradycardia/dizziness   :   His Current Medications Are:  Outpatient Encounter Medications as of 07/11/2020  Medication Sig  . atorvastatin (LIPITOR) 20 MG tablet Take 20 mg by mouth daily.  . B Complex-C (B-COMPLEX WITH VITAMIN C) tablet Take 1 tablet by mouth daily.  . Calcium Carbonate-Vitamin D (CALCIUM-D PO) Take 1 tablet by mouth daily. 600 mg calcium + 800 iu vit. d  . cetirizine (ZYRTEC) 10 MG tablet Take by mouth as needed.  . Cholecalciferol (VITAMIN D3) 2000 UNITS TABS Take 2,000 Units by mouth daily.  . Coenzyme Q10 (COQ10 PO) Take 300 mg by mouth daily.  . ferrous sulfate 325 (65 FE) MG tablet Take 325 mg by mouth daily.  . flecainide (TAMBOCOR) 100 MG tablet Take 1 tablet (100 mg total) by mouth 2 (two) times daily.  Marland Kitchen  metoprolol succinate (TOPROL-XL) 25 MG 24 hr tablet Take 1 tablet (25 mg total) by mouth daily.  . Multiple Vitamins-Minerals (CENTRUM ADULTS PO) Take 1 tablet by mouth daily.  . Omega-3 Fatty Acids (OMEGA 3 PO) Take 1,280 mg by mouth daily.  Marland Kitchen omeprazole (PRILOSEC) 20 MG capsule Take 20 mg by mouth daily.  . vitamin C (ASCORBIC ACID) 500 MG tablet Take 500 mg by mouth daily.  Marland Kitchen VITAMIN D PO Take 2,000 Units by mouth.   No facility-administered encounter medications on file as of 07/11/2020.  :   Review of Systems:  Out of a complete 14 point review of systems, all are reviewed and negative with the exception of these symptoms as listed below:  Review of Systems  Neurological:       Pt here to discussed  headaches and head pressure when bending down, pain last for a min and stops when he remains still, pain is usually on right side. States these headaches started in January 2022 and was only present on left side.     Objective:  Neurological Exam  Physical Exam Physical Examination:   Vitals:   07/11/20 1038  BP: 116/68  Pulse: (!) 50    General Examination: The patient is a very pleasant 75 y.o. male in no acute distress. He  appears well-developed and well-nourished and well groomed.   HEENT: Normocephalic, atraumatic, pupils are equal, round and reactive to light and accommodation. Funduscopic exam is normal with sharp disc margins noted. Extraocular tracking is good without limitation to gaze excursion or nystagmus noted. Normal smooth pursuit is noted. Hearing is grossly intact, with bilateral hearing aids in place.  Corrective eyeglasses in place, mild bilateral cataracts noted.  Face is symmetric with normal facial animation and normal sensation to light touch, temperature and vibration.  He has no temporal tenderness, no palpable cord on either side, no TMJ tenderness.  Airway examination reveals mild mouth dryness, moderate airway crowding, tongue protrudes centrally and palate elevates symmetrically, no lip, neck or jaw tremor, neck is supple with full range of motion, no carotid bruits.    Chest: Clear to auscultation without wheezing, rhonchi or crackles noted.  Heart: S1+S2+0, regular and normal without murmurs, rubs or gallops noted.   Abdomen: Soft, non-tender and non-distended with normal bowel sounds appreciated on auscultation.  Extremities: There is trace pitting edema in the distal lower extremities bilaterally. Pedal pulses are intact.  Skin: Warm and dry without trophic changes noted.  Musculoskeletal: exam reveals no obvious joint deformities, tenderness.  Neurologically:  Mental status: The patient is awake, alert and oriented in all 4 spheres. His immediate and remote memory, attention, language skills and fund of knowledge are appropriate. There is no evidence of aphasia, agnosia, apraxia or anomia. Speech is clear with normal prosody and enunciation. Thought process is linear. Mood is normal and affect is normal.  Cranial nerves II - XII are as described above under HEENT exam. In addition: shoulder shrug is normal with equal shoulder height noted. Motor exam: Normal bulk, strength and tone is  noted. There is no drift, tremor or rebound. Romberg is negative. Reflexes are 2+ throughout. Babinski: Toes are flexor bilaterally. Fine motor skills and coordination: intact with normal finger taps, normal hand movements, normal rapid alternating patting, normal foot taps and normal foot agility.  Cerebellar testing: No dysmetria or intention tremor on finger to nose testing. Heel to shin is unremarkable bilaterally. There is no truncal or gait ataxia.  Sensory exam: intact to  light touch, vibration, temperature sense in the upper and lower extremities.  Gait, station and balance: He stands easily. No veering to one side is noted. No leaning to one side is noted. Posture is age-appropriate and stance is narrow based. Gait shows normal stride length and normal pace. No problems turning are noted. Tandem walk is unremarkable.      Assessment and Plan:   In summary, Brier Reid is a very pleasant 75 y.o.-year old male  with an underlying medical history of reflux disease, paroxysmal A. fib, glaucoma, OSA, and borderline obesity, who presents for evaluation of his new onset headaches of a few months duration, started in or around January 2022 with neck pain and neck stiffness.  This has improved with recent massage therapy, he had some left temporal headaches and now has right temporal headache or pressure, particularly with bending over.  Headache description is not specific for any sinister headache syndrome, in particular, headache constellation is not in keeping with cluster headache, trigeminal neuralgia, temporal arteritis, or migraine headaches.  He likely has a combination of headaches, contributors may include cervicogenic headaches, suboptimal hydration or dehydration, caffeine overuse, tension headache, positional headaches with blood pressure fluctuation, medication side effects, underlying sleep apnea.  We talked about this at length today, reassuringly, his neurological exam is nonfocal.  He  had recent imaging tests but I would like to make sure we exclude a structural cause of his symptoms by repeating a brain MRI with contrast.  He is agreeable.  He is reminded to use his AutoPap machine consistently.  He typically has a yearly checkup for his sleep apnea.  He has a follow-up appointment pending with his ophthalmologist and has been scheduled to see a rheumatologist next month.  I would like for him to work on lifestyle modification for now, I would like for him to increase his water intake to drink about 3-4 bottles of water per day and reduce his caffeine intake to limit himself to 1-2 servings per day for now.  We will call him with his MRI results and plan a follow-up afterwards.  He is largely reassured today, he is advised to follow-up with your office as scheduled as well.  I answered all his questions today and he was in agreement with plan. Thank you very much for allowing me to participate in the care of this nice patient. If I can be of any further assistance to you please do not hesitate to call me at [phone].  Sincerely,   Star Age, MD, PhD

## 2020-07-11 NOTE — Patient Instructions (Addendum)
Please remember, common headache triggers are: sleep deprivation, dehydration, overheating, stress, hypoglycemia or skipping meals and blood sugar fluctuations, excessive pain medications or excessive alcohol use or caffeine withdrawal. Some people have food triggers such as aged cheese, orange juice or chocolate, especially dark chocolate, or MSG (monosodium glutamate). Try to avoid these headache triggers as much possible. It may be helpful to keep a headache diary to figure out what makes your headaches worse or brings them on and what alleviates them. Some people report headache onset after exercise but studies have shown that regular exercise may actually prevent headaches from coming. If you have exercise-induced headaches, please make sure that you drink plenty of fluid before and after exercising and that you do not over do it and do not overheat.  We will do a brain MRI with and without contrast to rule out a structural cause of your headaches.  Your headache description is not in keeping with migraine headaches, likely a multifactorial headache, meaning, stemming from multiple issues including chronic neck pain, dehydration or suboptimal hydration, caffeine overuse, and blood pressure fluctuations as blood pressure changes when you bend over.    Please reduce your caffeine intake and limit yourself to 1 or 2 cups of coffee per day.  Please increase your water intake and try to drink about 3-4 bottles of water per day, 16.9 ounce size each.  Try to get 7 to 8 hours of sleep on an average night and continue to use your CPAP or AutoPap machine faithfully.  Keep your follow-up appointment with your ophthalmologist and your new evaluation with a rheumatologist.  Your neurological exam is benign thankfully.  We will plan a follow-up after your MRI scan.  We will keep you posted as to its result by phone call as well.

## 2020-07-12 ENCOUNTER — Telehealth: Payer: Self-pay | Admitting: Neurology

## 2020-07-12 NOTE — Telephone Encounter (Signed)
MR Brain w/wo contrast sent to [facility] 5/17. They will obtain auth if needed for [facility].

## 2020-07-13 ENCOUNTER — Ambulatory Visit: Payer: Medicare HMO | Admitting: Neurology

## 2020-07-13 DIAGNOSIS — H2513 Age-related nuclear cataract, bilateral: Secondary | ICD-10-CM | POA: Diagnosis not present

## 2020-07-13 DIAGNOSIS — H3562 Retinal hemorrhage, left eye: Secondary | ICD-10-CM | POA: Diagnosis not present

## 2020-07-13 DIAGNOSIS — H43393 Other vitreous opacities, bilateral: Secondary | ICD-10-CM | POA: Diagnosis not present

## 2020-07-25 ENCOUNTER — Ambulatory Visit
Admission: RE | Admit: 2020-07-25 | Discharge: 2020-07-25 | Disposition: A | Discharge status: Home / Self Care | Payer: Medicare HMO | Source: Ambulatory Visit | Attending: Neurology | Admitting: Neurology

## 2020-07-25 ENCOUNTER — Other Ambulatory Visit: Payer: Self-pay

## 2020-07-25 DIAGNOSIS — G4486 Cervicogenic headache: Secondary | ICD-10-CM

## 2020-07-25 DIAGNOSIS — R519 Headache, unspecified: Secondary | ICD-10-CM

## 2020-07-25 DIAGNOSIS — Z789 Other specified health status: Secondary | ICD-10-CM

## 2020-07-26 ENCOUNTER — Ambulatory Visit: Payer: Medicare HMO | Admitting: Physician Assistant

## 2020-07-26 ENCOUNTER — Encounter: Payer: Self-pay | Admitting: Physician Assistant

## 2020-07-26 DIAGNOSIS — Z85828 Personal history of other malignant neoplasm of skin: Secondary | ICD-10-CM | POA: Diagnosis not present

## 2020-07-26 DIAGNOSIS — L300 Nummular dermatitis: Secondary | ICD-10-CM | POA: Diagnosis not present

## 2020-07-26 DIAGNOSIS — Z1283 Encounter for screening for malignant neoplasm of skin: Secondary | ICD-10-CM

## 2020-07-26 DIAGNOSIS — Z86018 Personal history of other benign neoplasm: Secondary | ICD-10-CM

## 2020-07-26 DIAGNOSIS — D044 Carcinoma in situ of skin of scalp and neck: Secondary | ICD-10-CM

## 2020-07-26 DIAGNOSIS — C4492 Squamous cell carcinoma of skin, unspecified: Secondary | ICD-10-CM

## 2020-07-26 DIAGNOSIS — C44212 Basal cell carcinoma of skin of right ear and external auricular canal: Secondary | ICD-10-CM

## 2020-07-26 DIAGNOSIS — D485 Neoplasm of uncertain behavior of skin: Secondary | ICD-10-CM

## 2020-07-26 DIAGNOSIS — L570 Actinic keratosis: Secondary | ICD-10-CM

## 2020-07-26 DIAGNOSIS — C4491 Basal cell carcinoma of skin, unspecified: Secondary | ICD-10-CM

## 2020-07-26 HISTORY — DX: Squamous cell carcinoma of skin, unspecified: C44.92

## 2020-07-26 HISTORY — DX: Basal cell carcinoma of skin, unspecified: C44.91

## 2020-07-26 MED ORDER — TRIAMCINOLONE ACETONIDE 0.1 % EX CREA: 1.0000 "application " | TOPICAL_CREAM | Freq: Two times a day (BID) | CUTANEOUS | 3 refills | Status: DC | PRN

## 2020-07-26 NOTE — Progress Notes (Signed)
   Follow-Up Visit   Subjective  Zachary Montoya is a 75 y.o. male who presents for the following: Follow-up (Right ear x 1 week new bleeding/History moderate atypia and scc, bcc, ka per paper chart) and Annual Exam (yearly).   The following portions of the chart were reviewed this encounter and updated as appropriate:  Tobacco  Allergies  Meds  Problems  Med Hx  Surg Hx  Fam Hx      Objective  Well appearing patient in no apparent distress; mood and affect are within normal limits.  A full examination was performed including scalp, head, eyes, ears, nose, lips, neck, chest, axillae, abdomen, back, buttocks, bilateral upper extremities, bilateral lower extremities, hands, feet, fingers, toes, fingernails, and toenails. All findings within normal limits unless otherwise noted below.  Objective  Right Scaphoid Fossa: Ulcerated bleeding lesion     Objective  Left Posterior Neck: Hyperkeratotic scale with pink base      Objective  Left Frontal Scalp, Left Occipital Scalp, Left Temporal Scalp (4), Mid Occipital Scalp, Mid Parietal Scalp (2), Right Parotid Area, Right Posterior Mandible, Right Temporal Scalp: Erythematous patches with gritty scale.  Objective  Right Breast: Thin scaly erythematous plaques.   Assessment & Plan  Neoplasm of uncertain behavior of skin (2) Right Scaphoid Fossa  Skin / nail biopsy Type of biopsy: tangential   Informed consent: discussed and consent obtained   Timeout: patient name, date of birth, surgical site, and procedure verified   Procedure prep:  Patient was prepped and draped in usual sterile fashion (Non sterile) Prep type:  Chlorhexidine Anesthesia: the lesion was anesthetized in a standard fashion   Anesthetic:  1% lidocaine w/ epinephrine 1-100,000 local infiltration Instrument used: flexible razor blade   Outcome: patient tolerated procedure well   Post-procedure details: wound care instructions given    Specimen 1 -  Surgical pathology Differential Diagnosis: bcc vs scc  Check Margins: No  Left Posterior Neck  Skin / nail biopsy Type of biopsy: tangential   Informed consent: discussed and consent obtained   Timeout: patient name, date of birth, surgical site, and procedure verified   Procedure prep:  Patient was prepped and draped in usual sterile fashion (Non sterile) Prep type:  Chlorhexidine Anesthesia: the lesion was anesthetized in a standard fashion   Anesthetic:  1% lidocaine w/ epinephrine 1-100,000 local infiltration Instrument used: flexible razor blade   Outcome: patient tolerated procedure well   Post-procedure details: wound care instructions given    Specimen 2 - Surgical pathology Differential Diagnosis: bcc vs scc  Check Margins: No  AK (actinic keratosis) (12) Left Frontal Scalp; Left Occipital Scalp; Mid Parietal Scalp (2); Mid Occipital Scalp; Right Parotid Area; Right Posterior Mandible; Left Temporal Scalp (4); Right Temporal Scalp  Destruction of lesion - Left Frontal Scalp, Left Occipital Scalp, Left Temporal Scalp (2), Mid Occipital Scalp, Mid Parietal Scalp (2), Right Parotid Area, Right Posterior Mandible, Right Temporal Scalp Complexity: simple   Destruction method: cryotherapy   Informed consent: discussed and consent obtained   Timeout:  patient name, date of birth, surgical site, and procedure verified Lesion destroyed using liquid nitrogen: Yes   Cryotherapy cycles:  3 Outcome: patient tolerated procedure well with no complications    Nummular eczema Right Breast  triamcinolone cream (KENALOG) 0.1 % - Right Breast    I, Ilaria Much, PA-C, have reviewed all documentation's for this visit.  The documentation on 07/26/20 for the exam, diagnosis, procedures and orders are all accurate and complete.

## 2020-07-26 NOTE — Patient Instructions (Signed)

## 2020-07-27 MED ORDER — FLECAINIDE ACETATE 100 MG PO TABS: 100.0000 mg | ORAL_TABLET | Freq: Two times a day (BID) | ORAL | 2 refills | Status: DC

## 2020-07-27 MED ORDER — METOPROLOL SUCCINATE ER 25 MG PO TB24: 25.0000 mg | ORAL_TABLET | Freq: Every day | ORAL | 2 refills | Status: DC

## 2020-08-07 ENCOUNTER — Telehealth: Payer: Self-pay

## 2020-08-07 NOTE — Telephone Encounter (Signed)
-----   Message from Warren Danes, [place] sent at 08/02/2020  8:04 AM EDT ----- 30

## 2020-08-07 NOTE — Telephone Encounter (Signed)
Phone call to patient with his pathology results. Patient aware of results.  

## 2020-08-09 DIAGNOSIS — Z6830 Body mass index (BMI) 30.0-30.9, adult: Secondary | ICD-10-CM | POA: Diagnosis not present

## 2020-08-09 DIAGNOSIS — R519 Headache, unspecified: Secondary | ICD-10-CM | POA: Diagnosis not present

## 2020-08-09 DIAGNOSIS — E669 Obesity, unspecified: Secondary | ICD-10-CM | POA: Diagnosis not present

## 2020-08-17 DIAGNOSIS — H401113 Primary open-angle glaucoma, right eye, severe stage: Secondary | ICD-10-CM | POA: Diagnosis not present

## 2020-08-17 DIAGNOSIS — H401123 Primary open-angle glaucoma, left eye, severe stage: Secondary | ICD-10-CM | POA: Diagnosis not present

## 2020-08-18 ENCOUNTER — Encounter: Payer: Self-pay | Admitting: Cardiology

## 2020-08-18 ENCOUNTER — Ambulatory Visit: Payer: Medicare HMO | Admitting: Cardiology

## 2020-08-18 ENCOUNTER — Other Ambulatory Visit: Payer: Self-pay

## 2020-08-18 VITALS — BP 130/70 | HR 59 | Ht 68.0 in | Wt 202.0 lb

## 2020-08-18 DIAGNOSIS — I10 Essential (primary) hypertension: Secondary | ICD-10-CM

## 2020-08-18 DIAGNOSIS — I480 Paroxysmal atrial fibrillation: Secondary | ICD-10-CM | POA: Diagnosis not present

## 2020-08-18 MED ORDER — APIXABAN 5 MG PO TABS: 5.0000 mg | ORAL_TABLET | Freq: Two times a day (BID) | ORAL | 11 refills | Status: DC

## 2020-08-18 NOTE — Progress Notes (Signed)
Electrophysiology Office Follow up Visit Note:    Date:  08/18/2020   ID:  Zachary Montoya, DOB [birth year], MRN [id number]  PCP:  Crist Infante, MD  [facility] Cardiologist:  None  [facility] Electrophysiologist:  Will Meredith Leeds, MD    Interval History:    Zachary Montoya is a 75 y.o. male who presents for a visit to discuss watchman implant at the request of Dr. Curt Bears.  The patient has a diagnosis of paroxysmal atrial fibrillation.  The patient was last seen by Dr. Curt Bears on Jul 06, 2020.  At that appointment with Dr. Curt Bears the patient expressed an interest in avoiding long-term exposure anticoagulation given frequent injuries with work.  Patient confirms the above with me today.  He tells me that in the past when he took a blood thinner he had terrible headaches and discontinued them.  He also is very active and frequently gets caught and has bruising and is concerned about the long-term risks of being on a blood thinner and the risk for serious bleeding.  He has done an extensive amount of research on the watchman device.  He actually brought with him the [facility] trial printed today to clinic.   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   atorvastatin (LIPITOR) 20 MG tablet Take 20 mg by mouth daily.   B Complex-C (B-COMPLEX WITH VITAMIN C) tablet Take 1 tablet  by mouth daily.   Calcium Carbonate-Vitamin D (CALCIUM-D PO) Take 1 tablet by mouth daily. 600 mg calcium + 800 iu vit. d   cetirizine (ZYRTEC) 10 MG tablet Take by mouth as needed.   Cholecalciferol (VITAMIN D3) 2000 UNITS TABS Take 2,000 Units by mouth daily.   Coenzyme Q10 (COQ10 PO) Take 300 mg by mouth daily.   ferrous sulfate 325 (65 FE) MG tablet Take 325 mg by mouth daily.   flecainide (TAMBOCOR) 100 MG tablet Take 1 tablet (100 mg total) by mouth 2 (two) times daily.   metoprolol succinate (TOPROL-XL) 25 MG 24 hr tablet Take 1 tablet (25 mg total) by mouth daily.   Multiple Vitamins-Minerals (CENTRUM ADULTS PO) Take 1 tablet by mouth daily.   Omega-3 Fatty Acids (OMEGA 3 PO) Take 1,280 mg by mouth daily.   omeprazole (PRILOSEC) 20 MG capsule Take 20 mg by mouth daily.   triamcinolone cream (KENALOG) 0.1 % Apply 1 application topically 2 (two) times daily as needed.   VITAMIN D PO Take 2,000 Units by mouth.     Allergies:   Timolol, Bee venom, and Brimonidine tartrate   Social History   Socioeconomic History   Marital status: Married    Spouse name: Not on file   Number of children: Not on file   Years of education: Not on file   Highest education level: Not on file  Occupational History   Not on file  Tobacco Use   Smoking status: Never   Smokeless tobacco: Never  Vaping Use   Vaping Use: Never used  Substance and Sexual Activity   Alcohol use: No    Alcohol/week: 0.0 standard drinks   Drug use: No   Sexual activity: Not on file  Other Topics Concern   Not on file  Social History Narrative   Not on file   Social Determinants of Health   Financial Resource Strain: Not on file  Food Insecurity: Not on file  Transportation Needs: Not on file  Physical Activity: Not on file  Stress: Not on file  Social Connections: Not on file     Family History: The patient's family history includes Cancer in his brother; Diabetes in his mother; Hypertension in his mother;  Stroke in his mother. There is no history of Heart attack.  ROS:   Please see the history of present illness.    All other systems reviewed and are negative.  EKGs/Labs/Other Studies Reviewed:    The following studies were reviewed today:   April 19, 2020 Zio patch Max 124 bpm 01:02pm, 02/04 Min 40 bpm 01:57am, 02/11 Avg 55 bpm <1% ventricular and supraventricular ectopy Predominant rhythm was sinus rhythm One run of SVT, 18 beats at 119 BPM No other arrhthymias noted No symptoms reported   March 29, 2020 echo personally reviewed Left ventricular function normal, 65% Right ventricular function normal No significant valvular abnormalities Normal left atrial size    Recent Labs: 03/29/2020: BUN 18; Creatinine, Ser 0.99; Hemoglobin 15.5; Platelets 214; Potassium 4.0; Sodium 137  Recent Lipid Panel No results found for: CHOL, TRIG, HDL, CHOLHDL, VLDL, LDLCALC, LDLDIRECT  Physical Exam:    VS:  BP 130/70   Pulse (!) 59   Ht 5\' 8"  (1.727 m)   Wt 202 lb (91.6 kg)   SpO2 99%   BMI 30.71 kg/m     Wt Readings from Last 3 Encounters:  08/18/20 202 lb (91.6 kg)  07/11/20 206 lb (93.4 kg)  07/06/20 203 lb (92.1 kg)     GEN:  Well nourished, well developed in no acute distress HEENT: Normal NECK: No JVD; No carotid bruits LYMPHATICS: No lymphadenopathy CARDIAC: RRR, no murmurs, rubs, gallops RESPIRATORY:  Clear to auscultation without rales, wheezing or rhonchi  ABDOMEN: Soft, non-tender, non-distended MUSCULOSKELETAL:  No edema; No deformity  SKIN: Warm and dry NEUROLOGIC:  Alert and oriented x 3 PSYCHIATRIC:  Normal affect   ASSESSMENT:    1. Paroxysmal atrial fibrillation (HCC)   2. Essential hypertension    PLAN:    In order of problems listed above:   1. Paroxysmal atrial fibrillation (HCC) The patient has paroxysmal atrial fibrillation with an overall low burden of atrial fibrillation well maintained on flecainide.  He is not currently taking an  anticoagulant because of fear of injuring himself at work.  He presents today to discuss an alternative stroke prophylaxis strategy using the watchman device.  I have seen Zachary Montoya in the office today who is being considered for a Watchman left atrial appendage closure device. I believe they will benefit from this procedure given their history of atrial fibrillation, CHA2DS2-VASc score of 2 (3 in 2 months) and unadjusted ischemic stroke rate of 2.9% (3.2% in 2 months) per year. Unfortunately, the patient is not felt to be a long term anticoagulation candidate secondary to risk of personal injury with work and patient's strong desire to avoid exposure to anticoagulation. The patient's chart has been reviewed and  I feel that they would be a candidate for short term oral anticoagulation after Watchman implant.   It is my belief that after undergoing a LAA closure procedure, Zachary Montoya will not need long term anticoagulation which eliminates anticoagulation side effects and major bleeding risk.   Procedural risks for the Watchman implant have been reviewed with the patient including a 0.5% risk of stroke, <1% risk of perforation and <1% risk of device embolization.   The published clinical data on the safety and effectiveness of WATCHMAN include but are not limited to the following: - Holmes DR, Mechele Claude, Sick P et al. for the PROTECT AF Investigators. Percutaneous closure of the left atrial appendage versus warfarin therapy for prevention of stroke in patients with atrial fibrillation: a randomised non-inferiority trial. Lancet 2009; 374: 534-42. Mechele Claude, Doshi SK, Abelardo Diesel D et al. on behalf of the PROTECT AF Investigators. Percutaneous Left Atrial Appendage Closure for Stroke Prophylaxis in Patients With Atrial Fibrillation 2.3-Year Follow-up of the PROTECT AF (Watchman Left Atrial Appendage System for Embolic Protection in Patients With Atrial Fibrillation) Trial. Circulation  2013; 127:720-729. - Alli O, Doshi S,  Kar S, Reddy VY, Sievert H et al. Quality of Life Assessment in the Randomized PROTECT AF (Percutaneous Closure of the Left Atrial Appendage Versus Warfarin Therapy for Prevention of Stroke in Patients With Atrial Fibrillation) Trial of Patients at Risk for Stroke With Nonvalvular Atrial Fibrillation. J Am Coll Cardiol 2013; 16:0737-1. Vertell Limber DR, Tarri Abernethy, Price M, Bagley, Sievert H, Doshi S, Huber K, Reddy V. Prospective randomized evaluation of the Watchman left atrial appendage Device in patients with atrial fibrillation versus long-term warfarin therapy; the PREVAIL trial. Journal of the [employer] of Cardiology, Vol. 4, No. 1, 2014, 1-11. - Kar S, Doshi SK, Sadhu A, Horton R, Osorio J et al. Primary outcome evaluation of a next-generation left atrial appendage closure device: results from the PINNACLE FLX trial. Circulation 2021;143(18)1754-1762.    After today's visit with the patient which was dedicated solely for shared decision making visit regarding LAA closure device, the patient decided to proceed with the LAA appendage closure procedure scheduled to be done in the near future at [facility]. Prior to the procedure, I would like to obtain a gated CT scan of the chest with contrast timed for PV/LA visualization.   Mr. Hutmacher we will go ahead and start Eliquis 5 mg twice daily in anticipation of upcoming watchman implant.  He will continue this through the periprocedural period and at least 45 days after the implant.  We will also add aspirin 81 mg by mouth at the time of implant.  If the device is well-seated without a leak at the 45-day mark after implant, we will plan to add Plavix and discontinue Eliquis.   2. Essential hypertension Controlled.  Continue current medication regimen.   Total time spent with patient today 45 minutes. This includes reviewing records, evaluating the patient and coordinating care.   Medication  Adjustments/Labs and Tests Ordered: Current medicines are reviewed at length with the patient today.  Concerns regarding medicines are outlined above.  Orders Placed This Encounter  Procedures   CT CARDIAC MORPH/PULM VEIN W/CM&W/O CA SCORE   Basic Metabolic Panel (BMET)    Meds ordered this encounter  Medications   apixaban (ELIQUIS) 5 MG TABS tablet    Sig: Take 1 tablet (5 mg total) by mouth 2 (two) times daily.    Dispense:  60 tablet  Refill:  27      Signed, Lars Mage, MD, [facility], [facility] 08/18/2020 12:48 PM    Electrophysiology  [facility]

## 2020-08-18 NOTE — Patient Instructions (Addendum)
Medication Instructions:  Your physician has recommended you make the following change in your medication:     START taking Eliquis 5 mg- Take one tablet by mouth twice a day    *If you need a refill on your cardiac medications before your next appointment, please call your pharmacy*   Lab Work: You will get lab work today:  BMP   If you have labs (blood work) drawn today and your tests are completely normal, you will receive your results only by: [place] (if you have MyChart) OR A paper copy in the mail If you have any lab test that is abnormal or we need to change your treatment, we will call you to review the results.   Testing/Procedures:   Your physician has requested that you have cardiac CT. Cardiac computed tomography (CT) is a painless test that uses an x-ray machine to take clear, detailed pictures of your heart.    You will be scheduled for a cardiac CT    Follow-Up:   After your tests are complete you will be contacted by Ambrose Pancoast, nurse navigator, to establish a date for your procedure.   He will also schedule a pre procedure appointment with the afib clinic prior to your surgical date.   Please contact Ambrose Pancoast with any questions regarding this procedure at [phone].      Your next appointment:     AFIB CLINIC INFORMATION: Your appointment is scheduled on: _____________ at _________. Please arrive 15 minutes early for check-in. The AFib Clinic is located in the Heart and Vascular Specialty Clinics at [facility]. Parking instructions/directions: [address] (off [address]). When you pull in to Entrance C, there is an underground parking garage to your right. The code to enter the garage is ________________. Take the elevators to the first floor. Follow the signs to the Heart and Vascular Specialty Clinics. You will see registration at the end of the hallway. Phone number: [phone]

## 2020-08-19 LAB — BASIC METABOLIC PANEL
BUN/Creatinine Ratio: 13 (ref 10–24)
BUN: 13 mg/dL (ref 8–27)
CO2: 25 mmol/L (ref 20–29)
Calcium: 9.6 mg/dL (ref 8.6–10.2)
Chloride: 104 mmol/L (ref 96–106)
Creatinine, Ser: 1 mg/dL (ref 0.76–1.27)
Glucose: 100 mg/dL — ABNORMAL HIGH (ref 65–99)
Potassium: 4.5 mmol/L (ref 3.5–5.2)
Sodium: 143 mmol/L (ref 134–144)
eGFR: 79 mL/min/{1.73_m2} (ref >59)

## 2020-08-30 ENCOUNTER — Telehealth (HOSPITAL_COMMUNITY): Payer: Self-pay | Admitting: *Deleted

## 2020-08-30 NOTE — Telephone Encounter (Signed)
Attempted to call patient regarding upcoming cardiac CT appointment. °Left message on voicemail with name and callback number ° °Canary Fister RN Navigator Cardiac Imaging °[facility] °[phone] Office °[phone] Cell ° °

## 2020-08-30 NOTE — Telephone Encounter (Signed)
Reaching out to patient to offer assistance regarding upcoming cardiac imaging study; pt verbalizes understanding of appt date/time, parking situation and where to check in, pre-test NPO status  and verified current allergies; name and call back number provided for further questions should they arise ? ?Raylin Winer RN Navigator Cardiac Imaging ?[facility] ?[phone] office ?[phone] cell ? ?

## 2020-08-31 ENCOUNTER — Ambulatory Visit (HOSPITAL_COMMUNITY)
Admission: RE | Admit: 2020-08-31 | Discharge: 2020-08-31 | Disposition: A | Discharge status: Home / Self Care | Payer: Medicare HMO | Source: Ambulatory Visit | Attending: Cardiology | Admitting: Cardiology

## 2020-08-31 ENCOUNTER — Other Ambulatory Visit: Payer: Self-pay

## 2020-08-31 DIAGNOSIS — I10 Essential (primary) hypertension: Secondary | ICD-10-CM | POA: Diagnosis not present

## 2020-08-31 DIAGNOSIS — I480 Paroxysmal atrial fibrillation: Secondary | ICD-10-CM

## 2020-08-31 MED ORDER — NITROGLYCERIN 0.4 MG SL SUBL: 0.8000 mg | SUBLINGUAL_TABLET | Freq: Once | SUBLINGUAL | Status: DC

## 2020-08-31 MED ORDER — IOHEXOL 350 MG/ML SOLN
100.0000 mL | Freq: Once | INTRAVENOUS | Status: AC | PRN
  Administered 2020-08-31: 100 mL via INTRAVENOUS

## 2020-09-04 ENCOUNTER — Telehealth: Payer: Self-pay | Admitting: Nurse Practitioner

## 2020-09-04 NOTE — Telephone Encounter (Signed)
Call placed this afternoon to Zachary Montoya regarding his interest in having the Watchman procedure completed on 09/14/20. He requested more information regarding the Watchman procedure versus the AF Ablation. I advised him that I would have Dr. Quentin Ore contact him to discuss his options regarding next step in his treatment plan.   Ambrose Pancoast, RN Watchman Team

## 2020-09-05 ENCOUNTER — Telehealth (HOSPITAL_COMMUNITY): Payer: Self-pay | Admitting: Nurse Practitioner

## 2020-09-05 DIAGNOSIS — I480 Paroxysmal atrial fibrillation: Secondary | ICD-10-CM

## 2020-09-05 NOTE — Addendum Note (Signed)
Addended by: Harland German A on: 09/05/2020 01:14 PM   Modules accepted: Orders

## 2020-09-05 NOTE — Telephone Encounter (Signed)
Patient contacted this morning after discussing Watchman procedure versus AF ablation with Dr. Quentin Ore yesterday. He wishes to proceed at this time with Watchman implant and has started Eliquis  5 mg BID per Dr. Quentin Ore on 08/18/20. He is scheduled for 7:30 am om 7/21 for implant. We discussed next steps in the work up process and I advised that instruction will be sent to his My Chart account with more details later this afternoon. Patient agrees with plan and advised to call with any question regarding procedure.  Ambrose Pancoast, RN Watchman Team

## 2020-09-06 NOTE — Telephone Encounter (Signed)
Spoke with the patient in detail. Received copy of positive Covid test that will be uploaded into Epic.  The patient has decided not to proceed with Watchman on 10/05/20. Per his request, he has been moved to 10/26/20 for procedure.  Scheduled him for visit with Ricky in the [facility] 8/12 to update H&P, obtain EKG, labs, and CXR.   The patient was grateful for call and agrees with plan.

## 2020-09-11 NOTE — Telephone Encounter (Signed)
  Positive Covid test uploaded to Epic.

## 2020-09-12 ENCOUNTER — Other Ambulatory Visit (HOSPITAL_COMMUNITY): Payer: Medicare HMO

## 2020-09-12 ENCOUNTER — Other Ambulatory Visit: Payer: Medicare HMO

## 2020-09-25 ENCOUNTER — Ambulatory Visit (HOSPITAL_COMMUNITY): Payer: Medicare HMO | Admitting: Physician Assistant

## 2020-09-28 DIAGNOSIS — H401123 Primary open-angle glaucoma, left eye, severe stage: Secondary | ICD-10-CM | POA: Diagnosis not present

## 2020-10-06 ENCOUNTER — Other Ambulatory Visit: Payer: Self-pay

## 2020-10-06 ENCOUNTER — Ambulatory Visit (HOSPITAL_COMMUNITY)
Admission: RE | Admit: 2020-10-06 | Discharge: 2020-10-06 | Disposition: A | Discharge status: Home / Self Care | Payer: Medicare HMO | Source: Ambulatory Visit | Attending: Physician Assistant | Admitting: Physician Assistant

## 2020-10-06 ENCOUNTER — Encounter (HOSPITAL_COMMUNITY): Payer: Self-pay | Admitting: Physician Assistant

## 2020-10-06 DIAGNOSIS — D6869 Other thrombophilia: Secondary | ICD-10-CM | POA: Insufficient documentation

## 2020-10-06 DIAGNOSIS — Z6830 Body mass index (BMI) 30.0-30.9, adult: Secondary | ICD-10-CM | POA: Insufficient documentation

## 2020-10-06 DIAGNOSIS — E669 Obesity, unspecified: Secondary | ICD-10-CM | POA: Insufficient documentation

## 2020-10-06 DIAGNOSIS — Z7901 Long term (current) use of anticoagulants: Secondary | ICD-10-CM | POA: Insufficient documentation

## 2020-10-06 DIAGNOSIS — I10 Essential (primary) hypertension: Secondary | ICD-10-CM | POA: Diagnosis not present

## 2020-10-06 DIAGNOSIS — E785 Hyperlipidemia, unspecified: Secondary | ICD-10-CM | POA: Insufficient documentation

## 2020-10-06 DIAGNOSIS — G4733 Obstructive sleep apnea (adult) (pediatric): Secondary | ICD-10-CM | POA: Insufficient documentation

## 2020-10-06 DIAGNOSIS — I4891 Unspecified atrial fibrillation: Secondary | ICD-10-CM

## 2020-10-06 DIAGNOSIS — Z79899 Other long term (current) drug therapy: Secondary | ICD-10-CM | POA: Diagnosis not present

## 2020-10-06 DIAGNOSIS — I480 Paroxysmal atrial fibrillation: Secondary | ICD-10-CM | POA: Diagnosis not present

## 2020-10-06 LAB — BASIC METABOLIC PANEL
Anion gap: 8 (ref 5–15)
BUN: 11 mg/dL (ref 8–23)
CO2: 24 mmol/L (ref 22–32)
Calcium: 9.3 mg/dL (ref 8.9–10.3)
Chloride: 106 mmol/L (ref 98–111)
Creatinine, Ser: 0.9 mg/dL (ref 0.61–1.24)
GFR, Estimated: >60 mL/min (ref >60)
Glucose, Bld: 121 mg/dL — ABNORMAL HIGH (ref 70–99)
Potassium: 4.2 mmol/L (ref 3.5–5.1)
Sodium: 138 mmol/L (ref 135–145)

## 2020-10-06 LAB — CBC
HCT: 46.3 % (ref 39.0–52.0)
Hemoglobin: 14.9 g/dL (ref 13.0–17.0)
MCH: 31.6 pg (ref 26.0–34.0)
MCHC: 32.2 g/dL (ref 30.0–36.0)
MCV: 98.3 fL (ref 80.0–100.0)
Platelets: 182 10*3/uL (ref 150–400)
RBC: 4.71 MIL/uL (ref 4.22–5.81)
RDW: 12.4 % (ref 11.5–15.5)
WBC: 5.8 10*3/uL (ref 4.0–10.5)
nRBC: 0 % (ref 0.0–0.2)

## 2020-10-06 NOTE — Patient Instructions (Addendum)
LEFT ATRIAL APPENDAGE CLOSURE INSTRUCTIONS:    Start aspirin '81mg'$  once a day on September 1st  You are scheduled for a Left Atrial Appendage Device Implantation on Thursday, September 1st  1. Please arrive at the [facility] (Main Entrance A) at [facility]: [address] [place],  [address] at 530AM   (This time is two hours before your procedure to ensure your preparation). Free valet parking service is available. You are allowed ONE visitor in the waiting room during your procedure. Both you and your guest must wear masks. Special note: Every effort is made to have your procedure done on time. Please understand that emergencies sometimes delay scheduled procedures.  2.   Do not eat or drink after midnight prior to your procedure.     3.   Medication Instructions: Take ONLY the listed medications morning of procedure with a sip of water:  flecainide, aspirin '81mg'$      4. Plan for one night stay--bring personal belongings. When you are discharged, you will need someone to drive you home and stay with you for 24 hours.  5. Bring a current list of your medications and current insurance cards.  6. Please wear clothes that are easy to get on and off and wear slip-on shoes.  7. Ambrose Pancoast, the [facility], will arrange follow-up for you during your hospital stay and you will be discharged with your appointment dates/times. His direct number is [phone] if you need assistance.  **If you have any questions, do not hesitate to call the office! You will also be contacted the week of your procedure to confirm instructions.**

## 2020-10-06 NOTE — Progress Notes (Signed)
Primary Care Physician: Crist Infante, MD Primary Electrophysiologist: Dr Curt Bears  Referring Physician: Dr Doren Custard Zachary Montoya is a 75 y.o. male with a history of HLD, OSA, HTN, and atrial fibrillation who presents for follow up in the [facility]. Patient is on Eliquis for a CHADS2VASC score of 2, soon to be 3. Patient was seen by Dr Quentin Ore for [facility] device consideration. He is not felt to be a candidate for long term anticoagulation due to high injury risk at work. He is scheduled for Watchman implant on 10/26/20. He is currently tolerating anticoagulation without bleeding issues.  Today, he denies symptoms of palpitations, chest pain, shortness of breath, orthopnea, PND, lower extremity edema, dizziness, presyncope, syncope, snoring, daytime somnolence, bleeding, or neurologic sequela. The patient is tolerating medications without difficulties and is otherwise without complaint today.    Atrial Fibrillation Risk Factors:  he does have symptoms or diagnosis of sleep apnea. he is compliant with CPAP therapy. he does not have a history of rheumatic fever.   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     apixaban (ELIQUIS) 5 MG TABS tablet Take 1 tablet (5 mg total) by mouth 2 (two) times daily. 60 tablet 11   atorvastatin (LIPITOR) 20 MG tablet Take 20 mg by mouth daily.  3   B Complex-C (B-COMPLEX WITH VITAMIN C) tablet Take 1 tablet by mouth daily.     Calcium Carbonate-Vitamin D (CALCIUM-D PO) Take 1 tablet by mouth daily. 600 mg calcium + 800 iu vit. d     cetirizine (ZYRTEC) 10 MG tablet Take by mouth as needed.     Cholecalciferol (VITAMIN D3) 2000 UNITS TABS Take 2,000 Units by mouth daily.     Coenzyme Q10 (COQ10 PO) Take 300 mg by mouth daily.     ferrous sulfate 325 (65 FE) MG tablet Take 325 mg by mouth daily.     flecainide (TAMBOCOR) 100 MG tablet Take 1 tablet (100 mg total) by mouth 2 (two) times daily. 180 tablet 2   metoprolol succinate (TOPROL-XL) 25 MG 24 hr tablet Take 1 tablet (25 mg total) by mouth daily. 90 tablet 2   Multiple Vitamins-Minerals (CENTRUM ADULTS PO) Take 1 tablet by mouth daily.     Omega-3 Fatty Acids (OMEGA 3 PO) Take 1,280 mg by mouth daily.     omeprazole (  PRILOSEC) 20 MG capsule Take 20 mg by mouth daily.  3   No current facility-administered medications for this encounter.    Allergies  Allergen Reactions   Timolol     Bradycardia/dizziness Other reaction(s): Dizziness, Dizziness (intolerance) Bradycardia/dizziness    Bee Venom    Brimonidine Tartrate Other (See Comments)    Social History   Socioeconomic History   Marital status: Married    Spouse name: Not on file   Number of children: Not on file   Years of education: Not on file   Highest education level: Not on file  Occupational History   Not on  file  Tobacco Use   Smoking status: Never   Smokeless tobacco: Never  Vaping Use   Vaping Use: Never used  Substance and Sexual Activity   Alcohol use: Yes    Alcohol/week: 1.0 standard drink    Types: 1 Cans of beer per week   Drug use: No   Sexual activity: Not on file  Other Topics Concern   Not on file  Social History Narrative   Not on file   Social Determinants of Health   Financial Resource Strain: Not on file  Food Insecurity: Not on file  Transportation Needs: Not on file  Physical Activity: Not on file  Stress: Not on file  Social Connections: Not on file  Intimate Partner Violence: Not on file     ROS- All systems are reviewed and negative except as per the HPI above.  Physical Exam: Vitals:   10/06/20 1036  BP: 130/82  Pulse: (!) 58  Weight: 90.5 kg  Height: '5\' 8"'$  (1.727 m)    GEN- The patient is well appearing obese male, alert and oriented x 3 today.   Head- normocephalic, atraumatic Eyes-  Sclera clear, conjunctiva pink Ears- hearing intact Oropharynx- clear Neck- supple  Lungs- Clear to ausculation bilaterally, normal work of breathing Heart- Regular rate and rhythm, no murmurs, rubs or gallops  GI- soft, NT, ND, + BS Extremities- no clubbing, cyanosis, or edema MS- no significant deformity or atrophy Skin- no rash or lesion Psych- euthymic mood, full affect Neuro- strength and sensation are intact  Wt Readings from Last 3 Encounters:  10/06/20 90.5 kg  08/18/20 91.6 kg  07/11/20 93.4 kg    EKG today demonstrates  SB, 1st degree AV block Vent. rate 58 BPM PR interval 220 ms QRS duration 110 ms QT/QTcB 474/465 ms  Echo 03/29/20 demonstrated   1. Left ventricular ejection fraction, by estimation, is 65 to 70%. The  left ventricle has normal function. The left ventricle has no regional  wall motion abnormalities. There is mild asymmetric left ventricular  hypertrophy of the basal-septal segment.  Left ventricular diastolic parameters  are indeterminate.   2. Right ventricular systolic function is normal. The right ventricular  size is normal. Tricuspid regurgitation signal is inadequate for assessing  PA pressure.   3. The mitral valve is normal in structure. Trivial mitral valve  regurgitation. No evidence of mitral stenosis.   4. The aortic valve is tricuspid. Aortic valve regurgitation is not  visualized. Mild aortic valve sclerosis is present, with no evidence of  aortic valve stenosis.   5. The inferior vena cava is normal in size with greater than 50%  respiratory variability, suggesting right atrial pressure of 3 mmHg.   Cardiac CT 09/01/19 The left atrial appendage is a large windsock morphology without thrombus. 2. A 31 mm watchman FLX device is recommended based on the above  landing zone measurements (24.8 maximum diameter; 20% compression). 3. There is no thrombus in the left atrial appendage.  Epic records are reviewed at length today  HAS-BLED score 1 Hypertension uncontrolled No  Abnormal renal and liver function (Dialysis, transplant, Cr >2.26 mg/dL /Cirrhosis or Bilirubin >2x Normal or AST/ALT/AP >3x Normal) No  Stroke No  Bleeding  yes high risk Labile INR (Unstable/high INR) No  Elderly (>65) Yes  Drugs or alcohol (? 8 drinks/week, anti-plt or NSAID) No   CHA2DS2-VASc Score = 3  The patient's score is based upon: CHF History: No HTN History: Yes Diabetes History: No Stroke History: No Vascular Disease History: No Age Score: 2 Gender Score: 0     ASSESSMENT AND PLAN: 1. Paroxysmal Atrial Fibrillation (ICD10:  I48.0) The patient's CHA2DS2-VASc score is 3, indicating a 3.2% annual risk of stroke.   I have seen Zachary Montoya is a 75 y.o. male in the office today who has been referred for a Watchman left atrial appendage closure device. Unfortunately, He is not felt to be a long term anticoagulation candidate secondary to high injury risk employment.  The patients chart has been reviewed  and they would be a candidate for short term oral anticoagulation.  Procedural risks for the Watchman implant have been reviewed with the patient including a 0.5% risk of stroke, <1% risk of perforation, <1% risk of device embolization. Patient scheduled for watchman implant on 10/26/20.  Check CXR Check bmet/cbc Continue Eliquis 5 mg BID, hold morning of procedure. Start ASA 81 mg daily on the day of procedure. Continue flecainide 100 mg BID Continue Toprol 25 mg daily  2. Secondary Hypercoagulable State (ICD10:  D68.69) The patient is at significant risk for stroke/thromboembolism based upon his CHA2DS2-VASc Score of 3.  Continue Apixaban (Eliquis). See plans above.  3. Obesity Body mass index is 30.35 kg/m. Lifestyle modification was discussed at length including regular exercise and weight reduction.  4. Obstructive sleep apnea The importance of adequate treatment of sleep apnea was discussed today in order to improve our ability to maintain sinus rhythm long term. Encouraged compliance with CPAP therapy.  5. HTN Stable, no changes today.   Follow up for Watchman implant 10/26/20.   [facility] [address] [place], [place] [address] [phone] 10/06/2020 10:44 AM

## 2020-10-16 ENCOUNTER — Other Ambulatory Visit: Payer: Self-pay

## 2020-10-16 DIAGNOSIS — I4891 Unspecified atrial fibrillation: Secondary | ICD-10-CM

## 2020-10-16 NOTE — Addendum Note (Signed)
Encounter addended by: Oliver Barre, PA on: 10/16/2020 8:28 AM  Actions taken: Level of Service modified

## 2020-10-18 DIAGNOSIS — H401123 Primary open-angle glaucoma, left eye, severe stage: Secondary | ICD-10-CM | POA: Diagnosis not present

## 2020-10-18 DIAGNOSIS — H401113 Primary open-angle glaucoma, right eye, severe stage: Secondary | ICD-10-CM | POA: Diagnosis not present

## 2020-10-25 ENCOUNTER — Telehealth: Payer: Self-pay

## 2020-10-25 NOTE — Telephone Encounter (Signed)
Reviewed instructions for Watchman procedure tomorrow. The patient has no questions. He was grateful for call and agrees with plan.

## 2020-10-26 ENCOUNTER — Other Ambulatory Visit: Payer: Self-pay

## 2020-10-26 ENCOUNTER — Inpatient Hospital Stay (HOSPITAL_COMMUNITY): Payer: Medicare HMO | Admitting: Anesthesiology

## 2020-10-26 ENCOUNTER — Encounter (HOSPITAL_COMMUNITY): Payer: Self-pay | Admitting: Cardiology

## 2020-10-26 ENCOUNTER — Inpatient Hospital Stay (HOSPITAL_COMMUNITY): Payer: Medicare HMO

## 2020-10-26 ENCOUNTER — Inpatient Hospital Stay (HOSPITAL_COMMUNITY)
Admission: RE | Admit: 2020-10-26 | Discharge: 2020-10-27 | DRG: 274 | Disposition: A | Discharge status: Home / Self Care | Payer: Medicare HMO | Attending: Cardiology | Admitting: Cardiology

## 2020-10-26 ENCOUNTER — Encounter (HOSPITAL_COMMUNITY)
Admission: RE | Disposition: A | Discharge status: Home / Self Care | Payer: Self-pay | Source: Home / Self Care | Attending: Cardiology

## 2020-10-26 DIAGNOSIS — Z7901 Long term (current) use of anticoagulants: Secondary | ICD-10-CM | POA: Diagnosis not present

## 2020-10-26 DIAGNOSIS — Z8249 Family history of ischemic heart disease and other diseases of the circulatory system: Secondary | ICD-10-CM | POA: Diagnosis not present

## 2020-10-26 DIAGNOSIS — I480 Paroxysmal atrial fibrillation: Secondary | ICD-10-CM | POA: Diagnosis not present

## 2020-10-26 DIAGNOSIS — E785 Hyperlipidemia, unspecified: Secondary | ICD-10-CM | POA: Diagnosis present

## 2020-10-26 DIAGNOSIS — Z823 Family history of stroke: Secondary | ICD-10-CM

## 2020-10-26 DIAGNOSIS — Z79899 Other long term (current) drug therapy: Secondary | ICD-10-CM

## 2020-10-26 DIAGNOSIS — Z85828 Personal history of other malignant neoplasm of skin: Secondary | ICD-10-CM | POA: Diagnosis not present

## 2020-10-26 DIAGNOSIS — I351 Nonrheumatic aortic (valve) insufficiency: Secondary | ICD-10-CM | POA: Diagnosis present

## 2020-10-26 DIAGNOSIS — H905 Unspecified sensorineural hearing loss: Secondary | ICD-10-CM | POA: Diagnosis present

## 2020-10-26 DIAGNOSIS — I4891 Unspecified atrial fibrillation: Secondary | ICD-10-CM

## 2020-10-26 DIAGNOSIS — Z809 Family history of malignant neoplasm, unspecified: Secondary | ICD-10-CM | POA: Diagnosis not present

## 2020-10-26 DIAGNOSIS — Z833 Family history of diabetes mellitus: Secondary | ICD-10-CM

## 2020-10-26 DIAGNOSIS — Z006 Encounter for examination for normal comparison and control in clinical research program: Secondary | ICD-10-CM

## 2020-10-26 DIAGNOSIS — G4733 Obstructive sleep apnea (adult) (pediatric): Secondary | ICD-10-CM | POA: Diagnosis present

## 2020-10-26 DIAGNOSIS — Z888 Allergy status to other drugs, medicaments and biological substances status: Secondary | ICD-10-CM

## 2020-10-26 DIAGNOSIS — I110 Hypertensive heart disease with heart failure: Secondary | ICD-10-CM | POA: Diagnosis present

## 2020-10-26 DIAGNOSIS — I10 Essential (primary) hypertension: Secondary | ICD-10-CM | POA: Diagnosis not present

## 2020-10-26 DIAGNOSIS — Z91030 Bee allergy status: Secondary | ICD-10-CM | POA: Diagnosis not present

## 2020-10-26 DIAGNOSIS — I088 Other rheumatic multiple valve diseases: Secondary | ICD-10-CM | POA: Diagnosis not present

## 2020-10-26 DIAGNOSIS — I313 Pericardial effusion (noninflammatory): Secondary | ICD-10-CM | POA: Diagnosis not present

## 2020-10-26 HISTORY — PX: TEE WITHOUT CARDIOVERSION: SHX5443

## 2020-10-26 HISTORY — PX: LEFT ATRIAL APPENDAGE OCCLUSION: EP1229

## 2020-10-26 LAB — TYPE AND SCREEN
ABO/RH(D): A POS
Antibody Screen: NEGATIVE

## 2020-10-26 LAB — CBC
HCT: 44.4 % (ref 39.0–52.0)
Hemoglobin: 14.9 g/dL (ref 13.0–17.0)
MCH: 32.3 pg (ref 26.0–34.0)
MCHC: 33.6 g/dL (ref 30.0–36.0)
MCV: 96.1 fL (ref 80.0–100.0)
Platelets: 182 K/uL (ref 150–400)
RBC: 4.62 MIL/uL (ref 4.22–5.81)
RDW: 12.5 % (ref 11.5–15.5)
WBC: 5.6 K/uL (ref 4.0–10.5)
nRBC: 0 % (ref 0.0–0.2)

## 2020-10-26 LAB — BASIC METABOLIC PANEL
Anion gap: 9 (ref 5–15)
BUN: 13 mg/dL (ref 8–23)
CO2: 23 mmol/L (ref 22–32)
Calcium: 9 mg/dL (ref 8.9–10.3)
Chloride: 106 mmol/L (ref 98–111)
Creatinine, Ser: 0.97 mg/dL (ref 0.61–1.24)
GFR, Estimated: >60 mL/min (ref >60)
Glucose, Bld: 122 mg/dL — ABNORMAL HIGH (ref 70–99)
Potassium: 3.7 mmol/L (ref 3.5–5.1)
Sodium: 138 mmol/L (ref 135–145)

## 2020-10-26 LAB — POCT ACTIVATED CLOTTING TIME: Activated Clotting Time: 295 seconds

## 2020-10-26 LAB — SURGICAL PCR SCREEN
MRSA, PCR: NEGATIVE
Staphylococcus aureus: NEGATIVE

## 2020-10-26 LAB — ABO/RH: ABO/RH(D): A POS

## 2020-10-26 SURGERY — LEFT ATRIAL APPENDAGE OCCLUSION
Anesthesia: General

## 2020-10-26 MED ORDER — ACETAMINOPHEN 325 MG PO TABS: 650.0000 mg | ORAL_TABLET | ORAL | Status: DC | PRN

## 2020-10-26 MED ORDER — SODIUM CHLORIDE 0.9 % IV SOLN: INTRAVENOUS | Status: DC

## 2020-10-26 MED ORDER — CEFAZOLIN SODIUM-DEXTROSE 2-4 GM/100ML-% IV SOLN
INTRAVENOUS | Status: AC
  Filled 2020-10-26: qty 100

## 2020-10-26 MED ORDER — HEPARIN SODIUM (PORCINE) 1000 UNIT/ML IJ SOLN
INTRAMUSCULAR | Status: DC | PRN
  Administered 2020-10-26: 13000 [IU] via INTRAVENOUS

## 2020-10-26 MED ORDER — FENTANYL CITRATE (PF) 250 MCG/5ML IJ SOLN
INTRAMUSCULAR | Status: DC | PRN
  Administered 2020-10-26 (×2): 50 ug via INTRAVENOUS

## 2020-10-26 MED ORDER — CEFAZOLIN SODIUM-DEXTROSE 2-4 GM/100ML-% IV SOLN
2.0000 g | INTRAVENOUS | Status: AC
  Administered 2020-10-26: 2 g via INTRAVENOUS

## 2020-10-26 MED ORDER — DEXAMETHASONE SODIUM PHOSPHATE 10 MG/ML IJ SOLN
INTRAMUSCULAR | Status: DC | PRN
  Administered 2020-10-26: 10 mg via INTRAVENOUS

## 2020-10-26 MED ORDER — ASPIRIN 81 MG PO CHEW
81.0000 mg | CHEWABLE_TABLET | Freq: Every day | ORAL | Status: DC
  Administered 2020-10-26 – 2020-10-27 (×2): 81 mg via ORAL
  Filled 2020-10-26 (×2): qty 1

## 2020-10-26 MED ORDER — ROCURONIUM BROMIDE 10 MG/ML (PF) SYRINGE
PREFILLED_SYRINGE | INTRAVENOUS | Status: DC | PRN
  Administered 2020-10-26: 60 mg via INTRAVENOUS

## 2020-10-26 MED ORDER — PANTOPRAZOLE SODIUM 40 MG PO TBEC
40.0000 mg | DELAYED_RELEASE_TABLET | Freq: Every day | ORAL | Status: DC
  Administered 2020-10-27: 40 mg via ORAL
  Filled 2020-10-26: qty 1

## 2020-10-26 MED ORDER — LACTATED RINGERS IV SOLN: INTRAVENOUS | Status: DC

## 2020-10-26 MED ORDER — SODIUM CHLORIDE 0.9% FLUSH
3.0000 mL | Freq: Two times a day (BID) | INTRAVENOUS | Status: DC
  Administered 2020-10-26 – 2020-10-27 (×2): 3 mL via INTRAVENOUS

## 2020-10-26 MED ORDER — FLECAINIDE ACETATE 100 MG PO TABS
100.0000 mg | ORAL_TABLET | Freq: Two times a day (BID) | ORAL | Status: DC
  Administered 2020-10-26 – 2020-10-27 (×2): 100 mg via ORAL
  Filled 2020-10-26 (×3): qty 1

## 2020-10-26 MED ORDER — HEPARIN (PORCINE) IN NACL 2000-0.9 UNIT/L-% IV SOLN
INTRAVENOUS | Status: AC
  Filled 2020-10-26: qty 1000

## 2020-10-26 MED ORDER — METOPROLOL SUCCINATE ER 25 MG PO TB24
25.0000 mg | ORAL_TABLET | Freq: Every day | ORAL | Status: DC
  Administered 2020-10-26: 25 mg via ORAL
  Filled 2020-10-26 (×2): qty 1

## 2020-10-26 MED ORDER — SODIUM CHLORIDE 0.9 % IV SOLN: 250.0000 mL | INTRAVENOUS | Status: DC | PRN

## 2020-10-26 MED ORDER — HEPARIN (PORCINE) IN NACL 2000-0.9 UNIT/L-% IV SOLN
INTRAVENOUS | Status: DC | PRN
  Administered 2020-10-26: 1000 mL

## 2020-10-26 MED ORDER — IOPAMIDOL (ISOVUE-370) INJECTION 76%: INTRAVENOUS | Status: DC | PRN

## 2020-10-26 MED ORDER — HEPARIN (PORCINE) IN NACL 1000-0.9 UT/500ML-% IV SOLN
INTRAVENOUS | Status: AC
  Filled 2020-10-26: qty 1000

## 2020-10-26 MED ORDER — PROPOFOL 10 MG/ML IV BOLUS
INTRAVENOUS | Status: DC | PRN
  Administered 2020-10-26: 170 mg via INTRAVENOUS

## 2020-10-26 MED ORDER — APIXABAN 5 MG PO TABS
5.0000 mg | ORAL_TABLET | Freq: Two times a day (BID) | ORAL | Status: DC
  Administered 2020-10-26 – 2020-10-27 (×2): 5 mg via ORAL
  Filled 2020-10-26 (×2): qty 1

## 2020-10-26 MED ORDER — EPHEDRINE SULFATE-NACL 50-0.9 MG/10ML-% IV SOSY
PREFILLED_SYRINGE | INTRAVENOUS | Status: DC | PRN
  Administered 2020-10-26: 10 mg via INTRAVENOUS

## 2020-10-26 MED ORDER — SODIUM CHLORIDE 0.9% FLUSH: 3.0000 mL | INTRAVENOUS | Status: DC | PRN

## 2020-10-26 MED ORDER — ATORVASTATIN CALCIUM 10 MG PO TABS
20.0000 mg | ORAL_TABLET | Freq: Every day | ORAL | Status: DC
  Administered 2020-10-27: 20 mg via ORAL
  Filled 2020-10-26: qty 2

## 2020-10-26 MED ORDER — CHLORHEXIDINE GLUCONATE 0.12 % MT SOLN
OROMUCOSAL | Status: AC
  Administered 2020-10-26: 15 mL
  Filled 2020-10-26: qty 15

## 2020-10-26 MED ORDER — SUGAMMADEX SODIUM 200 MG/2ML IV SOLN
INTRAVENOUS | Status: DC | PRN
  Administered 2020-10-26: 200 mg via INTRAVENOUS

## 2020-10-26 MED ORDER — ONDANSETRON HCL 4 MG/2ML IJ SOLN: 4.0000 mg | Freq: Four times a day (QID) | INTRAMUSCULAR | Status: DC | PRN

## 2020-10-26 MED ORDER — IOHEXOL 350 MG/ML SOLN
INTRAVENOUS | Status: DC | PRN
  Administered 2020-10-26 (×2): 10 mL

## 2020-10-26 MED ORDER — LIDOCAINE 2% (20 MG/ML) 5 ML SYRINGE
INTRAMUSCULAR | Status: DC | PRN
  Administered 2020-10-26: 40 mg via INTRAVENOUS

## 2020-10-26 MED ORDER — HEPARIN (PORCINE) IN NACL 1000-0.9 UT/500ML-% IV SOLN
INTRAVENOUS | Status: DC | PRN
  Administered 2020-10-26 (×2): 500 mL

## 2020-10-26 MED ORDER — PROTAMINE SULFATE 10 MG/ML IV SOLN
INTRAVENOUS | Status: DC | PRN
  Administered 2020-10-26: 30 mg via INTRAVENOUS

## 2020-10-26 MED ORDER — ONDANSETRON HCL 4 MG/2ML IJ SOLN
INTRAMUSCULAR | Status: DC | PRN
  Administered 2020-10-26: 4 mg via INTRAVENOUS

## 2020-10-26 SURGICAL SUPPLY — 16 items
CATH DIAG 6FR PIGTAIL ANGLED (CATHETERS) ×2 IMPLANT
CLOSURE PERCLOSE PROSTYLE (VASCULAR PRODUCTS) ×4 IMPLANT
DILATOR VESSEL 38 20CM 12FR (INTRODUCER) ×2 IMPLANT
DILATOR VESSEL 38 20CM 16FR (INTRODUCER) ×2 IMPLANT
KIT HEART LEFT (KITS) ×2 IMPLANT
KIT VERSACROSS LRG ACCESS (CATHETERS) ×2 IMPLANT
PACK CARDIAC CATHETERIZATION (CUSTOM PROCEDURE TRAY) ×2 IMPLANT
PAD PRO RADIOLUCENT 2001M-C (PAD) ×2 IMPLANT
SHEATH PERFORMER 16FR 30 (SHEATH) ×2 IMPLANT
SHEATH PINNACLE 8F 10CM (SHEATH) ×2 IMPLANT
SHEATH PROBE COVER 6X72 (BAG) ×2 IMPLANT
TUBING CIL FLEX 10 FLL-RA (TUBING) ×2 IMPLANT
WATCHMAN FLX 27 (Prosthesis & Implant Heart) ×2 IMPLANT
WATCHMAN FLX PROCEDURE DEVICE (KITS) ×2 IMPLANT
WATCHMAN PROCED TRUSEAL ACCESS (SHEATH) ×2 IMPLANT
WATCHMAN TRUSEAL DOUBLE CURVE (SHEATH) ×2 IMPLANT

## 2020-10-26 NOTE — H&P (Signed)
Electrophysiology Office Follow up Visit Note:     Date:  08/18/2020    ID:  [name], DOB 1945-10-19, MRN [id number]   PCP:  Crist Infante, MD           [facility] Cardiologist:  None  [facility] Electrophysiologist:  Will Meredith Leeds, MD      Interval History:     Nooh Markunas is a 75 y.o. male who presents for a visit to discuss watchman implant at the request of Dr. Curt Bears.  The patient has a diagnosis of paroxysmal atrial fibrillation.  The patient was last seen by Dr. Curt Bears on Jul 06, 2020.  At that appointment with Dr. Curt Bears the patient expressed an interest in avoiding long-term exposure anticoagulation given frequent injuries with work.   Patient confirms the above with me today.  He tells me that in the past when he took a blood thinner he had terrible headaches and discontinued them.  He also is very active and frequently gets caught and has bruising and is concerned about the long-term risks of being on a blood thinner and the risk for serious bleeding.  He has done an extensive amount of research on the watchman device.  He actually brought with him the [facility] trial printed today to clinic.         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   atorvastatin (LIPITOR) 20 MG tablet Take 20 mg by mouth daily.    B Complex-C (B-COMPLEX WITH VITAMIN C) tablet Take 1 tablet by mouth daily.   Calcium Carbonate-Vitamin D (CALCIUM-D PO) Take 1 tablet by mouth daily. 600 mg calcium + 800 iu vit. d   cetirizine (ZYRTEC) 10 MG tablet Take by mouth as needed.   Cholecalciferol (VITAMIN D3) 2000 UNITS TABS Take 2,000 Units by mouth daily.   Coenzyme Q10 (COQ10 PO) Take 300 mg by mouth daily.   ferrous sulfate 325 (65 FE) MG tablet Take 325 mg by mouth daily.   flecainide (TAMBOCOR) 100 MG tablet Take 1 tablet (100 mg total) by mouth 2 (two) times daily.   metoprolol succinate (TOPROL-XL) 25 MG 24 hr tablet Take 1 tablet (25 mg total) by mouth daily.   Multiple Vitamins-Minerals (CENTRUM ADULTS PO) Take 1 tablet by mouth daily.   Omega-3 Fatty Acids (OMEGA 3 PO) Take 1,280 mg by mouth daily.   omeprazole (PRILOSEC) 20 MG capsule Take 20 mg by mouth daily.   triamcinolone cream (KENALOG) 0.1 % Apply 1 application topically 2 (two) times daily as needed.   VITAMIN D PO Take 2,000 Units by mouth.        Allergies:   Timolol, Bee venom, and Brimonidine tartrate    Social History  Socioeconomic History   Marital status: Married      Spouse name: Not on file   Number of children: Not on file   Years of education: Not on file   Highest education level: Not on file  Occupational History   Not on file  Tobacco Use   Smoking status: Never   Smokeless tobacco: Never  Vaping Use   Vaping Use: Never used  Substance and Sexual Activity   Alcohol use: No      Alcohol/week: 0.0 standard drinks   Drug use: No   Sexual activity: Not on file  Other Topics Concern   Not on file  Social History Narrative   Not on file    Social Determinants of Health    Financial Resource Strain: Not on file  Food Insecurity: Not on file  Transportation Needs: Not on file  Physical Activity: Not on file  Stress: Not on file  Social Connections: Not on file      Family History: The patient's family history  includes Cancer in his brother; Diabetes in his mother; Hypertension in his mother; Stroke in his mother. There is no history of Heart attack.   ROS:   Please see the history of present illness.    All other systems reviewed and are negative.   EKGs/Labs/Other Studies Reviewed:     The following studies were reviewed today:     May 07, 2020 Zio patch Max 124 bpm 01:02pm, 02/04 Min 40 bpm 01:57am, 02/11 Avg 55 bpm <1% ventricular and supraventricular ectopy Predominant rhythm was sinus rhythm One run of SVT, 18 beats at 119 BPM No other arrhthymias noted No symptoms reported     March 29, 2020 echo personally reviewed Left ventricular function normal, 65% Right ventricular function normal No significant valvular abnormalities Normal left atrial size       Recent Labs: 03/29/2020: BUN 18; Creatinine, Ser 0.99; Hemoglobin 15.5; Platelets 214; Potassium 4.0; Sodium 137  Recent Lipid Panel Labs (Brief)  No results found for: CHOL, TRIG, HDL, CHOLHDL, VLDL, LDLCALC, LDLDIRECT     Physical Exam:     VS:  BP 130/70   Pulse (!) 59   Ht '5\' 8"'$  (1.727 m)   Wt 202 lb (91.6 kg)   SpO2 99%   BMI 30.71 kg/m         Wt Readings from Last 3 Encounters:  08/18/20 202 lb (91.6 kg)  07/11/20 206 lb (93.4 kg)  07/06/20 203 lb (92.1 kg)      GEN:  Well nourished, well developed in no acute distress HEENT: Normal NECK: No JVD; No carotid bruits LYMPHATICS: No lymphadenopathy CARDIAC: RRR, no murmurs, rubs, gallops RESPIRATORY:  Clear to auscultation without rales, wheezing or rhonchi  ABDOMEN: Soft, non-tender, non-distended MUSCULOSKELETAL:  No edema; No deformity  SKIN: Warm and dry NEUROLOGIC:  Alert and oriented x 3 PSYCHIATRIC:  Normal affect    ASSESSMENT:     1. Paroxysmal atrial fibrillation (HCC)   2. Essential hypertension     PLAN:     In order of problems listed above:     1. Paroxysmal atrial fibrillation (HCC) The patient has paroxysmal atrial  fibrillation with an overall low burden of atrial fibrillation well maintained on flecainide.  He is not currently taking an anticoagulant because of fear of injuring himself at work.  He presents today to discuss an alternative stroke prophylaxis strategy using the watchman device.  I have seen Ardelle Anton Leano in the office today who  is being considered for a Watchman left atrial appendage closure device. I believe they will benefit from this procedure given their history of atrial fibrillation, CHA2DS2-VASc score of 2 (3 in 2 months) and unadjusted ischemic stroke rate of 2.9% (3.2% in 2 months) per year. Unfortunately, the patient is not felt to be a long term anticoagulation candidate secondary to risk of personal injury with work and patient's strong desire to avoid exposure to anticoagulation. The patient's chart has been reviewed and I feel that they would be a candidate for short term oral anticoagulation after Watchman implant.    It is my belief that after undergoing a LAA closure procedure, [name] will not need long term anticoagulation which eliminates anticoagulation side effects and major bleeding risk.    Procedural risks for the Watchman implant have been reviewed with the patient including a 0.5% risk of stroke, <1% risk of perforation and <1% risk of device embolization.    The published clinical data on the safety and effectiveness of WATCHMAN include but are not limited to the following: - Holmes DR, Mechele Claude, Sick P et al. for the PROTECT AF Investigators. Percutaneous closure of the left atrial appendage versus warfarin therapy for prevention of stroke in patients with atrial fibrillation: a randomised non-inferiority trial. Lancet 2009; 374: 534-42. Mechele Claude, Doshi SK, Abelardo Diesel D et al. on behalf of the PROTECT AF Investigators. Percutaneous Left Atrial Appendage Closure for Stroke Prophylaxis in Patients With Atrial Fibrillation 2.3-Year Follow-up of the  PROTECT AF (Watchman Left Atrial Appendage System for Embolic Protection in Patients With Atrial Fibrillation) Trial. Circulation 2013; 127:720-729. - Alli O, Doshi S,  Kar S, Reddy VY, Sievert H et al. Quality of Life Assessment in the Randomized PROTECT AF (Percutaneous Closure of the Left Atrial Appendage Versus Warfarin Therapy for Prevention of Stroke in Patients With Atrial Fibrillation) Trial of Patients at Risk for Stroke With Nonvalvular Atrial Fibrillation. J Am Coll Cardiol 2013; N8865744. Vertell Limber DR, Tarri Abernethy, Price M, Satartia, Sievert H, Doshi S, Huber K, Reddy V. Prospective randomized evaluation of the Watchman left atrial appendage Device in patients with atrial fibrillation versus long-term warfarin therapy; the PREVAIL trial. Journal of the [employer] of Cardiology, Vol. 4, No. 1, 2014, 1-11. - Kar S, Doshi SK, Sadhu A, Horton R, Osorio J et al. Primary outcome evaluation of a next-generation left atrial appendage closure device: results from the PINNACLE FLX trial. Circulation 2021;143(18)1754-1762.      After today's visit with the patient which was dedicated solely for shared decision making visit regarding LAA closure device, the patient decided to proceed with the LAA appendage closure procedure scheduled to be done in the near future at [facility]. Prior to the procedure, I would like to obtain a gated CT scan of the chest with contrast timed for PV/LA visualization.    Mr. Rover we will go ahead and start Eliquis 5 mg twice daily in anticipation of upcoming watchman implant.  He will continue this through the periprocedural period and at least 45 days after the implant.  We will also add aspirin 81 mg by mouth at the time of implant.  If the device is well-seated without a leak at the 45-day mark after implant, we will plan to add Plavix and discontinue Eliquis.      I have seen, examined the patient, and reviewed the above assessment and plan.    Plan for  Kieler today.   Zayneb Baucum T  Quentin Ore, MD 10/26/2020 7:25 AM

## 2020-10-26 NOTE — Anesthesia Preprocedure Evaluation (Signed)
Anesthesia Evaluation  Patient identified by MRN, date of birth, ID band Patient awake    Reviewed: Allergy & Precautions, H&P , NPO status , Patient's Chart, lab work & pertinent test results  Airway Mallampati: II   Neck ROM: full    Dental   Pulmonary sleep apnea ,    breath sounds clear to auscultation       Cardiovascular hypertension, + dysrhythmias Atrial Fibrillation  Rhythm:regular Rate:Normal     Neuro/Psych    GI/Hepatic   Endo/Other    Renal/GU      Musculoskeletal   Abdominal   Peds  Hematology   Anesthesia Other Findings   Reproductive/Obstetrics                             Anesthesia Physical Anesthesia Plan  ASA: 3  Anesthesia Plan: General   Post-op Pain Management:    Induction: Intravenous  PONV Risk Score and Plan: 2 and Ondansetron, Dexamethasone and Treatment may vary due to age or medical condition  Airway Management Planned: Oral ETT  Additional Equipment: Arterial line  Intra-op Plan:   Post-operative Plan: Extubation in OR  Informed Consent: I have reviewed the patients History and Physical, chart, labs and discussed the procedure including the risks, benefits and alternatives for the proposed anesthesia with the patient or authorized representative who has indicated his/her understanding and acceptance.     Dental advisory given  Plan Discussed with: CRNA, Anesthesiologist and Surgeon  Anesthesia Plan Comments:         Anesthesia Quick Evaluation

## 2020-10-26 NOTE — Plan of Care (Signed)

## 2020-10-26 NOTE — Progress Notes (Signed)
Echocardiogram Echocardiogram Transesophageal has been performed as part of the Watchman procedure.  Oneal Deputy Tanelle Lanzo RDCS 10/26/2020, 9:12 AM

## 2020-10-26 NOTE — Anesthesia Procedure Notes (Signed)
Procedure Name: Intubation Date/Time: 10/26/2020 7:47 AM Performed by: Lance Coon, CRNA Pre-anesthesia Checklist: Emergency Drugs available, Suction available, Patient identified, Patient being monitored and Timeout performed Patient Re-evaluated:Patient Re-evaluated prior to induction Oxygen Delivery Method: Circle system utilized Preoxygenation: Pre-oxygenation with 100% oxygen Induction Type: IV induction Ventilation: Mask ventilation without difficulty and Oral airway inserted - appropriate to patient size Laryngoscope Size: Miller and 3 Grade View: Grade I Tube type: Oral Tube size: 7.5 mm Number of attempts: 1 Airway Equipment and Method: Stylet Placement Confirmation: ETT inserted through vocal cords under direct vision, positive ETCO2 and breath sounds checked- equal and bilateral Secured at: 21 cm Tube secured with: Tape Dental Injury: Teeth and Oropharynx as per pre-operative assessment

## 2020-10-26 NOTE — Transfer of Care (Signed)
Immediate Anesthesia Transfer of Care Note  Patient: Zachary Montoya  Procedure(s) Performed: LEFT ATRIAL APPENDAGE OCCLUSION TRANSESOPHAGEAL ECHOCARDIOGRAM (TEE)  Patient Location: PACU  Anesthesia Type:General  Level of Consciousness: drowsy and patient cooperative  Airway & Oxygen Therapy: Patient Spontanous Breathing  Post-op Assessment: Report given to RN and Post -op Vital signs reviewed and stable  Post vital signs: Reviewed and stable  Last Vitals:  Vitals Value Taken Time  BP 169/82 10/26/20 0912  Temp 36.3 C 10/26/20 0904  Pulse 59 10/26/20 0913  Resp 11 10/26/20 0913  SpO2 96 % 10/26/20 0913  Vitals shown include unvalidated device data.  Last Pain:  Vitals:   10/26/20 0904  TempSrc: Temporal  PainSc: 0-No pain         Complications: No notable events documented.

## 2020-10-26 NOTE — Discharge Summary (Signed)
[facility]   MULTIDISCIPLINARY HEART VALVE TEAM   STRUCTURAL HEART PROCEDURE DISCHARGE SUMMARY   Patient ID: Zachary Montoya,  MRN: [id number], DOB/AGE: [birth year] 75 y.o.  Admit date: 10/26/2020 Discharge date: 10/27/2020  Primary Care Physician: Crist Infante, MD  Primary Cardiologist: None  Electrophysiologist: Will Meredith Leeds, MD  Primary Discharge Diagnosis:  Paroxysmal Atrial Fibrillation Poor candidacy for long term anticoagulation due to high risk occupation  Secondary Discharge Diagnosis:  Obesity OSA HTN   Procedures This Admission:  Transeptal Puncture Intra-procedural TEE which showed no LAA thrombus Left atrial appendage occlusive device placement on 10/26/20 by Dr. Quentin Ore.  This study demonstrated: IMPRESSIONS   1. Left ventricular ejection fraction, by estimation, is 60 to 65%. The  left ventricle has normal function. The left ventricle has no regional  wall motion abnormalities.   2. Right ventricular systolic function is normal. The right ventricular  size is normal.   3. Left atrial appendage windsock morphology with average ostial diameter  of 21 mm. A 27 mm Watchman FLX device is implanted, is stable and well  seated with 22% compression and no peripheral leak. 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  Left atrial size was mildly dilated. No left atrial/left atrial appendage  thrombus was detected.   4. The mitral valve is normal in structure. Trivial mitral valve  regurgitation. No evidence of mitral stenosis.   5. The aortic valve is tricuspid. Aortic valve regurgitation is trivial.  No aortic stenosis is present.   6. The inferior vena cava is normal in size with greater than 50%  respiratory variability, suggesting right atrial pressure of 3 mmHg.   Conclusion(s)/Recommendation(s): TEE was used for pre-procedure  measurements and  planning, guidance of transseptal puncture, device  deployment, post procedure assessment , including 3D reconstruction of  images.    Brief HPI: Zachary Montoya is a 75 y.o. male with a history of Paroxysmal Atrial Fibrillation. The patient was seen as an outpatient and thought to be a poor candidate for continued anticoagulation due to high risk occupation. Risks, benefits, and alternatives to left atrial appendage occlusive device placement device were reviewed with the patient who wished to proceed.  The patient underwent intra-procedural TEE as above.   Hospital Course:  The patient was admitted and underwent Left Atrial appendage occlusive device placement with details as outlined above.  They were monitored on telemetry overnight which demonstrated sinus.  Groin was without complication on the day of discharge.  The patient was examined and considered to be stable for discharge.  Wound care and restrictions were reviewed with the patient.  The patient will be seen back by Nell Range PA-C in 1 month and a repeat TEE in approximately 45 days post procedure to ensure proper seal of the device. They will follow up with Dr. Quentin Ore in 6 months  CHA2DS2/VAS Stroke Risk Points  Current as of 15 minutes ago    3 >= 2 Points: High Risk 1 - 1.99 Points: Medium Risk 0 Points: Low Risk   No Change    Details   This score determines the patient's risk of having a stroke if the patient has atrial fibrillation.    Points Metrics 0 Has Congestive Heart Failure:  No   Current as of 15 minutes ago 0 Has Vascular Disease:  No   Current as of 15 minutes ago 1 Has Hypertension:  Yes   Current as of 15 minutes ago 2 Age:  63   Current as of 15 minutes ago 0 Has Diabetes:  No   Current as of 15 minutes ago 0 Had Stroke:  No  Had TIA:  No  Had Thromboembolism:  No   Current as of 15 minutes ago 0 Male:  No   Current as of 15 minutes ago         The patient understands the importance of continuing on their Eliquis  and a 81 mg ASA for 45 days.They understand that after 45 days, they will then take 81 mg ASA and Plavix for a total of 6 months post procedure followed by aspirin alone.    Physical Exam: Vitals:   10/26/20 2015 10/26/20 2352 10/27/20 0311 10/27/20 0700  BP: (!) 145/84 132/80 130/72 126/79  Pulse: 82 72 62 63  Resp: '19 19 16 16  '$ Temp: 97.8 F (36.6 C) 98.7 F (37.1 C) 98.2 F (36.8 C) 98.2 F (36.8 C)  TempSrc: Oral Oral Oral Oral  SpO2: 92% 94% 93% 93%  Weight:      Height:        GEN- The patient is well appearing, alert and oriented x 3 today.   HEENT: normocephalic, atraumatic; sclera clear, conjunctiva pink; hearing intact; oropharynx clear; neck supple  Lungs- Clear to ausculation bilaterally, normal work of breathing.  No wheezes, rales, rhonchi Heart- Regular rate and rhythm, no murmurs, rubs or gallops  GI- soft, non-tender, non-distended, bowel sounds present  Extremities- no clubbing, cyanosis, or edema; DP/PT/radial pulses 2+ bilaterally, groin without hematoma/bruit MS- no significant deformity or atrophy Skin- warm and dry, no rash or lesion Psych- euthymic mood, full affect Neuro- strength and sensation are intact   Labs:   Lab Results  Component Value Date   WBC 5.6 10/26/2020   HGB 14.9 10/26/2020   HCT 44.4 10/26/2020   MCV 96.1 10/26/2020   PLT 182 10/26/2020    Recent Labs  Lab 10/27/20 0059  NA 135  K 4.1  CL 105  CO2 22  BUN 14  CREATININE 0.82  CALCIUM 9.2  GLUCOSE 136*     Discharge Medications:  Allergies as of 10/27/2020       Reactions   Timolol    Bradycardia/dizziness Other reaction(s): Dizziness, Dizziness (intolerance) Bradycardia/dizziness   Bee Venom Itching, Swelling   Brimonidine Tartrate Other (See Comments)   Burning, itching, and discomfort         Medication List     TAKE these medications    apixaban 5 MG Tabs tablet Commonly known as: ELIQUIS Take 1 tablet (5 mg total) by mouth 2 (two) times daily.    aspirin 81 MG chewable tablet Chew 1 tablet (81 mg total) by mouth daily.   atorvastatin 20 MG tablet Commonly known as: LIPITOR Take 20 mg by mouth daily.   B-complex with vitamin C tablet Take 1 tablet by mouth daily.   CALCIUM-D PO Take 1 tablet by mouth daily. 600 mg calcium + 800 iu vit. d   CENTRUM ADULTS PO Take 1 tablet by mouth daily.   cetirizine 10 MG tablet Commonly known as: ZYRTEC Take 10 mg by mouth daily.   COQ10 PO Take 300 mg by mouth daily.   ferrous sulfate 325 (65 FE) MG tablet Take 325 mg by mouth daily.   flecainide 100 MG tablet Commonly known as: TAMBOCOR Take 1 tablet (100 mg total) by mouth 2 (two) times daily.  metoprolol succinate 25 MG 24 hr tablet Commonly known as: TOPROL-XL Take 1 tablet (25 mg total) by mouth daily.   OMEGA 3 PO Take 1,280 mg by mouth daily.   omeprazole 20 MG capsule Commonly known as: PRILOSEC Take 20 mg by mouth daily.   triamcinolone 55 MCG/ACT Aero nasal inhaler Commonly known as: NASACORT Place 1-2 sprays into the nose daily as needed (allergies).   Vitamin D3 50 MCG (2000 UT) Tabs Take 2,000 Units by mouth daily.        Disposition:  Home   Follow-up Information     Eileen Stanford, PA-C. Go on 11/29/2020.   Specialties: Cardiology, Radiology Why: @ 1:30pm, please arrive at least 10 minutes early. Contact information: [place] [id number] [phone]                 Duration of Discharge Encounter: Greater than 30 minutes including physician time.  Signed, Angelena Form, PA-C  10/27/2020 10:00 AM

## 2020-10-26 NOTE — Discharge Instructions (Signed)
WATCHMANT Procedure, Care After  Procedure MD: Dr. Lambert Watchman Clinical Coordinator: Katy Kemp RN   This sheet gives you information about how to care for yourself after your procedure. Your health care provider may also give you more specific instructions. If you have problems or questions, contact your health care provider.  What can I expect after the procedure? After the procedure, it is common to have: Bruising around your puncture site. Tenderness around your puncture site. Tiredness (fatigue).  Medication instructions It is very important to continue to take your blood thinner as directed by your doctor after the Watchman procedure. Call your procedure doctor's office with question or concerns. If you are on Coumadin (warfarin), you will have your INR checked the week after your procedure, with a goal INR of 2.0 - 3.0. Please follow your medication instructions on your discharge summary. Only take the medications listed on your discharge paperwork.  Follow up You will be seen in 1 month after your procedure in the [facility] You will have another TEE (Transesophageal Echocardiogram) approximately 6 weeks after your procedure mark to check your device You will follow up the MD/APP who performed your procedure 6 months after your procedure The Watchman Clinical Coordinator will check in with you from time to time, including 1 and 2 years after your procedure.    Follow these instructions at home: Puncture site care  Follow instructions from your health care provider about how to take care of your puncture site. Make sure you: If present, leave stitches (sutures), skin glue, or adhesive strips in place.  If a large square bandage is present, this may be removed 24 hours after surgery.  Check your puncture site every day for signs of infection. Check for: Redness, swelling, or pain. Fluid or blood. If your puncture site starts to bleed, lie down on your back,  apply firm pressure to the area, and contact your health care provider. Warmth. Pus or a bad smell. Driving Do not drive yourself home if you received sedation Do not drive for at least 4 days after your procedure or however long your health care provider recommends. (Do not resume driving if you have previously been instructed not to drive for other health reasons.) Do not spend greater than 1 hour at a time in a car for the first 3 days. Stop and take a break with a 5 minute walk at least every hour.  Do not drive or use heavy machinery while taking prescription pain medicine.  Activity Avoid activities that take a lot of effort, including exercise, for at least 7 days after your procedure. For the first 3 days, avoid sitting for longer than one hour at a time.  Avoid alcoholic beverages, signing paperwork, or participating in legal proceedings for 24 hours after receiving sedation Do not lift anything that is heavier than 10 lb (4.5 kg) for one week.  No sexual activity for 1 week.  Return to your normal activities as told by your health care provider. Ask your health care provider what activities are safe for you. General instructions Take over-the-counter and prescription medicines only as told by your health care provider. Do not use any products that contain nicotine or tobacco, such as cigarettes and e-cigarettes. If you need help quitting, ask your health care provider. You may shower after 24 hours, but Do not take baths, swim, or use a hot tub for 1 week.  Do not drink alcohol for 24 hours after your procedure. Keep all   follow-up visits as told by your health care provider. This is important. Dental Work: You will require antibiotics prior to any dental work, including cleanings, for 6 months after your Watchman implantation to help protect you from infection. After 6 months, antibiotics are no longer required. Contact a health care provider if: You have redness, mild swelling, or  pain around your puncture site. You have soreness in your throat or at your puncture site that does not improve after several days You have fluid or blood coming from your puncture site that stops after applying firm pressure to the area. Your puncture site feels warm to the touch. You have pus or a bad smell coming from your puncture site. You have a fever. You have chest pain or discomfort that spreads to your neck, jaw, or arm. You are sweating a lot. You feel nauseous. You have a fast or irregular heartbeat. You have shortness of breath. You are dizzy or light-headed and feel the need to lie down. You have pain or numbness in the arm or leg closest to your puncture site. Get help right away if: Your puncture site suddenly swells. Your puncture site is bleeding and the bleeding does not stop after applying firm pressure to the area. These symptoms may represent a serious problem that is an emergency. Do not wait to see if the symptoms will go away. Get medical help right away. Call your local emergency services (911 in the [place] Do not drive yourself to the hospital. Summary After the procedure, it is normal to have bruising and tenderness at the puncture site in your groin, neck, or forearm. Check your puncture site every day for signs of infection. Get help right away if your puncture site is bleeding and the bleeding does not stop after applying firm pressure to the area. This is a medical emergency.  This information is not intended to replace advice given to you by your health care provider. Make sure you discuss any questions you have with your health care provider.   

## 2020-10-26 NOTE — Op Note (Signed)
  [facility]   MULTIDISCIPLINARY HEART TEAM  Watchman [place] Procedure   Surgeon: Lars Mage, MD Co-Surgeon: Sherren Mocha, MD Anesthesia: Albertha Ghee, MD Imager: Sanda Klein, MD   Procedure: 1) Transseptal Puncture 2) Watchman left atrial appendage occlusion 3) Perclose right femoral vein   Device Implant: 27 mm Watchman FLX Device   Background and Indication: 75 yo with paroxysmal atrial fibrillation, high-risk occupation, CHADS-Vasc of 3, felt to be a poor candidate for long term anticoagulation. He is referred for the above procedure   Procedure description: US guidance is used for right femoral venous access. [place] images are stored digitally in the patient's chart. Double Preclose is performed at 10" and 2" positions using normal technique and a after progressively dilating the femoral vein over a Versacross wire, a 16 Fr sheath is inserted. Transseptal puncture is then performed after fully anticoagulating the patient with unfractionated heparin. A therapeutic ACT greater than 250 seconds is achieved. A Versacross system is used with RF energy to cross the interatrial septum under fluoroscopic and TEE guidance. Please see Dr Mardene Speak detailed report for further description of transseptal puncture and Watchman Access Sheath insertion.   Once the 14 Fr access sheath is in appropriate position in the left atrial appendage, a 27 mm Watchman Flex device is inserted and deployed, demonstrating appropriate position and compression in the left atrial appendage. Thorough TEE imaging is performed to evaluate all PASS criteria prior to device release. Device compression is 20-22% with no peri-device leak identified. After device release, the device remains in stable position with complete occlusion of the appendage. The sheath is removed from the body and the previously deployed perclose sutures are tightened for complete hemostasis.   Conclusion: Successful implantation  of a 27 mm Watchman left atrial appendage occlusion device using fluoroscopic and transesophageal echo guidance  Sherren Mocha 10/26/2020 9:15 AM

## 2020-10-27 DIAGNOSIS — I480 Paroxysmal atrial fibrillation: Secondary | ICD-10-CM | POA: Diagnosis not present

## 2020-10-27 DIAGNOSIS — E785 Hyperlipidemia, unspecified: Secondary | ICD-10-CM | POA: Diagnosis not present

## 2020-10-27 DIAGNOSIS — Z006 Encounter for examination for normal comparison and control in clinical research program: Secondary | ICD-10-CM | POA: Diagnosis not present

## 2020-10-27 DIAGNOSIS — I110 Hypertensive heart disease with heart failure: Secondary | ICD-10-CM | POA: Diagnosis not present

## 2020-10-27 LAB — BASIC METABOLIC PANEL
Anion gap: 8 (ref 5–15)
BUN: 14 mg/dL (ref 8–23)
CO2: 22 mmol/L (ref 22–32)
Calcium: 9.2 mg/dL (ref 8.9–10.3)
Chloride: 105 mmol/L (ref 98–111)
Creatinine, Ser: 0.82 mg/dL (ref 0.61–1.24)
GFR, Estimated: >60 mL/min (ref >60)
Glucose, Bld: 136 mg/dL — ABNORMAL HIGH (ref 70–99)
Potassium: 4.1 mmol/L (ref 3.5–5.1)
Sodium: 135 mmol/L (ref 135–145)

## 2020-10-27 MED ORDER — ASPIRIN 81 MG PO CHEW: 81.0000 mg | CHEWABLE_TABLET | Freq: Every day | ORAL | Status: AC

## 2020-10-27 NOTE — Anesthesia Postprocedure Evaluation (Signed)
Anesthesia Post Note  Patient: Zachary Montoya  Procedure(s) Performed: LEFT ATRIAL APPENDAGE OCCLUSION TRANSESOPHAGEAL ECHOCARDIOGRAM (TEE)     Patient location during evaluation: PACU Anesthesia Type: General Level of consciousness: awake and alert Pain management: pain level controlled Vital Signs Assessment: post-procedure vital signs reviewed and stable Respiratory status: spontaneous breathing, nonlabored ventilation, respiratory function stable and patient connected to nasal cannula oxygen Cardiovascular status: blood pressure returned to baseline and stable Postop Assessment: no apparent nausea or vomiting Anesthetic complications: no   No notable events documented.  Last Vitals:  Vitals:   10/27/20 0311 10/27/20 0700  BP: 130/72 126/79  Pulse: 62 63  Resp: 16 16  Temp: 36.8 C 36.8 C  SpO2: 93% 93%    Last Pain:  Vitals:   10/27/20 0900  TempSrc:   PainSc: 0-No pain                 Becci Batty S

## 2020-10-31 ENCOUNTER — Telehealth: Payer: Self-pay

## 2020-10-31 NOTE — Telephone Encounter (Signed)
Called to conduct TOC call.  Left message to call back.

## 2020-11-01 NOTE — Telephone Encounter (Signed)
  [place] Team  Contacted the patient regarding discharge from [facility] on 10/27/2020  The patient understands to follow up with Nell Range, [place] on 11/29/2020 in preparation for his TEE on 12/07/2020.  The patient understands discharge instructions? Yes  The patient understands medications and regimen? Yes and states he is taking his Eliquis and ASA as instructed.  The patient reports groin sites look healthy with so S/S of bleeding or infection.  The patient understands to call with any questions or concerns prior to scheduled visit.

## 2020-11-07 ENCOUNTER — Other Ambulatory Visit: Payer: Self-pay

## 2020-11-07 ENCOUNTER — Ambulatory Visit (INDEPENDENT_AMBULATORY_CARE_PROVIDER_SITE_OTHER): Payer: Medicare HMO | Admitting: Physician Assistant

## 2020-11-07 ENCOUNTER — Encounter: Payer: Self-pay | Admitting: Physician Assistant

## 2020-11-07 DIAGNOSIS — D044 Carcinoma in situ of skin of scalp and neck: Secondary | ICD-10-CM

## 2020-11-07 DIAGNOSIS — C44212 Basal cell carcinoma of skin of right ear and external auricular canal: Secondary | ICD-10-CM

## 2020-11-07 NOTE — Patient Instructions (Signed)

## 2020-11-13 ENCOUNTER — Encounter: Payer: Self-pay | Admitting: Physician Assistant

## 2020-11-13 NOTE — Progress Notes (Signed)
   Follow-Up Visit   Subjective  Zachary Montoya is a 75 y.o. male who presents for the following: Procedure (Here for treatment Right scaphoid fossa- bcc x 1 &/Left posterior neck cis x 1).   The following portions of the chart were reviewed this encounter and updated as appropriate:  Tobacco  Allergies  Meds  Problems  Med Hx  Surg Hx  Fam Hx      Objective  Well appearing patient in no apparent distress; mood and affect are within normal limits.  A focused examination was performed including face and neck. Relevant physical exam findings are noted in the Assessment and Plan.  Right Scaphoid Fossa Pink macule [id number]  Left Posterior Neck Pink macule  [id number]   Assessment & Plan  Basal cell carcinoma (BCC) of skin of right ear Right Scaphoid Fossa  Destruction of lesion Complexity: simple   Destruction method: electrodesiccation and curettage   Informed consent: discussed and consent obtained   Timeout:  patient name, date of birth, surgical site, and procedure verified Anesthesia: the lesion was anesthetized in a standard fashion   Anesthetic:  1% lidocaine w/ epinephrine 1-100,000 local infiltration Curettage performed in three different directions: Yes   Electrodesiccation performed over the curetted area: Yes   Curettage cycles:  3 Final wound size (cm):  2 Hemostasis achieved with:  ferric subsulfate Outcome: patient tolerated procedure well with no complications   Additional details:  Wound innoculated with 5 fluorouracil solution.  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  Destruction of lesion Complexity: simple   Destruction method: electrodesiccation and curettage   Informed consent: discussed and consent obtained   Timeout:  patient name, date of birth, surgical site, and procedure verified Anesthesia: the lesion was anesthetized in a standard fashion   Anesthetic:  1% lidocaine w/ epinephrine 1-100,000  local infiltration Curettage performed in three different directions: Yes   Electrodesiccation performed over the curetted area: Yes   Curettage cycles:  3 Final wound size (cm):  1.5 Hemostasis achieved with:  ferric subsulfate Outcome: patient tolerated procedure well with no complications   Additional details:  Wound innoculated with 5 fluorouracil solution.    I, Zelda Reames, PA-C, have reviewed all documentation's for this visit.  The documentation on 11/13/20 for the exam, diagnosis, procedures and orders are all accurate and complete.

## 2020-11-20 ENCOUNTER — Other Ambulatory Visit (HOSPITAL_BASED_OUTPATIENT_CLINIC_OR_DEPARTMENT_OTHER): Payer: Self-pay

## 2020-11-20 MED ORDER — FLUAD QUADRIVALENT 0.5 ML IM PRSY
PREFILLED_SYRINGE | INTRAMUSCULAR | 0 refills | Status: AC
  Filled 2020-11-20: qty 0.5, 1d supply, fill #0

## 2020-11-28 ENCOUNTER — Encounter (HOSPITAL_COMMUNITY): Payer: Self-pay | Admitting: Internal Medicine

## 2020-11-29 ENCOUNTER — Encounter: Payer: Self-pay | Admitting: Physician Assistant

## 2020-11-29 ENCOUNTER — Ambulatory Visit: Payer: Medicare HMO | Admitting: Physician Assistant

## 2020-11-29 ENCOUNTER — Other Ambulatory Visit: Payer: Self-pay

## 2020-11-29 VITALS — BP 114/76 | HR 55 | Ht 68.0 in | Wt 202.0 lb

## 2020-11-29 DIAGNOSIS — I10 Essential (primary) hypertension: Secondary | ICD-10-CM | POA: Diagnosis not present

## 2020-11-29 DIAGNOSIS — G4733 Obstructive sleep apnea (adult) (pediatric): Secondary | ICD-10-CM

## 2020-11-29 DIAGNOSIS — Z95818 Presence of other cardiac implants and grafts: Secondary | ICD-10-CM

## 2020-11-29 DIAGNOSIS — I480 Paroxysmal atrial fibrillation: Secondary | ICD-10-CM

## 2020-11-29 NOTE — H&P (View-Only) (Signed)
[facility]                                     Cardiology Office Note:    Date:  11/29/2020   ID:  Zachary Montoya, DOB 02-17-1946, MRN [id number]  PCP:  Crist Infante, MD  [facility] Cardiologist:  None  [facility] Electrophysiologist:  Will Meredith Leeds, MD   Referring MD: Crist Infante, MD   1 month s/p LAAO- set up 45 day TEE.   History of Present Illness:    Zachary Montoya is a 75 y.o. male with a hx of obesity, OSA, HTN, PAF and poor candidacy for long term anticoagulation due to high risk occupation who underwent successful LAAO with a Watchman Flex on 10/26/20 who presents to clinic for follow up.  As above, he underwent successful LAAO with a 27 mm Watchman FLX by Dr. Quentin Ore on 10/26/20. Intraoperative TEE showed a well seated device with 22% compression and no peripheral leak. He was discharged on Eliquis 5mg  BID and ASA 81 mg for 45 days with plans to transition to DAPT with ASA/Plavix if 45 day TEE showed an adequate seal. This has been set up for 12/07/20 with Dr. Margaretann Loveless.  Today he presents to clinic for follow up. No CP or SOB. No LE edema, orthopnea or PND. No dizziness or syncope. No blood in stool or urine. No palpitations.     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   Left Posterior Neck   Sleep apnea     Past Surgical History:  Procedure Laterality Date   APPENDECTOMY     LEFT ATRIAL APPENDAGE OCCLUSION N/A 10/26/2020   Procedure: LEFT ATRIAL APPENDAGE OCCLUSION;  Surgeon: Vickie Epley, MD;  Location: [facility];  Service: Cardiovascular;   Laterality: N/A;   NO PAST SURGERIES     TEE WITHOUT CARDIOVERSION N/A 10/26/2020   Procedure: TRANSESOPHAGEAL ECHOCARDIOGRAM (TEE);  Surgeon: Vickie Epley, MD;  Location: [facility];  Service: Cardiovascular;  Laterality: N/A;    Current Medications: Current Meds  Medication Sig   apixaban (ELIQUIS) 5 MG TABS tablet Take 1 tablet (5 mg total) by mouth 2 (two) times daily.   aspirin 81 MG chewable tablet Chew 1 tablet (81 mg total) by mouth daily.   atorvastatin (LIPITOR) 20 MG tablet Take 20 mg by mouth daily.   B Complex-C (B-COMPLEX WITH VITAMIN C) tablet Take 1 tablet by mouth daily.   Calcium Carbonate-Vitamin D (CALCIUM-D PO) Take 1 tablet by mouth daily. 600 mg calcium + 800 iu vit. d   cetirizine (ZYRTEC) 10 MG tablet Take 10 mg by mouth daily.   Cholecalciferol (VITAMIN D3) 2000 UNITS TABS Take 2,000 Units by mouth daily.   Coenzyme Q10 (COQ10 PO) Take 300 mg by mouth daily.   ferrous sulfate 325 (65 FE) MG tablet Take 325 mg by mouth daily.   flecainide (TAMBOCOR) 100 MG tablet Take 1 tablet (100 mg total) by mouth 2 (two) times daily.   influenza vaccine adjuvanted (FLUAD QUADRIVALENT) 0.5 ML injection  Inject into the muscle.   metoprolol succinate (TOPROL-XL) 25 MG 24 hr tablet Take 1 tablet (25 mg total) by mouth daily.   Multiple Vitamins-Minerals (CENTRUM ADULTS PO) Take 1 tablet by mouth daily.   Omega-3 Fatty Acids (OMEGA 3 PO) Take 1,280 mg by mouth daily.   omeprazole (PRILOSEC) 20 MG capsule Take 20 mg by mouth daily.   triamcinolone (NASACORT) 55 MCG/ACT AERO nasal inhaler Place 1-2 sprays into the nose daily as needed (allergies).     Allergies:   Timolol, Bee venom, and Brimonidine tartrate   Social History   Socioeconomic History   Marital status: Married    Spouse name: Not on file   Number of children: Not on file   Years of education: Not on file   Highest education level: Not on file  Occupational History   Not on file  Tobacco Use    Smoking status: Never   Smokeless tobacco: Never  Vaping Use   Vaping Use: Never used  Substance and Sexual Activity   Alcohol use: Yes    Alcohol/week: 1.0 standard drink    Types: 1 Cans of beer per week   Drug use: No   Sexual activity: Not on file  Other Topics Concern   Not on file  Social History Narrative   Not on file   Social Determinants of Health   Financial Resource Strain: Not on file  Food Insecurity: Not on file  Transportation Needs: Not on file  Physical Activity: Not on file  Stress: Not on file  Social Connections: Not on file     Family History: The patient's family history includes Cancer in his brother; Diabetes in his mother; Hypertension in his mother; Stroke in his mother. There is no history of Heart attack.  ROS:   Please see the history of present illness.    All other systems reviewed and are negative.  EKGs/Labs/Other Studies Reviewed:    The following studies were reviewed today:  TEE 10/26/20 Transeptal Puncture Intra-procedural TEE which showed no LAA thrombus Left atrial appendage occlusive device placement on 10/26/20 by Dr. Quentin Ore.  This study demonstrated: IMPRESSIONS   1. Left ventricular ejection fraction, by estimation, is 60 to 65%. The  left ventricle has normal function. The left ventricle has no regional  wall motion abnormalities.   2. Right ventricular systolic function is normal. The right ventricular  size is normal.   3. Left atrial appendage windsock morphology with average ostial diameter  of 21 mm. A 27 mm Watchman FLX device is implanted, is stable and well  seated with 22% compression and no peripheral leak. 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  Left atrial size was mildly dilated. No left atrial/left atrial appendage  thrombus was detected.   4. The mitral valve is normal in structure. Trivial mitral valve   regurgitation. No evidence of mitral stenosis.   5. The aortic valve is tricuspid. Aortic valve regurgitation is trivial.  No aortic stenosis is present.   6. The inferior vena cava is normal in size with greater than 50%  respiratory variability, suggesting right atrial pressure of 3 mmHg.   Conclusion(s)/Recommendation(s): TEE was used for pre-procedure  measurements and planning, guidance of transseptal puncture, device  deployment, post procedure assessment , including 3D reconstruction of  images.   ______________________  LAAO 10/26/20 CONCLUSIONS:  1.Successful implantation of a WATCHMAN left atrial appendage  occlusive device    2. TEE demonstrating no LAA thrombus 3. No early apparent complications.  4. Plan for Apixaban 5mg  PO BID and Aspirin 81mg  PO daily until the 45 day follow up. If no leak or thrombus at that TEE will transition to Aspirin 81mg  PO daily and Plavix 75mg  PO daily.    EKG:  EKG is ordered today.  The ekg ordered today demonstrates sinus brady with 1st deg AV block. HR 55   Recent Labs: 10/26/2020: Hemoglobin 14.9; Platelets 182 10/27/2020: BUN 14; Creatinine, Ser 0.82; Potassium 4.1; Sodium 135  Recent Lipid Panel No results found for: CHOL, TRIG, HDL, CHOLHDL, VLDL, LDLCALC, LDLDIRECT   Risk Assessment/Calculations:    CHA2DS2-VASc Score = 3   This indicates a 3.2% annual risk of stroke. The patient's score is based upon: CHF History: 0 HTN History: 1 Diabetes History: 0 Stroke History: 0 Vascular Disease History: 0 Age Score: 2 Gender Score: 0      Physical Exam:    VS:  BP 114/76 (BP Location: Left Arm, Patient Position: Sitting, Cuff Size: Normal)   Pulse (!) 55   Ht 5\' 8"  (1.727 m)   Wt 202 lb (91.6 kg)   SpO2 97%   BMI 30.71 kg/m     Wt Readings from Last 3 Encounters:  11/29/20 202 lb (91.6 kg)  10/26/20 195 lb (88.5 kg)  10/06/20 199 lb 9.6 oz (90.5 kg)     GEN:  Well nourished, well developed in no acute distress HEENT:  Normal NECK: No JVD; No carotid bruits LYMPHATICS: No lymphadenopathy CARDIAC: RRR, no murmurs, rubs, gallops RESPIRATORY:  Clear to auscultation without rales, wheezing or rhonchi  ABDOMEN: Soft, non-tender, non-distended MUSCULOSKELETAL:  No edema; No deformity  SKIN: Warm and dry NEUROLOGIC:  Alert and oriented x 3 PSYCHIATRIC:  Normal affect   ASSESSMENT:    1. Presence of Watchman left atrial appendage closure device   2. Paroxysmal atrial fibrillation (HCC)   3. Essential hypertension   4. OSA (obstructive sleep apnea)    PLAN:    In order of problems listed above:  PAF: maintaining sinus on Flecainide 100 mg BID. Underwent LAAO with a 27 mm Watchman FLX by Dr. Quentin Ore on 10/26/20. Continue Eliquis 5mg  BID and aspirin 81mg  daily. Will transition to DAPT with ASA 81 mg daily and Plavix 75mg  daily if 45 day TEE shows an adequate seal. This has been set up for 12/07/20 with Dr. Margaretann Loveless.  Shared Decision Making/Informed Consent The risks [esophageal damage, perforation (1:10,000 risk), bleeding, pharyngeal hematoma as well as other potential complications associated with conscious sedation including aspiration, arrhythmia, respiratory failure and death], benefits (treatment guidance and diagnostic support) and alternatives of a transesophageal echocardiogram were discussed in detail with Zachary Montoya and he is willing to proceed.    HTN: BP well controlled. No changes made today.   OSA: continue CPAP    Medication Adjustments/Labs and Tests Ordered: Current medicines are reviewed at length with the patient today.  Concerns regarding medicines are outlined above.  Orders Placed This Encounter  Procedures   Basic metabolic panel   CBC   EKG 12-Lead    No orders of the defined types were placed in this encounter.   Patient Instructions  Medication Instructions:  Your physician recommends that you continue on your current medications as directed. Please refer to the Current  Medication list given to you today.   *If you need a refill on your cardiac medications before your next appointment, please  call your pharmacy*   Lab Work: Bmp, Cbc - Today   If you have labs (blood work) drawn today and your tests are completely normal, you will receive your results only by: [place] (if you have MyChart) OR A paper copy in the mail If you have any lab test that is abnormal or we need to change your treatment, we will call you to review the results.   Testing/Procedures Instructions below    Follow-Up: Follow up as scheduled    Other Instruction You are scheduled for a TEE with Dr. Cherlynn Kaiser  Please arrive at the [facility] (Main Entrance A) at [facility]: [address] [place], [place] [id number] at 8:45 am (Arrive 1 hour prior to procedure)  DIET: Nothing to eat or drink after midnight except a sip of water with medications (see medication instructions below)  FYI: For your safety, and to allow [place] to monitor your vital signs accurately during the surgery/procedure we request that   if you have artificial nails, gel coating, SNS etc. Please have those removed prior to your surgery/procedure. Not having the nail coverings /polish removed may result in cancellation or delay of your surgery/procedure.   Medication Instructions:  Continue your anticoagulant: Eliquis  You will need to continue your anticoagulant after your procedure until you are told by your provider that it is safe to stop    You must have a responsible person to drive you home and stay in the waiting area during your procedure. Failure to do so could result in cancellation.  Bring your insurance cards.  *Special Note: Every effort is made to have your procedure done on time. Occasionally there are emergencies that occur at the hospital that may cause delays. Please be patient if a delay does occur.    Signed, Angelena Form, PA-C  11/29/2020 2:14 PM    [facility]

## 2020-11-29 NOTE — Patient Instructions (Addendum)
Medication Instructions:  Your physician recommends that you continue on your current medications as directed. Please refer to the Current Medication list given to you today.   *If you need a refill on your cardiac medications before your next appointment, please call your pharmacy*   Lab Work: Bmp, Cbc - Today   If you have labs (blood work) drawn today and your tests are completely normal, you will receive your results only by: [place] (if you have MyChart) OR A paper copy in the mail If you have any lab test that is abnormal or we need to change your treatment, we will call you to review the results.   Testing/Procedures Instructions below    Follow-Up: Follow up as scheduled    Other Instruction You are scheduled for a TEE on 12/07/20 with Dr. Cherlynn Kaiser  Please arrive at the [facility] (Main Entrance A) at [facility]: [place], [place] [address] at 8:45 am (Arrive 1 hour prior to procedure)  DIET: Nothing to eat or drink after midnight except a sip of water with medications (see medication instructions below)  FYI: For your safety, and to allow [place] to monitor your vital signs accurately during the surgery/procedure we request that   if you have artificial nails, gel coating, SNS etc. Please have those removed prior to your surgery/procedure. Not having the nail coverings /polish removed may result in cancellation or delay of your surgery/procedure.   Medication Instructions:  Continue your anticoagulant: Eliquis  You will need to continue your anticoagulant after your procedure until you are told by your provider that it is safe to stop    You must have a responsible person to drive you home and stay in the waiting area during your procedure. Failure to do so could result in cancellation.  Bring your insurance cards.  *Special Note: Every effort is made to have your procedure done on time. Occasionally there are emergencies that  occur at the hospital that may cause delays. Please be patient if a delay does occur.

## 2020-11-29 NOTE — Progress Notes (Signed)
[facility] [place]                                     Cardiology Office Note:    Date:  11/29/2020   ID:  Zachary Montoya, DOB 08/05/1945, MRN [id number]  PCP:  Crist Infante, MD  [facility] Cardiologist:  None  [facility] Electrophysiologist:  Will Meredith Leeds, MD   Referring MD: Crist Infante, MD   1 month s/p LAAO- set up 45 day TEE.   History of Present Illness:    Zachary Montoya is a 75 y.o. male with a hx of obesity, OSA, HTN, PAF and poor candidacy for long term anticoagulation due to high risk occupation who underwent successful LAAO with a Watchman Flex on 10/26/20 who presents to clinic for follow up.  As above, he underwent successful LAAO with a 27 mm Watchman FLX by Dr. Quentin Ore on 10/26/20. Intraoperative TEE showed a well seated device with 22% compression and no peripheral leak. He was discharged on Eliquis 5mg  BID and ASA 81 mg for 45 days with plans to transition to DAPT with ASA/Plavix if 45 day TEE showed an adequate seal. This has been set up for 12/07/20 with Dr. Margaretann Loveless.  Today he presents to clinic for follow up. No CP or SOB. No LE edema, orthopnea or PND. No dizziness or syncope. No blood in stool or urine. No palpitations.     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   Left Posterior Neck   Sleep apnea     Past Surgical History:  Procedure Laterality Date   APPENDECTOMY     LEFT ATRIAL APPENDAGE OCCLUSION N/A 10/26/2020   Procedure: LEFT ATRIAL APPENDAGE OCCLUSION;  Surgeon: Vickie Epley, MD;  Location: [place] [facility];  Service: Cardiovascular;   Laterality: N/A;   NO PAST SURGERIES     TEE WITHOUT CARDIOVERSION N/A 10/26/2020   Procedure: TRANSESOPHAGEAL ECHOCARDIOGRAM (TEE);  Surgeon: Vickie Epley, MD;  Location: [place] [facility];  Service: Cardiovascular;  Laterality: N/A;    Current Medications: Current Meds  Medication Sig   apixaban (ELIQUIS) 5 MG TABS tablet Take 1 tablet (5 mg total) by mouth 2 (two) times daily.   aspirin 81 MG chewable tablet Chew 1 tablet (81 mg total) by mouth daily.   atorvastatin (LIPITOR) 20 MG tablet Take 20 mg by mouth daily.   B Complex-C (B-COMPLEX WITH VITAMIN C) tablet Take 1 tablet by mouth daily.   Calcium Carbonate-Vitamin D (CALCIUM-D PO) Take 1 tablet by mouth daily. 600 mg calcium + 800 iu vit. d   cetirizine (ZYRTEC) 10 MG tablet Take 10 mg by mouth daily.   Cholecalciferol (VITAMIN D3) 2000 UNITS TABS Take 2,000 Units by mouth daily.   Coenzyme Q10 (COQ10 PO) Take 300 mg by mouth daily.   ferrous sulfate 325 (65 FE) MG tablet Take 325 mg by mouth daily.   flecainide (TAMBOCOR) 100 MG tablet Take 1 tablet (100 mg total) by mouth 2 (two) times daily.   influenza vaccine adjuvanted (FLUAD QUADRIVALENT) 0.5 ML injection  Inject into the muscle.   metoprolol succinate (TOPROL-XL) 25 MG 24 hr tablet Take 1 tablet (25 mg total) by mouth daily.   Multiple Vitamins-Minerals (CENTRUM ADULTS PO) Take 1 tablet by mouth daily.   Omega-3 Fatty Acids (OMEGA 3 PO) Take 1,280 mg by mouth daily.   omeprazole (PRILOSEC) 20 MG capsule Take 20 mg by mouth daily.   triamcinolone (NASACORT) 55 MCG/ACT AERO nasal inhaler Place 1-2 sprays into the nose daily as needed (allergies).     Allergies:   Timolol, Bee venom, and Brimonidine tartrate   Social History   Socioeconomic History   Marital status: Married    Spouse name: Not on file   Number of children: Not on file   Years of education: Not on file   Highest education level: Not on file  Occupational History   Not on file  Tobacco Use    Smoking status: Never   Smokeless tobacco: Never  Vaping Use   Vaping Use: Never used  Substance and Sexual Activity   Alcohol use: Yes    Alcohol/week: 1.0 standard drink    Types: 1 Cans of beer per week   Drug use: No   Sexual activity: Not on file  Other Topics Concern   Not on file  Social History Narrative   Not on file   Social Determinants of Health   Financial Resource Strain: Not on file  Food Insecurity: Not on file  Transportation Needs: Not on file  Physical Activity: Not on file  Stress: Not on file  Social Connections: Not on file     Family History: The patient's family history includes Cancer in his brother; Diabetes in his mother; Hypertension in his mother; Stroke in his mother. There is no history of Heart attack.  ROS:   Please see the history of present illness.    All other systems reviewed and are negative.  EKGs/Labs/Other Studies Reviewed:    The following studies were reviewed today:  TEE 10/26/20 Transeptal Puncture Intra-procedural TEE which showed no LAA thrombus Left atrial appendage occlusive device placement on 10/26/20 by Dr. Quentin Ore.  This study demonstrated: IMPRESSIONS   1. Left ventricular ejection fraction, by estimation, is 60 to 65%. The  left ventricle has normal function. The left ventricle has no regional  wall motion abnormalities.   2. Right ventricular systolic function is normal. The right ventricular  size is normal.   3. Left atrial appendage windsock morphology with average ostial diameter  of 21 mm. A 27 mm Watchman FLX device is implanted, is stable and well  seated with 22% compression and no peripheral leak. 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  Left atrial size was mildly dilated. No left atrial/left atrial appendage  thrombus was detected.   4. The mitral valve is normal in structure. Trivial mitral valve   regurgitation. No evidence of mitral stenosis.   5. The aortic valve is tricuspid. Aortic valve regurgitation is trivial.  No aortic stenosis is present.   6. The inferior vena cava is normal in size with greater than 50%  respiratory variability, suggesting right atrial pressure of 3 mmHg.   Conclusion(s)/Recommendation(s): TEE was used for pre-procedure  measurements and planning, guidance of transseptal puncture, device  deployment, post procedure assessment , including 3D reconstruction of  images.   ______________________  LAAO 10/26/20 CONCLUSIONS:  1.Successful implantation of a WATCHMAN left atrial appendage  occlusive device    2. TEE demonstrating no LAA thrombus 3. No early apparent complications.  4. Plan for Apixaban 5mg  PO BID and Aspirin 81mg  PO daily until the 45 day follow up. If no leak or thrombus at that TEE will transition to Aspirin 81mg  PO daily and Plavix 75mg  PO daily.    EKG:  EKG is ordered today.  The ekg ordered today demonstrates sinus brady with 1st deg AV block. HR 55   Recent Labs: 10/26/2020: Hemoglobin 14.9; Platelets 182 10/27/2020: BUN 14; Creatinine, Ser 0.82; Potassium 4.1; Sodium 135  Recent Lipid Panel No results found for: CHOL, TRIG, HDL, CHOLHDL, VLDL, LDLCALC, LDLDIRECT   Risk Assessment/Calculations:    CHA2DS2-VASc Score = 3   This indicates a 3.2% annual risk of stroke. The patient's score is based upon: CHF History: 0 HTN History: 1 Diabetes History: 0 Stroke History: 0 Vascular Disease History: 0 Age Score: 2 Gender Score: 0      Physical Exam:    VS:  BP 114/76 (BP Location: Left Arm, Patient Position: Sitting, Cuff Size: Normal)   Pulse (!) 55   Ht 5\' 8"  (1.727 m)   Wt 202 lb (91.6 kg)   SpO2 97%   BMI 30.71 kg/m     Wt Readings from Last 3 Encounters:  11/29/20 202 lb (91.6 kg)  10/26/20 195 lb (88.5 kg)  10/06/20 199 lb 9.6 oz (90.5 kg)     GEN:  Well nourished, well developed in no acute distress HEENT:  Normal NECK: No JVD; No carotid bruits LYMPHATICS: No lymphadenopathy CARDIAC: RRR, no murmurs, rubs, gallops RESPIRATORY:  Clear to auscultation without rales, wheezing or rhonchi  ABDOMEN: Soft, non-tender, non-distended MUSCULOSKELETAL:  No edema; No deformity  SKIN: Warm and dry NEUROLOGIC:  Alert and oriented x 3 PSYCHIATRIC:  Normal affect   ASSESSMENT:    1. Presence of Watchman left atrial appendage closure device   2. Paroxysmal atrial fibrillation (HCC)   3. Essential hypertension   4. OSA (obstructive sleep apnea)    PLAN:    In order of problems listed above:  PAF: maintaining sinus on Flecainide 100 mg BID. Underwent LAAO with a 27 mm Watchman FLX by Dr. Quentin Ore on 10/26/20. Continue Eliquis 5mg  BID and aspirin 81mg  daily. Will transition to DAPT with ASA 81 mg daily and Plavix 75mg  daily if 45 day TEE shows an adequate seal. This has been set up for 12/07/20 with Dr. Margaretann Loveless.  Shared Decision Making/Informed Consent The risks [esophageal damage, perforation (1:10,000 risk), bleeding, pharyngeal hematoma as well as other potential complications associated with conscious sedation including aspiration, arrhythmia, respiratory failure and death], benefits (treatment guidance and diagnostic support) and alternatives of a transesophageal echocardiogram were discussed in detail with Mr. Ericsson and he is willing to proceed.    HTN: BP well controlled. No changes made today.   OSA: continue CPAP    Medication Adjustments/Labs and Tests Ordered: Current medicines are reviewed at length with the patient today.  Concerns regarding medicines are outlined above.  Orders Placed This Encounter  Procedures   Basic metabolic panel   CBC   EKG 12-Lead    No orders of the defined types were placed in this encounter.   Patient Instructions  Medication Instructions:  Your physician recommends that you continue on your current medications as directed. Please refer to the Current  Medication list given to you today.   *If you need a refill on your cardiac medications before your next appointment, please  call your pharmacy*   Lab Work: Bmp, Cbc - Today   If you have labs (blood work) drawn today and your tests are completely normal, you will receive your results only by: [facility] (if you have MyChart) OR A paper copy in the mail If you have any lab test that is abnormal or we need to change your treatment, we will call you to review the results.   Testing/Procedures Instructions below    Follow-Up: Follow up as scheduled    Other Instruction You are scheduled for a TEE with Dr. Cherlynn Kaiser  Please arrive at the [facility] (Main Entrance A) at [facility]: [place], [place] [address] at 8:45 am (Arrive 1 hour prior to procedure)  DIET: Nothing to eat or drink after midnight except a sip of water with medications (see medication instructions below)  FYI: For your safety, and to allow [place] to monitor your vital signs accurately during the surgery/procedure we request that   if you have artificial nails, gel coating, SNS etc. Please have those removed prior to your surgery/procedure. Not having the nail coverings /polish removed may result in cancellation or delay of your surgery/procedure.   Medication Instructions:  Continue your anticoagulant: Eliquis  You will need to continue your anticoagulant after your procedure until you are told by your provider that it is safe to stop    You must have a responsible person to drive you home and stay in the waiting area during your procedure. Failure to do so could result in cancellation.  Bring your insurance cards.  *Special Note: Every effort is made to have your procedure done on time. Occasionally there are emergencies that occur at the hospital that may cause delays. Please be patient if a delay does occur.    Signed, Angelena Form, PA-C  11/29/2020 2:14 PM    [facility]

## 2020-11-30 LAB — CBC
Hematocrit: 41.7 % (ref 37.5–51.0)
Hemoglobin: 14.2 g/dL (ref 13.0–17.7)
MCH: 32.3 pg (ref 26.6–33.0)
MCHC: 34.1 g/dL (ref 31.5–35.7)
MCV: 95 fL (ref 79–97)
Platelets: 214 10*3/uL (ref 150–450)
RBC: 4.39 x10E6/uL (ref 4.14–5.80)
RDW: 12.3 % (ref 11.6–15.4)
WBC: 6.6 10*3/uL (ref 3.4–10.8)

## 2020-11-30 LAB — BASIC METABOLIC PANEL
BUN/Creatinine Ratio: 16 (ref 10–24)
BUN: 15 mg/dL (ref 8–27)
CO2: 23 mmol/L (ref 20–29)
Calcium: 9.5 mg/dL (ref 8.6–10.2)
Chloride: 102 mmol/L (ref 96–106)
Creatinine, Ser: 0.91 mg/dL (ref 0.76–1.27)
Glucose: 100 mg/dL — ABNORMAL HIGH (ref 70–99)
Potassium: 4.2 mmol/L (ref 3.5–5.2)
Sodium: 140 mmol/L (ref 134–144)
eGFR: 88 mL/min/{1.73_m2} (ref >59)

## 2020-12-04 DIAGNOSIS — D649 Anemia, unspecified: Secondary | ICD-10-CM | POA: Diagnosis not present

## 2020-12-04 DIAGNOSIS — Z125 Encounter for screening for malignant neoplasm of prostate: Secondary | ICD-10-CM | POA: Diagnosis not present

## 2020-12-04 DIAGNOSIS — E781 Pure hyperglyceridemia: Secondary | ICD-10-CM | POA: Diagnosis not present

## 2020-12-04 DIAGNOSIS — R7301 Impaired fasting glucose: Secondary | ICD-10-CM | POA: Diagnosis not present

## 2020-12-05 DIAGNOSIS — H401122 Primary open-angle glaucoma, left eye, moderate stage: Secondary | ICD-10-CM | POA: Diagnosis not present

## 2020-12-05 DIAGNOSIS — H5213 Myopia, bilateral: Secondary | ICD-10-CM | POA: Diagnosis not present

## 2020-12-05 DIAGNOSIS — H2513 Age-related nuclear cataract, bilateral: Secondary | ICD-10-CM | POA: Diagnosis not present

## 2020-12-06 ENCOUNTER — Encounter: Payer: Self-pay | Admitting: Physician Assistant

## 2020-12-06 ENCOUNTER — Other Ambulatory Visit: Payer: Self-pay

## 2020-12-06 ENCOUNTER — Ambulatory Visit: Payer: Medicare HMO | Admitting: Physician Assistant

## 2020-12-06 DIAGNOSIS — D485 Neoplasm of uncertain behavior of skin: Secondary | ICD-10-CM

## 2020-12-06 DIAGNOSIS — C44629 Squamous cell carcinoma of skin of left upper limb, including shoulder: Secondary | ICD-10-CM | POA: Diagnosis not present

## 2020-12-06 NOTE — Patient Instructions (Signed)

## 2020-12-07 ENCOUNTER — Encounter (HOSPITAL_COMMUNITY): Payer: Self-pay | Admitting: Internal Medicine

## 2020-12-07 ENCOUNTER — Ambulatory Visit (HOSPITAL_COMMUNITY)
Admission: RE | Admit: 2020-12-07 | Discharge: 2020-12-07 | Disposition: A | Discharge status: Home / Self Care | Payer: Medicare HMO | Attending: Internal Medicine | Admitting: Internal Medicine

## 2020-12-07 ENCOUNTER — Ambulatory Visit (HOSPITAL_COMMUNITY): Payer: Medicare HMO | Admitting: Certified Registered"

## 2020-12-07 ENCOUNTER — Ambulatory Visit (HOSPITAL_BASED_OUTPATIENT_CLINIC_OR_DEPARTMENT_OTHER)
Admission: RE | Admit: 2020-12-07 | Discharge: 2020-12-07 | Disposition: A | Discharge status: Home / Self Care | Payer: Medicare HMO | Source: Ambulatory Visit | Attending: Cardiology | Admitting: Cardiology

## 2020-12-07 ENCOUNTER — Encounter (HOSPITAL_COMMUNITY)
Admission: RE | Disposition: A | Discharge status: Home / Self Care | Payer: Self-pay | Source: Home / Self Care | Attending: Internal Medicine

## 2020-12-07 DIAGNOSIS — Z7901 Long term (current) use of anticoagulants: Secondary | ICD-10-CM | POA: Diagnosis not present

## 2020-12-07 DIAGNOSIS — G4733 Obstructive sleep apnea (adult) (pediatric): Secondary | ICD-10-CM | POA: Insufficient documentation

## 2020-12-07 DIAGNOSIS — I10 Essential (primary) hypertension: Secondary | ICD-10-CM | POA: Insufficient documentation

## 2020-12-07 DIAGNOSIS — E669 Obesity, unspecified: Secondary | ICD-10-CM | POA: Insufficient documentation

## 2020-12-07 DIAGNOSIS — I080 Rheumatic disorders of both mitral and aortic valves: Secondary | ICD-10-CM | POA: Insufficient documentation

## 2020-12-07 DIAGNOSIS — G4730 Sleep apnea, unspecified: Secondary | ICD-10-CM | POA: Diagnosis not present

## 2020-12-07 DIAGNOSIS — Z95818 Presence of other cardiac implants and grafts: Secondary | ICD-10-CM | POA: Diagnosis not present

## 2020-12-07 DIAGNOSIS — Z7982 Long term (current) use of aspirin: Secondary | ICD-10-CM | POA: Diagnosis not present

## 2020-12-07 DIAGNOSIS — I480 Paroxysmal atrial fibrillation: Secondary | ICD-10-CM

## 2020-12-07 DIAGNOSIS — E785 Hyperlipidemia, unspecified: Secondary | ICD-10-CM | POA: Insufficient documentation

## 2020-12-07 DIAGNOSIS — I083 Combined rheumatic disorders of mitral, aortic and tricuspid valves: Secondary | ICD-10-CM | POA: Diagnosis not present

## 2020-12-07 DIAGNOSIS — I340 Nonrheumatic mitral (valve) insufficiency: Secondary | ICD-10-CM

## 2020-12-07 DIAGNOSIS — Z888 Allergy status to other drugs, medicaments and biological substances status: Secondary | ICD-10-CM | POA: Diagnosis not present

## 2020-12-07 DIAGNOSIS — Z6830 Body mass index (BMI) 30.0-30.9, adult: Secondary | ICD-10-CM | POA: Insufficient documentation

## 2020-12-07 DIAGNOSIS — Z79899 Other long term (current) drug therapy: Secondary | ICD-10-CM | POA: Diagnosis not present

## 2020-12-07 HISTORY — PX: TEE WITHOUT CARDIOVERSION: SHX5443

## 2020-12-07 SURGERY — ECHOCARDIOGRAM, TRANSESOPHAGEAL
Anesthesia: Monitor Anesthesia Care

## 2020-12-07 MED ORDER — SODIUM CHLORIDE 0.9 % IV SOLN: INTRAVENOUS | Status: DC

## 2020-12-07 MED ORDER — PROPOFOL 500 MG/50ML IV EMUL
INTRAVENOUS | Status: DC | PRN
  Administered 2020-12-07: 150 ug/kg/min via INTRAVENOUS

## 2020-12-07 MED ORDER — BUTAMBEN-TETRACAINE-BENZOCAINE 2-2-14 % EX AERO
INHALATION_SPRAY | CUTANEOUS | Status: DC | PRN
  Administered 2020-12-07: 2 via TOPICAL

## 2020-12-07 MED ORDER — LIDOCAINE 2% (20 MG/ML) 5 ML SYRINGE
INTRAMUSCULAR | Status: DC | PRN
  Administered 2020-12-07: 40 mg via INTRAVENOUS

## 2020-12-07 NOTE — Progress Notes (Signed)
Echocardiogram Echocardiogram Transesophageal has been performed.  Oneal Deputy Leahna Hewson RDCS 12/07/2020, 10:02 AM

## 2020-12-07 NOTE — Transfer of Care (Signed)
Immediate Anesthesia Transfer of Care Note  Patient: Zachary Montoya  Procedure(s) Performed: TRANSESOPHAGEAL ECHOCARDIOGRAM (TEE)  Patient Location: PACU  Anesthesia Type:MAC  Level of Consciousness: drowsy and patient cooperative  Airway & Oxygen Therapy: Patient Spontanous Breathing and Patient connected to nasal cannula oxygen  Post-op Assessment: Report given to RN  Post vital signs: Reviewed and stable  Last Vitals:  Vitals Value Taken Time  BP 127/70   Temp    Pulse 50   Resp 12 12/07/20 0950  SpO2 97   Vitals shown include unvalidated device data.  Last Pain:  Vitals:   12/07/20 0820  TempSrc: Temporal  PainSc: 0-No pain         Complications: No notable events documented.

## 2020-12-07 NOTE — Interval H&P Note (Signed)
History and Physical Interval Note:  12/07/2020 9:00 AM  Zachary Montoya  has presented today for surgery, with the diagnosis of POST WATCHMAN IMPLANT.  The various methods of treatment have been discussed with the patient and family. After consideration of risks, benefits and other options for treatment, the patient has consented to  Procedure(s): TRANSESOPHAGEAL ECHOCARDIOGRAM (TEE) (N/A) as a surgical intervention.  The patient's history has been reviewed, patient examined, no change in status, stable for surgery.  I have reviewed the patient's chart and labs.  Questions were answered to the patient's satisfaction.     Elouise Munroe

## 2020-12-07 NOTE — Anesthesia Preprocedure Evaluation (Signed)
Anesthesia Evaluation  Patient identified by MRN, date of birth, ID band Patient awake    Reviewed: Allergy & Precautions, NPO status , Patient's Chart, lab work & pertinent test results  History of Anesthesia Complications Negative for: history of anesthetic complications  Airway Mallampati: III  TM Distance: >3 FB Neck ROM: Full    Dental  (+) Dental Advisory Given, Teeth Intact   Pulmonary neg shortness of breath, sleep apnea , neg COPD, neg recent URI,    breath sounds clear to auscultation       Cardiovascular hypertension, Pt. on medications and Pt. on home beta blockers (-) angina+ dysrhythmias Atrial Fibrillation  Rhythm:Regular  1. Left ventricular ejection fraction, by estimation, is 60 to 65%. The  left ventricle has normal function. The left ventricle has no regional  wall motion abnormalities.  2. Right ventricular systolic function is normal. The right ventricular  size is normal.  3. Left atrial appendage windsock morphology with average ostial diameter  of 21 mm. A 27 mm Watchman FLX device is implanted, is stable and well  seated with 22% compression and no peripheral leak. 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  Left atrial size was mildly dilated. No left atrial/left atrial appendage  thrombus was detected.  4. The mitral valve is normal in structure. Trivial mitral valve  regurgitation. No evidence of mitral stenosis.  5. The aortic valve is tricuspid. Aortic valve regurgitation is trivial.  No aortic stenosis is present.  6. The inferior vena cava is normal in size with greater than 50%  respiratory variability, suggesting right atrial pressure of 3 mmHg.   S/p watchman   Neuro/Psych negative neurological ROS  negative psych ROS   GI/Hepatic negative GI ROS, Neg liver ROS,   Endo/Other  negative  endocrine ROS  Renal/GU negative Renal ROS     Musculoskeletal negative musculoskeletal ROS (+)   Abdominal   Peds  Hematology negative hematology ROS (+)   Anesthesia Other Findings   Reproductive/Obstetrics                             Anesthesia Physical Anesthesia Plan  ASA: 2  Anesthesia Plan: MAC   Post-op Pain Management:    Induction: Intravenous  PONV Risk Score and Plan: 2 and Propofol infusion and Treatment may vary due to age or medical condition  Airway Management Planned: Nasal Cannula  Additional Equipment: None  Intra-op Plan:   Post-operative Plan:   Informed Consent: I have reviewed the patients History and Physical, chart, labs and discussed the procedure including the risks, benefits and alternatives for the proposed anesthesia with the patient or authorized representative who has indicated his/her understanding and acceptance.     Dental advisory given  Plan Discussed with: CRNA and Anesthesiologist  Anesthesia Plan Comments:         Anesthesia Quick Evaluation

## 2020-12-07 NOTE — CV Procedure (Signed)
INDICATIONS: 45 day post Watchman  PROCEDURE:   Informed consent was obtained prior to the procedure. The risks, benefits and alternatives for the procedure were discussed and the patient comprehended these risks.  Risks include, but are not limited to, cough, sore throat, vomiting, nausea, somnolence, esophageal and stomach trauma or perforation, bleeding, low blood pressure, aspiration, pneumonia, infection, trauma to the teeth and death.    After a procedural time-out, the oropharynx was anesthetized with 20% benzocaine spray.   During this procedure the patient was administered propofol per anesthesia/Julie CRNA.  The patient's heart rate, blood pressure, and oxygen saturation were monitored continuously during the procedure. The period of conscious sedation was 20 minutes, of which I was present face-to-face 100% of this time.  The transesophageal probe was inserted in the esophagus and stomach without difficulty and multiple views were obtained.  The patient was kept under observation until the patient left the procedure room.  The patient left the procedure room in stable condition.   Agitated microbubble saline contrast was not administered.  COMPLICATIONS:    There were no immediate complications.  FINDINGS:   FORMAL ECHOCARDIOGRAM REPORT PENDING Normal appearance of 27 mm Watchman FLX. Well seated with no color flow into the LA appendage.  RECOMMENDATIONS:    Dismissal when alert.  Time Spent Directly with the Patient:  45 minutes   Zachary Montoya 12/07/2020, 9:43 AM

## 2020-12-08 ENCOUNTER — Encounter: Payer: Self-pay | Admitting: Physician Assistant

## 2020-12-08 NOTE — Progress Notes (Addendum)
   Follow-Up Visit   Subjective  Zachary Montoya is a 75 y.o. male who presents for the following: Follow-up (More biopsy's today Left elbow horn like crust and scalp self resolved. ).   The following portions of the chart were reviewed this encounter and updated as appropriate:  Tobacco  Allergies  Meds  Problems  Med Hx  Surg Hx  Fam Hx      Objective  Well appearing patient in no apparent distress; mood and affect are within normal limits.  A focused examination was performed including scalp and upper extremities.. Relevant physical exam findings are noted in the Assessment and Plan.  Left Forearm - Posterior Hyperkeratotic scale with pink base   Assessment & Plan  SCC (squamous cell carcinoma), arm, left Left Forearm - Posterior  Skin / nail biopsy Type of biopsy: tangential   Informed consent: discussed and consent obtained   Timeout: patient name, date of birth, surgical site, and procedure verified   Procedure prep:  Patient was prepped and draped in usual sterile fashion (Non sterile) Prep type:  Chlorhexidine Anesthesia: the lesion was anesthetized in a standard fashion   Anesthetic:  1% lidocaine w/ epinephrine 1-100,000 local infiltration Instrument used: flexible razor blade   Outcome: patient tolerated procedure well   Post-procedure details: wound care instructions given    Destruction of lesion Complexity: simple   Destruction method: electrodesiccation and curettage   Informed consent: discussed and consent obtained   Timeout:  patient name, date of birth, surgical site, and procedure verified Anesthesia: the lesion was anesthetized in a standard fashion   Anesthetic:  1% lidocaine w/ epinephrine 1-100,000 local infiltration Curettage performed in three different directions: Yes   Curettage cycles:  1 Margin per side (cm):  0.1 Final wound size (cm):  1 Hemostasis achieved with:  aluminum chloride Outcome: patient tolerated procedure well with no  complications   Post-procedure details: wound care instructions given    Specimen 1 - Surgical pathology Differential Diagnosis: bcc vs scc- txpbx  Check Margins: No   I, Jazzmyne Rasnick, PA-C, have reviewed all documentation's for this visit.  The documentation on 12/18/20 for the exam, diagnosis, procedures and orders are all accurate and complete.

## 2020-12-10 ENCOUNTER — Encounter (HOSPITAL_COMMUNITY): Payer: Self-pay | Admitting: Internal Medicine

## 2020-12-11 ENCOUNTER — Telehealth: Payer: Self-pay

## 2020-12-11 MED ORDER — PANTOPRAZOLE SODIUM 40 MG PO TBEC: 40.0000 mg | DELAYED_RELEASE_TABLET | Freq: Every day | ORAL | 1 refills | Status: DC

## 2020-12-11 MED ORDER — CLOPIDOGREL BISULFATE 75 MG PO TABS: 75.0000 mg | ORAL_TABLET | Freq: Every day | ORAL | 1 refills | Status: DC

## 2020-12-11 NOTE — Telephone Encounter (Signed)
Per Katie Thompson's 11/29/2020 office note, "4. Plan for Apixaban 5mg  PO BID and Aspirin 81mg  PO daily until the 45 day follow up. If no leak or thrombus at that TEE will transition to Aspirin 81mg  PO daily and Plavix 75mg  PO daily."   Per Dr. Delphina Cahill 12/07/2020 TEE report, "1. 27 mm Watchman FLX left atrial appendage occluder well seated, no residual flow. Left atrial size was mildly dilated. No left atrial/left atrial appendage thrombus was detected."   Per Watchman protocol, instructed patient to STOP ELIQUIS and START PLAVIX 75 mg daily. He will also STOP OMEPRAZOLE and START PANTOPRAZOLE 75 mg daily.  The patient was grateful for call and agrees with plan.

## 2020-12-12 ENCOUNTER — Encounter (HOSPITAL_COMMUNITY): Payer: Self-pay | Admitting: Internal Medicine

## 2020-12-12 NOTE — Anesthesia Postprocedure Evaluation (Signed)
Anesthesia Post Note  Patient: Zachary Montoya  Procedure(s) Performed: TRANSESOPHAGEAL ECHOCARDIOGRAM (TEE)     Patient location during evaluation: Endoscopy Anesthesia Type: MAC Level of consciousness: awake and alert Pain management: pain level controlled Vital Signs Assessment: post-procedure vital signs reviewed and stable Respiratory status: spontaneous breathing, nonlabored ventilation, respiratory function stable and patient connected to nasal cannula oxygen Cardiovascular status: stable and blood pressure returned to baseline Postop Assessment: no apparent nausea or vomiting Anesthetic complications: no   No notable events documented.  Last Vitals:  Vitals:   12/07/20 1036 12/07/20 1050  BP: 128/85 121/74  Pulse: (!) 45 (!) 44  Resp: 11 13  Temp:  36.6 C  SpO2: 99% 96%    Last Pain:  Vitals:   12/07/20 1050  TempSrc:   PainSc: 0-No pain                 Nevaan Bunton

## 2020-12-18 ENCOUNTER — Telehealth: Payer: Self-pay | Admitting: *Deleted

## 2020-12-18 DIAGNOSIS — Z6830 Body mass index (BMI) 30.0-30.9, adult: Secondary | ICD-10-CM | POA: Diagnosis not present

## 2020-12-18 DIAGNOSIS — R7301 Impaired fasting glucose: Secondary | ICD-10-CM | POA: Diagnosis not present

## 2020-12-18 DIAGNOSIS — R011 Cardiac murmur, unspecified: Secondary | ICD-10-CM | POA: Diagnosis not present

## 2020-12-18 DIAGNOSIS — Z0000 Encounter for general adult medical examination without abnormal findings: Secondary | ICD-10-CM | POA: Diagnosis not present

## 2020-12-18 DIAGNOSIS — K219 Gastro-esophageal reflux disease without esophagitis: Secondary | ICD-10-CM | POA: Diagnosis not present

## 2020-12-18 DIAGNOSIS — I480 Paroxysmal atrial fibrillation: Secondary | ICD-10-CM | POA: Diagnosis not present

## 2020-12-18 DIAGNOSIS — H547 Unspecified visual loss: Secondary | ICD-10-CM | POA: Diagnosis not present

## 2020-12-18 DIAGNOSIS — R82998 Other abnormal findings in urine: Secondary | ICD-10-CM | POA: Diagnosis not present

## 2020-12-18 DIAGNOSIS — H9319 Tinnitus, unspecified ear: Secondary | ICD-10-CM | POA: Diagnosis not present

## 2020-12-18 DIAGNOSIS — L570 Actinic keratosis: Secondary | ICD-10-CM | POA: Diagnosis not present

## 2020-12-18 DIAGNOSIS — Z1339 Encounter for screening examination for other mental health and behavioral disorders: Secondary | ICD-10-CM | POA: Diagnosis not present

## 2020-12-18 DIAGNOSIS — Z1331 Encounter for screening for depression: Secondary | ICD-10-CM | POA: Diagnosis not present

## 2020-12-18 DIAGNOSIS — G4733 Obstructive sleep apnea (adult) (pediatric): Secondary | ICD-10-CM | POA: Diagnosis not present

## 2020-12-18 NOTE — Telephone Encounter (Signed)
-----   Message from Warren Danes, [place] sent at 12/18/2020 11:15 AM EDT ----- 30 excise

## 2020-12-18 NOTE — Telephone Encounter (Signed)
Path to patient. Told patient we treated the lesion at the time of biopsy but if lesion recurs please call back for follow up.

## 2020-12-19 ENCOUNTER — Other Ambulatory Visit: Payer: Self-pay | Admitting: Internal Medicine

## 2020-12-19 ENCOUNTER — Ambulatory Visit
Admission: RE | Admit: 2020-12-19 | Discharge: 2020-12-19 | Disposition: A | Discharge status: Home / Self Care | Payer: Medicare HMO | Source: Ambulatory Visit | Attending: Internal Medicine | Admitting: Internal Medicine

## 2020-12-19 DIAGNOSIS — K6389 Other specified diseases of intestine: Secondary | ICD-10-CM | POA: Diagnosis not present

## 2020-12-19 DIAGNOSIS — K5730 Diverticulosis of large intestine without perforation or abscess without bleeding: Secondary | ICD-10-CM | POA: Diagnosis not present

## 2020-12-19 DIAGNOSIS — K5792 Diverticulitis of intestine, part unspecified, without perforation or abscess without bleeding: Secondary | ICD-10-CM

## 2020-12-19 DIAGNOSIS — K429 Umbilical hernia without obstruction or gangrene: Secondary | ICD-10-CM | POA: Diagnosis not present

## 2020-12-19 DIAGNOSIS — I700 Atherosclerosis of aorta: Secondary | ICD-10-CM | POA: Diagnosis not present

## 2020-12-19 MED ORDER — IOPAMIDOL (ISOVUE-300) INJECTION 61%
100.0000 mL | Freq: Once | INTRAVENOUS | Status: AC | PRN
  Administered 2020-12-19: 100 mL via INTRAVENOUS

## 2020-12-20 ENCOUNTER — Other Ambulatory Visit: Payer: Self-pay

## 2020-12-20 ENCOUNTER — Encounter (HOSPITAL_COMMUNITY): Payer: Self-pay | Admitting: Emergency Medicine

## 2020-12-20 ENCOUNTER — Inpatient Hospital Stay (HOSPITAL_COMMUNITY)
Admission: EM | Admit: 2020-12-20 | Discharge: 2020-12-22 | DRG: 391 | Disposition: A | Discharge status: Home / Self Care | Payer: Medicare HMO | Attending: Family Medicine | Admitting: Family Medicine

## 2020-12-20 DIAGNOSIS — K5720 Diverticulitis of large intestine with perforation and abscess without bleeding: Principal | ICD-10-CM | POA: Diagnosis present

## 2020-12-20 DIAGNOSIS — E785 Hyperlipidemia, unspecified: Secondary | ICD-10-CM | POA: Diagnosis present

## 2020-12-20 DIAGNOSIS — I10 Essential (primary) hypertension: Secondary | ICD-10-CM | POA: Diagnosis present

## 2020-12-20 DIAGNOSIS — Z91030 Bee allergy status: Secondary | ICD-10-CM

## 2020-12-20 DIAGNOSIS — Z833 Family history of diabetes mellitus: Secondary | ICD-10-CM

## 2020-12-20 DIAGNOSIS — Z7982 Long term (current) use of aspirin: Secondary | ICD-10-CM

## 2020-12-20 DIAGNOSIS — G4733 Obstructive sleep apnea (adult) (pediatric): Secondary | ICD-10-CM | POA: Diagnosis present

## 2020-12-20 DIAGNOSIS — Z888 Allergy status to other drugs, medicaments and biological substances status: Secondary | ICD-10-CM

## 2020-12-20 DIAGNOSIS — H401113 Primary open-angle glaucoma, right eye, severe stage: Secondary | ICD-10-CM | POA: Diagnosis not present

## 2020-12-20 DIAGNOSIS — K651 Peritoneal abscess: Secondary | ICD-10-CM | POA: Diagnosis present

## 2020-12-20 DIAGNOSIS — K5792 Diverticulitis of intestine, part unspecified, without perforation or abscess without bleeding: Secondary | ICD-10-CM | POA: Diagnosis present

## 2020-12-20 DIAGNOSIS — H401123 Primary open-angle glaucoma, left eye, severe stage: Secondary | ICD-10-CM | POA: Diagnosis not present

## 2020-12-20 DIAGNOSIS — I480 Paroxysmal atrial fibrillation: Secondary | ICD-10-CM | POA: Diagnosis not present

## 2020-12-20 DIAGNOSIS — Z85828 Personal history of other malignant neoplasm of skin: Secondary | ICD-10-CM

## 2020-12-20 DIAGNOSIS — K5780 Diverticulitis of intestine, part unspecified, with perforation and abscess without bleeding: Secondary | ICD-10-CM | POA: Diagnosis present

## 2020-12-20 DIAGNOSIS — Z79899 Other long term (current) drug therapy: Secondary | ICD-10-CM

## 2020-12-20 DIAGNOSIS — Z20822 Contact with and (suspected) exposure to covid-19: Secondary | ICD-10-CM | POA: Diagnosis present

## 2020-12-20 DIAGNOSIS — Z95818 Presence of other cardiac implants and grafts: Secondary | ICD-10-CM | POA: Diagnosis present

## 2020-12-20 DIAGNOSIS — Z95810 Presence of automatic (implantable) cardiac defibrillator: Secondary | ICD-10-CM

## 2020-12-20 DIAGNOSIS — Z8249 Family history of ischemic heart disease and other diseases of the circulatory system: Secondary | ICD-10-CM

## 2020-12-20 DIAGNOSIS — H905 Unspecified sensorineural hearing loss: Secondary | ICD-10-CM | POA: Diagnosis present

## 2020-12-20 DIAGNOSIS — E119 Type 2 diabetes mellitus without complications: Secondary | ICD-10-CM | POA: Diagnosis present

## 2020-12-20 DIAGNOSIS — Z7902 Long term (current) use of antithrombotics/antiplatelets: Secondary | ICD-10-CM

## 2020-12-20 HISTORY — DX: Diverticulitis of intestine, part unspecified, without perforation or abscess without bleeding: K57.92

## 2020-12-20 LAB — COMPREHENSIVE METABOLIC PANEL
ALT: 29 U/L (ref 0–44)
AST: 32 U/L (ref 15–41)
Albumin: 3.6 g/dL (ref 3.5–5.0)
Alkaline Phosphatase: 56 U/L (ref 38–126)
Anion gap: 6 (ref 5–15)
BUN: 10 mg/dL (ref 8–23)
CO2: 27 mmol/L (ref 22–32)
Calcium: 9.3 mg/dL (ref 8.9–10.3)
Chloride: 107 mmol/L (ref 98–111)
Creatinine, Ser: 1.06 mg/dL (ref 0.61–1.24)
GFR, Estimated: >60 mL/min (ref >60)
Glucose, Bld: 108 mg/dL — ABNORMAL HIGH (ref 70–99)
Potassium: 3.8 mmol/L (ref 3.5–5.1)
Sodium: 140 mmol/L (ref 135–145)
Total Bilirubin: 0.7 mg/dL (ref 0.3–1.2)
Total Protein: 6.8 g/dL (ref 6.5–8.1)

## 2020-12-20 LAB — CBC
HCT: 41.5 % (ref 39.0–52.0)
Hemoglobin: 13.8 g/dL (ref 13.0–17.0)
MCH: 32 pg (ref 26.0–34.0)
MCHC: 33.3 g/dL (ref 30.0–36.0)
MCV: 96.3 fL (ref 80.0–100.0)
Platelets: 233 10*3/uL (ref 150–400)
RBC: 4.31 MIL/uL (ref 4.22–5.81)
RDW: 12 % (ref 11.5–15.5)
WBC: 7.1 10*3/uL (ref 4.0–10.5)
nRBC: 0 % (ref 0.0–0.2)

## 2020-12-20 LAB — URINALYSIS, ROUTINE W REFLEX MICROSCOPIC
Bilirubin Urine: NEGATIVE
Glucose, UA: NEGATIVE mg/dL
Hgb urine dipstick: NEGATIVE
Ketones, ur: NEGATIVE mg/dL
Leukocytes,Ua: NEGATIVE
Nitrite: NEGATIVE
Protein, ur: NEGATIVE mg/dL
Specific Gravity, Urine: 1.015 (ref 1.005–1.030)
pH: 6 (ref 5.0–8.0)

## 2020-12-20 LAB — LIPASE, BLOOD: Lipase: 36 U/L (ref 11–51)

## 2020-12-20 NOTE — ED Provider Notes (Signed)
MSE was initiated and I personally evaluated the patient and placed orders (if any) at  10:31 PM on December 20, 2020.   Patient to ED with known colon/diverticular abscess for admission. On arrival, he denies pain, no nausea/vomiting.   Today's Vitals   12/20/20 2023 12/20/20 2120  BP: (!) 155/78   Pulse: 63   Resp: 16   Temp: 99 F (37.2 C)   TempSrc: Oral   SpO2: 98%   Weight: 89.4 kg   Height: 5\' 8"  (1.727 m)   PainSc:  4    Body mass index is 29.95 kg/m.  The patient is well appearing and in NAD.   He has been seen by Dr. Rosendo Gros with [facility] who advises he will need admission via the hospitalist,antibiotics, and surgery will follow.    The patient appears stable so that the remainder of the MSE may be completed by another provider.   Charlann Lange, PA-C 12/20/20 2233    Drenda Freeze, MD 12/20/20 [phone]

## 2020-12-20 NOTE — ED Triage Notes (Addendum)
Pt had dx of diverticulitis last week, had been prescribed Augmentin. Pt say PCP on Monday, had a CT scan on Tuesday that showed a colonic abscess, surgeons told him to get to ED. Pt reports 4/10 LLQ abd pain, denies fevers/chills, N/V. Pt is on plavix for hx afib

## 2020-12-20 NOTE — Consult Note (Signed)
Reason for Consult: Diverticulitis with abscess Referring Physician: Dr. Cristy Friedlander Zachary Montoya is an 75 y.o. male.  HPI: Patient is a 75 year old male who comes in secondary to CT scan with diverticulitis and abscess. Patient states that he began having abdominal pain last Wednesday.  He states that he called his GI doctor and was given Augmentin.  He states that usually this helps and resolution of pain.  He states that pain continued up until Monday when he had a annual physical with Dr. Joylene Draft.  Dr. Joylene Draft was concerned about possible diverticulitis and abscess.  He underwent CT scan.  CT scan was significant for small 1 cm abscess as well as diverticulitis.  Patient was encouraged to come to the ER.  In discussing with the patient he has had no fevers or chills at home currently but did at the beginning of his episode.  He states that he had no nausea vomiting.  He does state he has left lower quadrant abdominal pain.  He does state that the abdominal pain is somewhat better after his antibiotics.  Patient states this is his third/fourth episode of diverticulitis this year.  General surgery was consulted for further evaluation.  Of note the patient is on Plavix and aspirin.  Has A. fib and a ICD.  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/26/2020   Left Posterior Neck   Sleep apnea     Past Surgical History:  Procedure Laterality Date   APPENDECTOMY     LEFT ATRIAL APPENDAGE OCCLUSION N/A 10/26/2020   Procedure: LEFT ATRIAL APPENDAGE OCCLUSION;  Surgeon: Vickie Epley, MD;  Location: [facility];  Service: Cardiovascular;   Laterality: N/A;   NO PAST SURGERIES     TEE WITHOUT CARDIOVERSION N/A 10/26/2020   Procedure: TRANSESOPHAGEAL ECHOCARDIOGRAM (TEE);  Surgeon: Vickie Epley, MD;  Location: [facility];  Service: Cardiovascular;  Laterality: N/A;   TEE WITHOUT CARDIOVERSION N/A 12/07/2020   Procedure: TRANSESOPHAGEAL ECHOCARDIOGRAM (TEE);  Surgeon: Elouise Munroe, MD;  Location: [facility];  Service: Cardiology;  Laterality: N/A;    Family History  Problem Relation Age of Onset   Diabetes Mother    Hypertension Mother    Stroke Mother    Cancer Brother    Heart attack Neg Hx     Social History:  reports that he has never smoked. He has never used smokeless tobacco. He reports current alcohol use of about 1.0 standard drink per week. He reports that he does not use drugs.  Allergies:  Allergies  Allergen Reactions   Timolol     Bradycardia/dizziness    Bee Venom Itching and Swelling   Brimonidine Tartrate Other (See Comments)    Burning, itching, and discomfort     Medications: I have reviewed the patient's current medications.  Results for orders placed or performed during the hospital encounter of 12/20/20 (from the past 48 hour(s))  CBC     Status: None   Collection Time: 12/20/20  9:31 PM  Result Value Ref Range   WBC 7.1 4.0 - 10.5 K/uL   RBC 4.31 4.22 - 5.81 MIL/uL   Hemoglobin 13.8 13.0 - 17.0 g/dL  HCT 41.5 39.0 - 52.0 %   MCV 96.3 80.0 - 100.0 fL   MCH 32.0 26.0 - 34.0 pg   MCHC 33.3 30.0 - 36.0 g/dL   RDW 12.0 11.5 - 15.5 %   Platelets 233 150 - 400 K/uL   nRBC 0.0 0.0 - 0.2 %    Comment: Performed at [facility], [place] [address], [place], [place] [address]    CT ABDOMEN PELVIS W CONTRAST  Result Date: 12/19/2020 CLINICAL DATA:  Left lower quadrant pain for 6 days, history of diverticulitis EXAM: CT ABDOMEN AND PELVIS WITH CONTRAST TECHNIQUE: Multidetector CT imaging of the abdomen and pelvis was performed using the standard protocol following bolus  administration of intravenous contrast. CONTRAST:  183mL ISOVUE-300 IOPAMIDOL (ISOVUE-300) INJECTION 61%, additional oral enteric contrast COMPARISON:  06/06/2020 FINDINGS: Lower chest: No acute abnormality. Watchman type left atrial appendage occlusion device, incompletely imaged. Hepatobiliary: No solid liver abnormality is seen. No gallstones, gallbladder wall thickening, or biliary dilatation. Pancreas: Unremarkable. No pancreatic ductal dilatation or surrounding inflammatory changes. Spleen: Normal in size without significant abnormality. Adrenals/Urinary Tract: Adrenal glands are unremarkable. Kidneys are normal, without renal calculi, solid lesion, or hydronephrosis. Bladder is unremarkable. Stomach/Bowel: Stomach is within normal limits. Appendix is not clearly visualized. Sigmoid diverticulosis with wall thickening and fat stranding about the proximal sigmoid. Suspect a small abscess adjacent to the anterior proximal sigmoid measuring 1.0 x 0.8 cm (series 2, image 68). Vascular/Lymphatic: Aortic atherosclerosis. No enlarged abdominal or pelvic lymph nodes. Reproductive: Prostatomegaly. Other: Fat containing umbilical hernia.  No abdominopelvic ascites. Musculoskeletal: No acute or significant osseous findings. IMPRESSION: 1. Sigmoid diverticulosis with wall thickening and fat stranding about the proximal sigmoid. Suspect a small abscess adjacent to the anterior proximal sigmoid measuring 1.0 cm. Findings are consistent with acute diverticulitis. 2. Prostatomegaly. These results will be called to the ordering clinician or representative by the Radiologist Assistant, and communication documented in the PACS or [employer]. Aortic Atherosclerosis (ICD10-I70.0). Electronically Signed   By: Delanna Ahmadi M.D.   On: 12/19/2020 12:51    Review of Systems  Constitutional:  Negative for chills and fever.  HENT:  Negative for ear discharge, hearing loss and sore throat.   Eyes:  Negative for discharge.   Respiratory:  Negative for cough and shortness of breath.   Cardiovascular:  Negative for chest pain and leg swelling.  Gastrointestinal:  Positive for abdominal pain. Negative for constipation, diarrhea, nausea and vomiting.  Musculoskeletal:  Negative for myalgias and neck pain.  Skin:  Negative for rash.  Allergic/Immunologic: Negative for environmental allergies.  Neurological:  Negative for dizziness and seizures.  Hematological:  Does not bruise/bleed easily.  Psychiatric/Behavioral:  Negative for suicidal ideas.   All other systems reviewed and are negative. Blood pressure (!) 155/78, pulse 63, temperature 99 F (37.2 C), temperature source Oral, resp. rate 16, height 5\' 8"  (1.727 m), weight 89.4 kg, SpO2 98 %. Physical Exam Constitutional:      Appearance: He is well-developed.     Comments: Conversant No acute distress  HENT:     Head: Normocephalic and atraumatic.  Eyes:     General: Lids are normal. No scleral icterus.    Pupils: Pupils are equal, round, and reactive to light.     Comments: Pupils are equal round and reactive No lid lag Moist conjunctiva  Neck:     Thyroid: No thyromegaly.     Trachea: No tracheal tenderness.     Comments: No cervical lymphadenopathy Cardiovascular:  Rate and Rhythm: Normal rate and regular rhythm.     Heart sounds: No murmur heard. Pulmonary:     Effort: Pulmonary effort is normal.     Breath sounds: Normal breath sounds. No wheezing or rales.  Abdominal:     Tenderness: There is abdominal tenderness in the suprapubic area and left lower quadrant. There is no guarding or rebound.     Hernia: No hernia is present.  Musculoskeletal:     Cervical back: Normal range of motion and neck supple.  Skin:    General: Skin is warm.     Findings: No rash.     Nails: There is no clubbing.     Comments: Normal skin turgor  Neurological:     Mental Status: He is alert and oriented to person, place, and time.     Comments: Normal gait  and station  Psychiatric:        Mood and Affect: Mood normal.        Thought Content: Thought content normal.        Judgment: Judgment normal.     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  At this time patient has likely perforated diverticulitis with abscess.  Patient with normal white count.  Patient with minimal abdominal pain.  Would be okay with clears at this time.  Continue with antibiotics.  No plans for urgent surgery. 2.  We will continue to follow along.  Ralene Ok 12/20/2020, 10:28 PM

## 2020-12-21 ENCOUNTER — Encounter (HOSPITAL_COMMUNITY): Payer: Self-pay | Admitting: Internal Medicine

## 2020-12-21 DIAGNOSIS — I480 Paroxysmal atrial fibrillation: Secondary | ICD-10-CM | POA: Diagnosis not present

## 2020-12-21 DIAGNOSIS — K5792 Diverticulitis of intestine, part unspecified, without perforation or abscess without bleeding: Secondary | ICD-10-CM | POA: Diagnosis not present

## 2020-12-21 DIAGNOSIS — Z888 Allergy status to other drugs, medicaments and biological substances status: Secondary | ICD-10-CM | POA: Diagnosis not present

## 2020-12-21 DIAGNOSIS — K5720 Diverticulitis of large intestine with perforation and abscess without bleeding: Principal | ICD-10-CM

## 2020-12-21 DIAGNOSIS — Z79899 Other long term (current) drug therapy: Secondary | ICD-10-CM | POA: Diagnosis not present

## 2020-12-21 DIAGNOSIS — K5780 Diverticulitis of intestine, part unspecified, with perforation and abscess without bleeding: Secondary | ICD-10-CM | POA: Diagnosis present

## 2020-12-21 DIAGNOSIS — H905 Unspecified sensorineural hearing loss: Secondary | ICD-10-CM | POA: Diagnosis not present

## 2020-12-21 DIAGNOSIS — Z833 Family history of diabetes mellitus: Secondary | ICD-10-CM | POA: Diagnosis not present

## 2020-12-21 DIAGNOSIS — K651 Peritoneal abscess: Secondary | ICD-10-CM | POA: Diagnosis not present

## 2020-12-21 DIAGNOSIS — G4733 Obstructive sleep apnea (adult) (pediatric): Secondary | ICD-10-CM | POA: Diagnosis not present

## 2020-12-21 DIAGNOSIS — E785 Hyperlipidemia, unspecified: Secondary | ICD-10-CM | POA: Diagnosis not present

## 2020-12-21 DIAGNOSIS — E119 Type 2 diabetes mellitus without complications: Secondary | ICD-10-CM | POA: Diagnosis not present

## 2020-12-21 DIAGNOSIS — Z91030 Bee allergy status: Secondary | ICD-10-CM | POA: Diagnosis not present

## 2020-12-21 DIAGNOSIS — Z7902 Long term (current) use of antithrombotics/antiplatelets: Secondary | ICD-10-CM | POA: Diagnosis not present

## 2020-12-21 DIAGNOSIS — Z20822 Contact with and (suspected) exposure to covid-19: Secondary | ICD-10-CM | POA: Diagnosis not present

## 2020-12-21 DIAGNOSIS — Z85828 Personal history of other malignant neoplasm of skin: Secondary | ICD-10-CM | POA: Diagnosis not present

## 2020-12-21 DIAGNOSIS — Z7982 Long term (current) use of aspirin: Secondary | ICD-10-CM | POA: Diagnosis not present

## 2020-12-21 DIAGNOSIS — Z8249 Family history of ischemic heart disease and other diseases of the circulatory system: Secondary | ICD-10-CM | POA: Diagnosis not present

## 2020-12-21 DIAGNOSIS — I10 Essential (primary) hypertension: Secondary | ICD-10-CM | POA: Diagnosis not present

## 2020-12-21 DIAGNOSIS — Z95810 Presence of automatic (implantable) cardiac defibrillator: Secondary | ICD-10-CM | POA: Diagnosis not present

## 2020-12-21 LAB — CBC WITH DIFFERENTIAL/PLATELET
Abs Immature Granulocytes: 0.06 10*3/uL (ref 0.00–0.07)
Basophils Absolute: 0.1 10*3/uL (ref 0.0–0.1)
Basophils Relative: 1 %
Eosinophils Absolute: 0.4 10*3/uL (ref 0.0–0.5)
Eosinophils Relative: 5 %
HCT: 44.8 % (ref 39.0–52.0)
Hemoglobin: 14.8 g/dL (ref 13.0–17.0)
Immature Granulocytes: 1 %
Lymphocytes Relative: 20 %
Lymphs Abs: 1.6 10*3/uL (ref 0.7–4.0)
MCH: 31.6 pg (ref 26.0–34.0)
MCHC: 33 g/dL (ref 30.0–36.0)
MCV: 95.7 fL (ref 80.0–100.0)
Monocytes Absolute: 0.6 10*3/uL (ref 0.1–1.0)
Monocytes Relative: 8 %
Neutro Abs: 5.1 10*3/uL (ref 1.7–7.7)
Neutrophils Relative %: 65 %
Platelets: 250 10*3/uL (ref 150–400)
RBC: 4.68 MIL/uL (ref 4.22–5.81)
RDW: 12.1 % (ref 11.5–15.5)
WBC: 7.8 10*3/uL (ref 4.0–10.5)
nRBC: 0 % (ref 0.0–0.2)

## 2020-12-21 LAB — BASIC METABOLIC PANEL
Anion gap: 7 (ref 5–15)
BUN: 10 mg/dL (ref 8–23)
CO2: 26 mmol/L (ref 22–32)
Calcium: 9.4 mg/dL (ref 8.9–10.3)
Chloride: 106 mmol/L (ref 98–111)
Creatinine, Ser: 1.02 mg/dL (ref 0.61–1.24)
GFR, Estimated: >60 mL/min (ref >60)
Glucose, Bld: 110 mg/dL — ABNORMAL HIGH (ref 70–99)
Potassium: 3.4 mmol/L — ABNORMAL LOW (ref 3.5–5.1)
Sodium: 139 mmol/L (ref 135–145)

## 2020-12-21 LAB — LACTIC ACID, PLASMA
Lactic Acid, Venous: 0.8 mmol/L (ref 0.5–1.9)
Lactic Acid, Venous: 1 mmol/L (ref 0.5–1.9)

## 2020-12-21 LAB — RESP PANEL BY RT-PCR (FLU A&B, COVID) ARPGX2
Influenza A by PCR: NEGATIVE
Influenza B by PCR: NEGATIVE
SARS Coronavirus 2 by RT PCR: NEGATIVE

## 2020-12-21 MED ORDER — LACTATED RINGERS IV SOLN: INTRAVENOUS | Status: DC

## 2020-12-21 MED ORDER — FLECAINIDE ACETATE 100 MG PO TABS
100.0000 mg | ORAL_TABLET | Freq: Two times a day (BID) | ORAL | Status: DC
  Administered 2020-12-21 – 2020-12-22 (×3): 100 mg via ORAL
  Filled 2020-12-21 (×4): qty 1

## 2020-12-21 MED ORDER — ACETAMINOPHEN 325 MG PO TABS
650.0000 mg | ORAL_TABLET | Freq: Four times a day (QID) | ORAL | Status: DC | PRN
  Administered 2020-12-21 – 2020-12-22 (×2): 650 mg via ORAL
  Filled 2020-12-21 (×2): qty 2

## 2020-12-21 MED ORDER — HEPARIN SODIUM (PORCINE) 5000 UNIT/ML IJ SOLN
5000.0000 [IU] | Freq: Three times a day (TID) | INTRAMUSCULAR | Status: DC
  Administered 2020-12-21: 5000 [IU] via SUBCUTANEOUS
  Filled 2020-12-21: qty 1

## 2020-12-21 MED ORDER — PANTOPRAZOLE SODIUM 40 MG PO TBEC
40.0000 mg | DELAYED_RELEASE_TABLET | Freq: Every day | ORAL | Status: DC
  Administered 2020-12-21 – 2020-12-22 (×2): 40 mg via ORAL
  Filled 2020-12-21 (×2): qty 1

## 2020-12-21 MED ORDER — LORATADINE 10 MG PO TABS
10.0000 mg | ORAL_TABLET | Freq: Every day | ORAL | Status: DC
  Administered 2020-12-21 – 2020-12-22 (×2): 10 mg via ORAL
  Filled 2020-12-21 (×2): qty 1

## 2020-12-21 MED ORDER — ASPIRIN 81 MG PO CHEW
81.0000 mg | CHEWABLE_TABLET | Freq: Every evening | ORAL | Status: DC
  Administered 2020-12-21: 81 mg via ORAL
  Filled 2020-12-21: qty 1

## 2020-12-21 MED ORDER — SODIUM CHLORIDE 0.9 % IV SOLN: INTRAVENOUS | Status: DC

## 2020-12-21 MED ORDER — METOPROLOL SUCCINATE ER 25 MG PO TB24
25.0000 mg | ORAL_TABLET | Freq: Every evening | ORAL | Status: DC
  Filled 2020-12-21: qty 1

## 2020-12-21 MED ORDER — PIPERACILLIN-TAZOBACTAM 3.375 G IVPB
3.3750 g | Freq: Three times a day (TID) | INTRAVENOUS | Status: DC
  Administered 2020-12-21 – 2020-12-22 (×4): 3.375 g via INTRAVENOUS
  Filled 2020-12-21 (×3): qty 50

## 2020-12-21 MED ORDER — CLOPIDOGREL BISULFATE 75 MG PO TABS
75.0000 mg | ORAL_TABLET | Freq: Every evening | ORAL | Status: DC
  Administered 2020-12-21: 75 mg via ORAL
  Filled 2020-12-21: qty 1

## 2020-12-21 MED ORDER — ACETAMINOPHEN 650 MG RE SUPP: 650.0000 mg | Freq: Four times a day (QID) | RECTAL | Status: DC | PRN

## 2020-12-21 MED ORDER — ATORVASTATIN CALCIUM 10 MG PO TABS
20.0000 mg | ORAL_TABLET | Freq: Every day | ORAL | Status: DC
  Administered 2020-12-21 – 2020-12-22 (×2): 20 mg via ORAL
  Filled 2020-12-21 (×2): qty 2

## 2020-12-21 MED ORDER — OXYCODONE-ACETAMINOPHEN 5-325 MG PO TABS: 1.0000 | ORAL_TABLET | ORAL | Status: DC | PRN

## 2020-12-21 MED ORDER — PIPERACILLIN-TAZOBACTAM 3.375 G IVPB 30 MIN
3.3750 g | Freq: Once | INTRAVENOUS | Status: AC
  Administered 2020-12-21: 3.375 g via INTRAVENOUS
  Filled 2020-12-21: qty 50

## 2020-12-21 MED ORDER — MORPHINE SULFATE (PF) 2 MG/ML IV SOLN
1.0000 mg | INTRAVENOUS | Status: DC | PRN
Start: 2020-12-21 — End: 2020-12-22

## 2020-12-21 NOTE — Progress Notes (Signed)
Pharmacy Antibiotic Note  Zachary Montoya is a 75 y.o. male admitted on 12/20/2020 with  acute diverticulitis with suspected colon abscess .  Pharmacy has been consulted for Zosyn dosing.  Plan: Zosyn 3.375g IV q8h (4 hour infusion).  Height: 5\' 8"  (172.7 cm) Weight: 89.4 kg (197 lb) IBW/kg (Calculated) : 68.4  Temp (24hrs), Avg:99 F (37.2 C), Min:99 F (37.2 C), Max:99 F (37.2 C)  Recent Labs  Lab 12/20/20 2131 12/20/20 2318  WBC 7.1  --   CREATININE 1.06  --   LATICACIDVEN  --  0.8    Estimated Creatinine Clearance: 65.4 mL/min (by C-G formula based on SCr of 1.06 mg/dL).    Allergies  Allergen Reactions   Timolol     Bradycardia/dizziness    Bee Venom Itching and Swelling   Brimonidine Tartrate Other (See Comments)    Burning, itching, and discomfort    Codeine     Other reaction(s): Nausea and vomiting    Thank you for allowing pharmacy to be a part of this patient's care.  Wynona Neat, PharmD, BCPS  12/21/2020 4:13 AM

## 2020-12-21 NOTE — Discharge Summary (Signed)
Patient seen and examined and agree with plan of care according to my partner who admitted this patient this morning  75 year old male with A. fib status post watchman device on aspirin/Plavix, diet-controlled diabetes mellitus Admitted for abdominal pain-has been treated in the outpatient setting with Augmentin x1 week recent office visit-- in the outpatient setting was found to have a small 1 cm abscess in the proximal sigmoid on CT scan  General surgery consulted who recommend surveillance and monitoring given no white count--patient was given clears and they will follow along  Oe BP (!) 148/102 (BP Location: Right Arm)   Pulse (!) 56   Temp 99 F (37.2 C) (Oral)   Resp 19   Ht 5\' 8"  (1.727 m)   Wt 89.4 kg   SpO2 99%   BMI 29.95 kg/m  Pleasant coherent alert no distress does not seem to be in pain S1-S2 no murmur no rub no gallop Chest clear no added sound no rales or rhonchi Abdomen soft no tenderness no rebound no guarding No lower extremity edema ROM intact Power 5/5  P Continue Zosyn every 8, monitor on clear liquids menance per general surgery--pain control morphine 1 mg every 4 as needed for severe pain-we will except for mild pain For A. fib continue flecainide 100 twice daily, Toprol-XL 25 PM as well as aspirin 81 and Plavix 75 as well as fluids 100 cc/H

## 2020-12-21 NOTE — ED Provider Notes (Signed)
[facility] Emergency Department Provider Note MRN:  [id number]  Arrival date & time: 12/21/20     Chief Complaint   Colon Abscess and Abdominal Pain   History of Present Illness   Zachary Montoya is a 75 y.o. year-old male with a history of diverticulitis presenting to the ED with chief complaint of abdominal pain.  Location: Left lower quadrant Duration: 1 week Onset: Gradual Timing: Constant Description: Dull a Severity: Mild Exacerbating/Alleviating Factors: None Associated Symptoms: None Pertinent Negatives: No fever or chills, no chest pain or shortness of breath, no dysuria or hematuria  Additional History: CT scan as outpatient revealing intra-abdominal abscess, sent here for admission.  Review of Systems  A complete 10 system review of systems was obtained and all systems are negative except as noted in the HPI and PMH.   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/26/2020   Left Posterior Neck   Sleep apnea     Past Surgical History:  Procedure Laterality Date   APPENDECTOMY     LEFT ATRIAL APPENDAGE OCCLUSION N/A 10/26/2020   Procedure: LEFT ATRIAL APPENDAGE OCCLUSION;  Surgeon: Vickie Epley, MD;  Location: [facility];  Service: Cardiovascular;  Laterality: N/A;   NO PAST SURGERIES     TEE WITHOUT CARDIOVERSION N/A 10/26/2020   Procedure: TRANSESOPHAGEAL ECHOCARDIOGRAM (TEE);  Surgeon: Vickie Epley, MD;  Location: [facility];  Service: Cardiovascular;  Laterality: N/A;   TEE WITHOUT CARDIOVERSION N/A 12/07/2020   Procedure:  TRANSESOPHAGEAL ECHOCARDIOGRAM (TEE);  Surgeon: Elouise Munroe, MD;  Location: [facility];  Service: Cardiology;  Laterality: N/A;    Family History  Problem Relation Age of Onset   Diabetes Mother    Hypertension Mother    Stroke Mother    Cancer Brother    Heart attack Neg Hx     Social History   Socioeconomic History   Marital status: Married    Spouse name: Not on file   Number of children: Not on file   Years of education: Not on file   Highest education level: Not on file  Occupational History   Not on file  Tobacco Use   Smoking status: Never   Smokeless tobacco: Never  Vaping Use   Vaping Use: Never used  Substance and Sexual Activity   Alcohol use: Yes    Alcohol/week: 1.0 standard drink    Types: 1 Cans of beer per week   Drug use: No   Sexual activity: Not on file  Other Topics Concern   Not on file  Social History Narrative   Not on file   Social Determinants of Health   Financial Resource Strain: Not on file  Food Insecurity: Not on file  Transportation Needs: Not on file  Physical Activity: Not on file  Stress: Not on file  Social Connections: Not on file  Intimate Partner Violence: Not on file     Physical Exam   Vitals:   12/20/20 2023  BP: (!) 155/78  Pulse: 63  Resp: 16  Temp: 99 F (37.2 C)  SpO2: 98%    CONSTITUTIONAL: Well-appearing, NAD NEURO:  Alert and oriented x 3, no focal deficits EYES:  eyes equal and reactive ENT/NECK:  no LAD, no JVD CARDIO: Regular rate, well-perfused, normal S1 and S2 PULM:  CTAB no wheezing or rhonchi GI/GU:  normal bowel sounds, non-distended, mild left lower quadrant tenderness to palpation MSK/SPINE:  No gross deformities, no edema SKIN:  no rash, atraumatic PSYCH:  Appropriate speech and behavior  *Additional and/or pertinent findings included in MDM below  Diagnostic and Interventional Summary    EKG Interpretation  Date/Time:    Ventricular Rate:    PR Interval:    QRS  Duration:   QT Interval:    QTC Calculation:   R Axis:     Text Interpretation:         Labs Reviewed  COMPREHENSIVE METABOLIC PANEL - Abnormal; Notable for the following components:      Result Value   Glucose, Bld 108 (*)    All other components within normal limits  URINALYSIS, ROUTINE W REFLEX MICROSCOPIC - Abnormal; Notable for the following components:   APPearance HAZY (*)    All other components within normal limits  CULTURE, BLOOD (ROUTINE X 2)  CULTURE, BLOOD (ROUTINE X 2)  RESP PANEL BY RT-PCR (FLU A&B, COVID) ARPGX2  LIPASE, BLOOD  CBC  LACTIC ACID, PLASMA  LACTIC ACID, PLASMA    No orders to display    Medications  piperacillin-tazobactam (ZOSYN) IVPB 3.375 g (has no administration in time range)  lactated ringers infusion (has no administration in time range)     Procedures  /  Critical Care .Critical Care Performed by: Maudie Flakes, MD Authorized by: Maudie Flakes, MD   Critical care provider statement:    Critical care time (minutes):  32   Critical care was necessary to treat or prevent imminent or life-threatening deterioration of the following conditions: Intra-abdominal abscess.   Critical care was time spent personally by me on the following activities:  Ordering and performing treatments and interventions, ordering and review of laboratory studies, ordering and review of radiographic studies, review of old charts, examination of patient, discussions with consultants and obtaining history from patient or surrogate  ED Course and Medical Decision Making  I have reviewed the triage vital signs, the nursing notes, and pertinent available records from the EMR.  Listed above are laboratory and imaging tests that I personally ordered, reviewed, and interpreted and then considered in my medical decision making (see below for details).  Diverticulitis diagnosed last week, started on Augmentin but not getting better, CT yesterday revealing abscess.   Here for admission.  Well-appearing nontoxic vital signs overall reassuring, providing Zosyn.  Evaluated by Dr. Rosendo Gros of general surgery, general surgery team will follow in consultation, no need for urgent surgery at this time.  Will admit to medicine.       Barth Kirks. Sedonia Small, MD [place] [email or web address]  Final Clinical Impressions(s) / ED Diagnoses     ICD-10-CM   1. Diverticulitis  K57.92     2. Intra-abdominal abscess ([place])  K65.1       ED Discharge Orders     None        Discharge Instructions Discussed with and Provided to Patient:   Discharge Instructions   None       Maudie Flakes, MD 12/21/20 [phone]

## 2020-12-21 NOTE — ED Notes (Signed)
Breakfast orders placed 

## 2020-12-21 NOTE — Progress Notes (Signed)
Patient ID: Zachary Montoya, male   DOB: 1945-06-17, 75 y.o.   MRN: [id number] [facility] Surgery Progress Note     Subjective: CC-  Comfortable this morning. Denies abdominal pain, n/v. WBC is 7.8, afebrile. He is followed by [facility] and due for screening colonoscopy early next year.  Objective: Vital signs in last 24 hours: Temp:  [99 F (37.2 C)] 99 F (37.2 C) (10/26 2023) Pulse Rate:  [56-63] 58 (10/27 0830) Resp:  [14-19] 15 (10/27 0830) BP: (128-155)/(78-102) 144/82 (10/27 0830) SpO2:  [96 %-99 %] 96 % (10/27 0830) Weight:  [89.4 kg] 89.4 kg (10/26 2023)    Intake/Output from previous day: 10/26 0701 - 10/27 0700 In: 50 [IV Piggyback:50] Out: -  Intake/Output this shift: No intake/output data recorded.  PE: Gen:  Alert, NAD, pleasant HEENT: EOM's intact, pupils equal and round Pulm:  rate and effort normal Abd: Soft, NT/ND, +BS, no HSM, small reducible umbilical hernia Ext:  no BUE/BLE edema, calves soft and nontender Psych: A&Ox3 Skin: no rashes noted, warm and dry  Lab Results:  Recent Labs    12/20/20 2131 12/21/20 0420  WBC 7.1 7.8  HGB 13.8 14.8  HCT 41.5 44.8  PLT 233 250   BMET Recent Labs    12/20/20 2131 12/21/20 0420  NA 140 139  K 3.8 3.4*  CL 107 106  CO2 27 26  GLUCOSE 108* 110*  BUN 10 10  CREATININE 1.06 1.02  CALCIUM 9.3 9.4   PT/INR No results for input(s): LABPROT, INR in the last 72 hours. CMP     Component Value Date/Time   NA 139 12/21/2020 0420   NA 140 11/29/2020 1358   K 3.4 (L) 12/21/2020 0420   CL 106 12/21/2020 0420   CO2 26 12/21/2020 0420   GLUCOSE 110 (H) 12/21/2020 0420   BUN 10 12/21/2020 0420   BUN 15 11/29/2020 1358   CREATININE 1.02 12/21/2020 0420   CALCIUM 9.4 12/21/2020 0420   PROT 6.8 12/20/2020 2131   ALBUMIN 3.6 12/20/2020 2131   AST 32 12/20/2020 2131   ALT 29 12/20/2020 2131   ALKPHOS 56 12/20/2020 2131   BILITOT 0.7 12/20/2020 2131   GFRNONAA >60 12/21/2020 0420   Lipase      Component Value Date/Time   LIPASE 36 12/20/2020 2131       Studies/Results: CT ABDOMEN PELVIS W CONTRAST  Result Date: 12/19/2020 CLINICAL DATA:  Left lower quadrant pain for 6 days, history of diverticulitis EXAM: CT ABDOMEN AND PELVIS WITH CONTRAST TECHNIQUE: Multidetector CT imaging of the abdomen and pelvis was performed using the standard protocol following bolus administration of intravenous contrast. CONTRAST:  162mL ISOVUE-300 IOPAMIDOL (ISOVUE-300) INJECTION 61%, additional oral enteric contrast COMPARISON:  06/06/2020 FINDINGS: Lower chest: No acute abnormality. Watchman type left atrial appendage occlusion device, incompletely imaged. Hepatobiliary: No solid liver abnormality is seen. No gallstones, gallbladder wall thickening, or biliary dilatation. Pancreas: Unremarkable. No pancreatic ductal dilatation or surrounding inflammatory changes. Spleen: Normal in size without significant abnormality. Adrenals/Urinary Tract: Adrenal glands are unremarkable. Kidneys are normal, without renal calculi, solid lesion, or hydronephrosis. Bladder is unremarkable. Stomach/Bowel: Stomach is within normal limits. Appendix is not clearly visualized. Sigmoid diverticulosis with wall thickening and fat stranding about the proximal sigmoid. Suspect a small abscess adjacent to the anterior proximal sigmoid measuring 1.0 x 0.8 cm (series 2, image 68). Vascular/Lymphatic: Aortic atherosclerosis. No enlarged abdominal or pelvic lymph nodes. Reproductive: Prostatomegaly. Other: Fat containing umbilical hernia.  No abdominopelvic ascites. Musculoskeletal:  No acute or significant osseous findings. IMPRESSION: 1. Sigmoid diverticulosis with wall thickening and fat stranding about the proximal sigmoid. Suspect a small abscess adjacent to the anterior proximal sigmoid measuring 1.0 cm. Findings are consistent with acute diverticulitis. 2. Prostatomegaly. These results will be called to the ordering clinician or  representative by the Radiologist Assistant, and communication documented in the PACS or [employer]. Aortic Atherosclerosis (ICD10-I70.0). Electronically Signed   By: Delanna Ahmadi M.D.   On: 12/19/2020 12:51    Anti-infectives: Anti-infectives (From admission, onward)    Start     Dose/Rate Route Frequency Ordered Stop   12/21/20 1000  piperacillin-tazobactam (ZOSYN) IVPB 3.375 g        3.375 g 12.5 mL/hr over 240 Minutes Intravenous Every 8 hours 12/21/20 0414     12/21/20 0300  piperacillin-tazobactam (ZOSYN) IVPB 3.375 g        3.375 g 100 mL/hr over 30 Minutes Intravenous  Once 12/21/20 0248 12/21/20 8469        Assessment/Plan Sigmoid diverticulitis with 1cm abscess - patient has had 3-4 bouts this year, most recently in August - he is due for a colonoscopy early next year with [facility] - abscess is small and would not recommend IR drainage. No indication for surgical intervention - continue IV antibiotics. [place] for soft diet. Consider follow up with one of our colorectal surgeons after colonoscopy to discuss elective colectomy given multiple flare ups of acute diverticulitis, and this episode being a complicated bout. Patient would need medical/cardiac clearance for surgery.   ID - zosyn 10/27>> FEN - soft diet VTE - SCDs, sq heparin Foley - none  A fib s/p Watchman device 10/2020 - on plavix and aspirin, flecainide and metoprolol HTN HLD OSA  DM - diet controlled   LOS: 0 days    Wellington Hampshire, [facility] Surgery 12/21/2020, 9:17 AM Please see Amion for pager number during day hours 7:00am-4:30pm

## 2020-12-21 NOTE — Progress Notes (Signed)
Pt admitted to 6N02 from ED, alert and oriented, with bilateral hearing aides. Oriented to the room and dept.

## 2020-12-21 NOTE — H&P (Addendum)
History and Physical    Zachary Montoya NGE:[id number] DOB: 01/02/1946 DOA: 12/20/2020  PCP: Crist Infante, MD  Patient coming from: Home.  Chief Complaint: Abnormal CT scan.  HPI: Zachary Montoya is a 75 y.o. male with history of A. fib status post recent watchman device placement presently on aspirin and Plavix over the last 1 week has been experiencing left lower quadrant abdominal pain and was on Augmentin for over the last 1 week as prescribed by patient's primary care physician and since patient's pain was mainly more distally towards the left lower quadrant patient's primary care physician ordered a CT scan which showed sigmoid diverticulitis with an abscess formation and was referred to the ER.  Patient states his symptoms started about a week ago with some fever chills and left lower quadrant pain and started taking Augmentin which actually improved his pain but since pain was more than usual when compared to his prior 3 attacks of diverticulitis since February of this year CT scan was ordered.  Denies any chest pain shortness of breath.  Denies any vomiting or diarrhea.  ED Course: In the ER patient was afebrile CBC and complete metabolic panel was unremarkable lactic acid was normal.  COVID test is pending.  On-call general surgeon was consulted and plan is to keep patient on IV antibiotics clear liquids and admitted for further observation.  Review of Systems: As per HPI, rest all negative.   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/26/2020   Left Posterior Neck   Sleep apnea     Past Surgical History:  Procedure Laterality  Date   APPENDECTOMY     LEFT ATRIAL APPENDAGE OCCLUSION N/A 10/26/2020   Procedure: LEFT ATRIAL APPENDAGE OCCLUSION;  Surgeon: Vickie Epley, MD;  Location: [facility];  Service: Cardiovascular;  Laterality: N/A;   NO PAST SURGERIES     TEE WITHOUT CARDIOVERSION N/A 10/26/2020   Procedure: TRANSESOPHAGEAL ECHOCARDIOGRAM (TEE);  Surgeon: Vickie Epley, MD;  Location: [facility];  Service: Cardiovascular;  Laterality: N/A;   TEE WITHOUT CARDIOVERSION N/A 12/07/2020   Procedure: TRANSESOPHAGEAL ECHOCARDIOGRAM (TEE);  Surgeon: Elouise Munroe, MD;  Location: [facility];  Service: Cardiology;  Laterality: N/A;     reports that he has never smoked. He has never used smokeless tobacco. He reports current alcohol use of about 1.0 standard drink per week. He reports that he does not use drugs.  Allergies  Allergen Reactions   Timolol     Bradycardia/dizziness    Bee Venom Itching and Swelling   Brimonidine Tartrate Other (See Comments)    Burning, itching, and discomfort    Codeine     Other reaction(s): Nausea and vomiting    Family History  Problem Relation Age of Onset   Diabetes Mother    Hypertension Mother    Stroke Mother    Cancer Brother    Heart attack Neg Hx     Prior to Admission medications   Medication Sig Start Date End Date Taking? Authorizing Provider  acetaminophen (TYLENOL) 650 MG CR tablet Take 650-1,300 mg by mouth every 8 (eight) hours as needed for pain.   Yes  [provider]  amoxicillin-clavulanate (AUGMENTIN) 875-125 MG tablet Take 1 tablet by mouth See admin instructions. Bid x  days 12/14/20  Yes [provider]  aspirin 81 MG chewable tablet Chew 1 tablet (81 mg total) by mouth daily. Patient taking differently: Chew 81 mg by mouth every evening. 10/27/20  Yes Eileen Stanford, PA-C  atorvastatin (LIPITOR) 20 MG tablet Take 20 mg by mouth daily. 05/11/14  Yes [provider]  B Complex-C (B-COMPLEX WITH  VITAMIN C) tablet Take 1 tablet by mouth daily.   Yes [provider]  Calcium Carbonate-Vitamin D (CALCIUM-D PO) Take 1 tablet by mouth daily. 600 mg calcium + 800 iu vit. d   Yes [provider]  cetirizine (ZYRTEC) 10 MG tablet Take 10 mg by mouth daily.   Yes [provider]  cholecalciferol (VITAMIN D) 25 MCG (1000 UNIT) tablet Take 3,000 Units by mouth daily.   Yes [provider]  clopidogrel (PLAVIX) 75 MG tablet Take 1 tablet (75 mg total) by mouth daily. Patient taking differently: Take 75 mg by mouth every evening. 12/11/20 06/09/21 Yes Vickie Epley, MD  Coenzyme Q10 (COQ10) 200 MG CAPS Take 200 mg by mouth daily.   Yes [provider]  ferrous sulfate 325 (65 FE) MG tablet Take 325 mg by mouth daily.   Yes [provider]  flecainide (TAMBOCOR) 100 MG tablet Take 1 tablet (100 mg total) by mouth 2 (two) times daily. 07/27/20  Yes Camnitz, Ocie Doyne, MD  metoprolol succinate (TOPROL-XL) 25 MG 24 hr tablet Take 1 tablet (25 mg total) by mouth daily. Patient taking differently: Take 25 mg by mouth every evening. 07/27/20  Yes Camnitz, Will Hassell Done, MD  Multiple Vitamins-Minerals (CENTRUM ADULTS PO) Take 1 tablet by mouth daily.   Yes [provider]  Omega 3 1000 MG CAPS Take 1,000 mg by mouth daily.   Yes [provider]  pantoprazole (PROTONIX) 40 MG tablet Take 1 tablet (40 mg total) by mouth daily. 12/11/20 06/09/21 Yes Vickie Epley, MD  triamcinolone (NASACORT) 55 MCG/ACT AERO nasal inhaler Place 1 spray into the nose daily as needed (allergies).   Yes [provider]  influenza vaccine adjuvanted (FLUAD QUADRIVALENT) 0.5 ML injection Inject into the muscle. 11/20/20       Physical Exam: Constitutional: Moderately built and nourished. Vitals:   12/20/20 2023 12/21/20 0325  BP: (!) 155/78 (!) 148/102  Pulse: 63 (!) 56  Resp: 16 19  Temp: 99 F (37.2 C)   TempSrc: Oral   SpO2: 98% 99%   Weight: 89.4 kg   Height: 5\' 8"  (1.727 m)    Eyes: Anicteric no pallor. ENMT: No discharge from the ears eyes nose and mouth. Neck: No mass felt.  No neck rigidity. Respiratory: No rhonchi or crepitations. Cardiovascular: S1-S2 heard. Abdomen: Mild tenderness in the left lower quadrant no guarding rigidity or rebound tenderness. Musculoskeletal: No edema. Skin: No rash. Neurologic: Alert awake oriented to time place and person.  Moves all extremities. Psychiatric: Appears normal.  Normal affect.   Labs on Admission: I have personally reviewed following labs and imaging studies  CBC: Recent Labs  Lab 12/20/20 2131  WBC 7.1  HGB 13.8  HCT 41.5  MCV 96.3  PLT 389   Basic Metabolic Panel: Recent Labs  Lab 12/20/20 2131  NA 140  K 3.8  CL 107  CO2 27  GLUCOSE 108*  BUN 10  CREATININE 1.06  CALCIUM 9.3   GFR: Estimated  Creatinine Clearance: 65.4 mL/min (by C-G formula based on SCr of 1.06 mg/dL). Liver Function Tests: Recent Labs  Lab 12/20/20 2131  AST 32  ALT 29  ALKPHOS 56  BILITOT 0.7  PROT 6.8  ALBUMIN 3.6   Recent Labs  Lab 12/20/20 2131  LIPASE 36   No results for input(s): AMMONIA in the last 168 hours. Coagulation Profile: No results for input(s): INR, PROTIME in the last 168 hours. Cardiac Enzymes: No results for input(s): CKTOTAL, CKMB, CKMBINDEX, TROPONINI in the last 168 hours. BNP (last 3 results) No results for input(s): PROBNP in the last 8760 hours. HbA1C: No results for input(s): HGBA1C in the last 72 hours. CBG: No results for input(s): GLUCAP in the last 168 hours. Lipid Profile: No results for input(s): CHOL, HDL, LDLCALC, TRIG, CHOLHDL, LDLDIRECT in the last 72 hours. Thyroid Function Tests: No results for input(s): TSH, T4TOTAL, FREET4, T3FREE, THYROIDAB in the last 72 hours. Anemia Panel: No results for input(s): VITAMINB12, FOLATE, FERRITIN, TIBC, IRON, RETICCTPCT in the last 72 hours. Urine analysis:    Component Value  Date/Time   COLORURINE YELLOW 12/20/2020 2124   APPEARANCEUR HAZY (A) 12/20/2020 2124   LABSPEC 1.015 12/20/2020 2124   PHURINE 6.0 12/20/2020 2124   GLUCOSEU NEGATIVE 12/20/2020 2124   HGBUR NEGATIVE 12/20/2020 2124   [place] 12/20/2020 2124   [place] 12/20/2020 2124   PROTEINUR NEGATIVE 12/20/2020 2124   NITRITE NEGATIVE 12/20/2020 2124   LEUKOCYTESUR NEGATIVE 12/20/2020 2124   Sepsis Labs: @LABRCNTIP (procalcitonin:4,lacticidven:4) )No results found for this or any previous visit (from the past 240 hour(s)).   Radiological Exams on Admission: CT ABDOMEN PELVIS W CONTRAST  Result Date: 12/19/2020 CLINICAL DATA:  Left lower quadrant pain for 6 days, history of diverticulitis EXAM: CT ABDOMEN AND PELVIS WITH CONTRAST TECHNIQUE: Multidetector CT imaging of the abdomen and pelvis was performed using the standard protocol following bolus administration of intravenous contrast. CONTRAST:  165mL ISOVUE-300 IOPAMIDOL (ISOVUE-300) INJECTION 61%, additional oral enteric contrast COMPARISON:  06/06/2020 FINDINGS: Lower chest: No acute abnormality. Watchman type left atrial appendage occlusion device, incompletely imaged. Hepatobiliary: No solid liver abnormality is seen. No gallstones, gallbladder wall thickening, or biliary dilatation. Pancreas: Unremarkable. No pancreatic ductal dilatation or surrounding inflammatory changes. Spleen: Normal in size without significant abnormality. Adrenals/Urinary Tract: Adrenal glands are unremarkable. Kidneys are normal, without renal calculi, solid lesion, or hydronephrosis. Bladder is unremarkable. Stomach/Bowel: Stomach is within normal limits. Appendix is not clearly visualized. Sigmoid diverticulosis with wall thickening and fat stranding about the proximal sigmoid. Suspect a small abscess adjacent to the anterior proximal sigmoid measuring 1.0 x 0.8 cm (series 2, image 68). Vascular/Lymphatic: Aortic atherosclerosis. No enlarged abdominal  or pelvic lymph nodes. Reproductive: Prostatomegaly. Other: Fat containing umbilical hernia.  No abdominopelvic ascites. Musculoskeletal: No acute or significant osseous findings. IMPRESSION: 1. Sigmoid diverticulosis with wall thickening and fat stranding about the proximal sigmoid. Suspect a small abscess adjacent to the anterior proximal sigmoid measuring 1.0 cm. Findings are consistent with acute diverticulitis. 2. Prostatomegaly. These results will be called to the ordering clinician or representative by the Radiologist Assistant, and communication documented in the PACS or [employer]. Aortic Atherosclerosis (ICD10-I70.0). Electronically Signed   By: Delanna Ahmadi M.D.   On: 12/19/2020 12:51     Assessment/Plan Principal Problem:   Diverticulitis Active Problems:   Paroxysmal atrial fibrillation (HCC)   Essential hypertension   Presence of Watchman left atrial appendage closure device   Diverticulitis of intestine with abscess    Acute  sigmoid diverticulitis with abscess formation -appreciate general surgery consult and recommendation.  Patient will be on clear liquid diet Zosyn IV fluids pain medications.  We will follow further recommendations per general surgery. A. fib status post recent watchman device placement on October 26, 2020 presently off anticoagulation and has been placed on aspirin and Plavix over the last 1 week.  On flecainide and metoprolol. Hypertension on metoprolol. History of sleep apnea on CPAP. History of diet-controlled diabetes mellitus type 2 presently not on any medication.  Follow metabolic panel closely.  COVID test is pending.   Since patient has acute sigmoid diverticulitis with abscess formation will need close monitoring for any further worsening and inpatient status.   DVT prophylaxis: Heparin. Code Status: Full code. Family Communication: Discussed with patient. Disposition Plan: Home. Consults called: General surgery. Admission status:  Inpatient.   Rise Patience MD [facility] Pager [phone].  If 7PM-7AM, please contact night-coverage [email or web address] Password [email or web address]  12/21/2020, 4:04 AM

## 2020-12-21 NOTE — Plan of Care (Signed)
  Problem: Activity: Goal: Risk for activity intolerance will decrease Outcome: Progressing   Problem: Nutrition: Goal: Adequate nutrition will be maintained Outcome: Progressing   Problem: Elimination: Goal: Will not experience complications related to urinary retention Outcome: Progressing   

## 2020-12-22 DIAGNOSIS — K5792 Diverticulitis of intestine, part unspecified, without perforation or abscess without bleeding: Secondary | ICD-10-CM | POA: Diagnosis not present

## 2020-12-22 LAB — COMPREHENSIVE METABOLIC PANEL
ALT: 24 U/L (ref 0–44)
AST: 25 U/L (ref 15–41)
Albumin: 3.3 g/dL — ABNORMAL LOW (ref 3.5–5.0)
Alkaline Phosphatase: 53 U/L (ref 38–126)
Anion gap: 5 (ref 5–15)
BUN: 11 mg/dL (ref 8–23)
CO2: 24 mmol/L (ref 22–32)
Calcium: 8.7 mg/dL — ABNORMAL LOW (ref 8.9–10.3)
Chloride: 110 mmol/L (ref 98–111)
Creatinine, Ser: 1.05 mg/dL (ref 0.61–1.24)
GFR, Estimated: >60 mL/min (ref >60)
Glucose, Bld: 114 mg/dL — ABNORMAL HIGH (ref 70–99)
Potassium: 3.7 mmol/L (ref 3.5–5.1)
Sodium: 139 mmol/L (ref 135–145)
Total Bilirubin: 0.6 mg/dL (ref 0.3–1.2)
Total Protein: 6.5 g/dL (ref 6.5–8.1)

## 2020-12-22 NOTE — Progress Notes (Signed)
Patient ID: Zachary Montoya, male   DOB: 01-Oct-1945, 75 y.o.   MRN: [id number] [facility] Surgery Progress Note     Subjective: CC-  Comfortable this morning. Denies abdominal pain, n/v. afebrile. +Flatus, feels like he needs to have a BM. Last BM was after CT scan with PO contrast.  Objective: Vital signs in last 24 hours: Temp:  [97.9 F (36.6 C)-98.7 F (37.1 C)] 98.3 F (36.8 C) (10/28 0355) Pulse Rate:  [51-58] 55 (10/28 0355) Resp:  [14-20] 20 (10/28 0355) BP: (104-144)/(63-84) 144/73 (10/28 0355) SpO2:  [93 %-98 %] 97 % (10/28 0355) Last BM Date: 12/19/20  Intake/Output from previous day: 10/27 0701 - 10/28 0700 In: 1702.1 [P.O.:420; I.V.:1147; IV Piggyback:135] Out: 6962 [Urine:3175] Intake/Output this shift: No intake/output data recorded.  PE: Gen:  Alert, NAD, pleasant HEENT: EOM's intact, pupils equal and round Pulm:  rate and effort normal Abd: Soft, protuberant, NT/ND, +BS, no HSM Ext:  no BUE/BLE edema, calves soft and nontender Psych: A&Ox3 Skin: no rashes noted, warm and dry  Lab Results:  Recent Labs    12/20/20 2131 12/21/20 0420  WBC 7.1 7.8  HGB 13.8 14.8  HCT 41.5 44.8  PLT 233 250   BMET Recent Labs    12/21/20 0420 12/22/20 0316  NA 139 139  K 3.4* 3.7  CL 106 110  CO2 26 24  GLUCOSE 110* 114*  BUN 10 11  CREATININE 1.02 1.05  CALCIUM 9.4 8.7*   PT/INR No results for input(s): LABPROT, INR in the last 72 hours. CMP     Component Value Date/Time   NA 139 12/22/2020 0316   NA 140 11/29/2020 1358   K 3.7 12/22/2020 0316   CL 110 12/22/2020 0316   CO2 24 12/22/2020 0316   GLUCOSE 114 (H) 12/22/2020 0316   BUN 11 12/22/2020 0316   BUN 15 11/29/2020 1358   CREATININE 1.05 12/22/2020 0316   CALCIUM 8.7 (L) 12/22/2020 0316   PROT 6.5 12/22/2020 0316   ALBUMIN 3.3 (L) 12/22/2020 0316   AST 25 12/22/2020 0316   ALT 24 12/22/2020 0316   ALKPHOS 53 12/22/2020 0316   BILITOT 0.6 12/22/2020 0316   GFRNONAA >60  12/22/2020 0316   Lipase     Component Value Date/Time   LIPASE 36 12/20/2020 2131       Studies/Results: No results found.  Anti-infectives: Anti-infectives (From admission, onward)    Start     Dose/Rate Route Frequency Ordered Stop   12/21/20 1000  piperacillin-tazobactam (ZOSYN) IVPB 3.375 g        3.375 g 12.5 mL/hr over 240 Minutes Intravenous Every 8 hours 12/21/20 0414     12/21/20 0300  piperacillin-tazobactam (ZOSYN) IVPB 3.375 g        3.375 g 100 mL/hr over 30 Minutes Intravenous  Once 12/21/20 0248 12/21/20 9528        Assessment/Plan Sigmoid diverticulitis with 1cm abscess - patient has had 3-4 bouts this year, most recently in August - he is due for a colonoscopy early next year with [facility] - abscess is small and would not recommend IR drainage. No indication for surgical intervention - afebrile, leukocytosis resolved, pain improving, tolerating soft diet. Ok to transition to 14 days PO abx from [facility] perspective. Outpatient follow up for colonoscopy and then he can make an appointment with our colorectal surgeons to discuss elective sigmoid colectomy.  ID - zosyn 10/27>> FEN - soft diet VTE - SCDs, sq heparin Foley - none  A fib s/p Watchman device 10/2020 - on plavix and aspirin, flecainide and metoprolol HTN HLD OSA  DM - diet controlled   LOS: 1 day    Jill Alexanders, [facility] 12/22/2020, 10:34 AM Please see Amion for pager number during day hours 7:00am-4:30pm

## 2020-12-22 NOTE — Progress Notes (Signed)
Zachary Montoya to be D/C'd  per MD order.  Discussed with the patient and all questions fully answered.  VSS, Skin clean, dry and intact without evidence of skin break down, no evidence of skin tears noted.  IV catheter discontinued intact. Site without signs and symptoms of complications. Dressing and pressure applied.  An After Visit Summary was printed and given to the patient.  Patient instructed to return to ED, call 911, or call MD for any changes in condition.   Patient to be escorted via [name], and D/C home via private auto.

## 2020-12-22 NOTE — Progress Notes (Signed)
Mobility Specialist Progress Note:   12/22/20 1055  Mobility  Activity Ambulated in hall  Level of Assistance Independent  Assistive Device Other (Comment) (IV Pole)  Distance Ambulated (ft) 570 ft  Mobility Ambulated independently in hallway  Mobility Response Tolerated well  Mobility performed by Mobility specialist  $Mobility charge 1 Mobility   Pt asx during ambulation. Walks to BR independently. Eager for d/c.  Nelta Numbers Mobility Specialist  Phone [phone]

## 2020-12-22 NOTE — Discharge Summary (Signed)
Physician Discharge Summary  Zachary Montoya RFF:[id number] DOB: 1945-03-10 DOA: 12/20/2020  PCP: Crist Infante, MD  Admit date: 12/20/2020 Discharge date: 12/22/2020  Time spent: 26 minutes  Recommendations for Outpatient Follow-up:  Complete antibiotics [he still has current prescription and a backup prescription that he will complete 10 initial 14 days of antibiotics are taken ]and will need outpatient interval colonoscopy and referral to colorectal surgeon Recommend outpatient Chem-12 CBC magnesium in about 1 week  Discharge Diagnoses:  MAIN problem for hospitalization   Diverticulitis with small diverticular abscess  Please see below for itemized issues addressed in [place]- refer to other progress notes for clarity if needed  Discharge Condition: Good  Diet recommendation: Heart healthy  Filed Weights   12/20/20 2023  Weight: 89.4 kg    History of present illness:  75 year old male with A. fib status post watchman device on aspirin/Plavix, diet-controlled diabetes mellitus Admitted for abdominal pain-has been treated in the outpatient setting with Augmentin x1 week recent office visit-- in the outpatient setting was found to have a small 1 cm abscess in the proximal sigmoid on CT scan   General surgery consulted who recommend surveillance and monitoring given no white count-he was initially placed on IV fluids and Zosyn in addition to pain control and kept on his usual home meds-patient was given clears and graduated rapidly to soft diet and ultimately was felt to be stable for discharge Patient tells me he has his original prescription of Augmentin which she can continue and also has a backup prescription-I told him to complete a total of 14 days of antibiotics stop date on 01/03/2021  He was eating drinking passing stool and was comfortable on the day of discharge and was cleared by surgery to go home with close outpatient follow-up as dictated above no other changes to  meds from prior to admission    Discharge Exam: Vitals:   12/21/20 2342 12/22/20 0355  BP: (!) 144/70 (!) 144/73  Pulse: (!) 52 (!) 55  Resp: 17 20  Temp: 97.9 F (36.6 C) 98.3 F (36.8 C)  SpO2: 98% 97%    Subj on day of d/c   Awake coherent pleasant no distress ambulatory  General Exam on discharge  EOMI NCAT no focal deficit CTA B no added sound no rales no rhonchi Abdomen soft nontender nondistended no rebound no guarding No lower extremity edema ROM intact moving all 4 limbs equally power 5/5 no focal deficit  Discharge Instructions   Discharge Instructions     Diet - low sodium heart healthy   Complete by: As directed    Discharge instructions   Complete by: As directed    Please complete 14 days of antibiotics ending on 01/03/2021-for pain you can use Tylenol as you are not requiring Percocet or anything stronger-if you have high fever chills nausea vomiting please make sure that you call your primary doctor and you may need to present yourself to the hospital   Increase activity slowly   Complete by: As directed       Allergies as of 12/22/2020       Reactions   Timolol    Bradycardia/dizziness   Bee Venom Itching, Swelling   Brimonidine Tartrate Other (See Comments)   Burning, itching, and discomfort    Codeine    Other reaction(s): Nausea and vomiting        Medication List     TAKE these medications    acetaminophen 650 MG CR tablet Commonly known as: TYLENOL  Take 650-1,300 mg by mouth every 8 (eight) hours as needed for pain.   amoxicillin-clavulanate 875-125 MG tablet Commonly known as: AUGMENTIN Take 1 tablet by mouth See admin instructions. Bid x  days   aspirin 81 MG chewable tablet Chew 1 tablet (81 mg total) by mouth daily. What changed: when to take this   atorvastatin 20 MG tablet Commonly known as: LIPITOR Take 20 mg by mouth daily.   B-complex with vitamin C tablet Take 1 tablet by mouth daily.   CALCIUM-D PO Take  1 tablet by mouth daily. 600 mg calcium + 800 iu vit. d   CENTRUM ADULTS PO Take 1 tablet by mouth daily.   cetirizine 10 MG tablet Commonly known as: ZYRTEC Take 10 mg by mouth daily.   cholecalciferol 25 MCG (1000 UNIT) tablet Commonly known as: VITAMIN D Take 3,000 Units by mouth daily.   clopidogrel 75 MG tablet Commonly known as: PLAVIX Take 1 tablet (75 mg total) by mouth daily. What changed: when to take this   CoQ10 200 MG Caps Take 200 mg by mouth daily.   ferrous sulfate 325 (65 FE) MG tablet Take 325 mg by mouth daily.   flecainide 100 MG tablet Commonly known as: TAMBOCOR Take 1 tablet (100 mg total) by mouth 2 (two) times daily.   Fluad Quadrivalent 0.5 ML injection Generic drug: influenza vaccine adjuvanted Inject into the muscle.   metoprolol succinate 25 MG 24 hr tablet Commonly known as: TOPROL-XL Take 1 tablet (25 mg total) by mouth daily. What changed: when to take this   Omega 3 1000 MG Caps Take 1,000 mg by mouth daily.   pantoprazole 40 MG tablet Commonly known as: PROTONIX Take 1 tablet (40 mg total) by mouth daily.   triamcinolone 55 MCG/ACT Aero nasal inhaler Commonly known as: NASACORT Place 1 spray into the nose daily as needed (allergies).       Allergies  Allergen Reactions   Timolol     Bradycardia/dizziness    Bee Venom Itching and Swelling   Brimonidine Tartrate Other (See Comments)    Burning, itching, and discomfort    Codeine     Other reaction(s): Nausea and vomiting    Follow-up Information     [facility].   Specialty: General Surgery Why: call and make an appointment with a one of our colorectal surgeons after your colonoscopy. Contact information: [address] [address] [phone]                  The results of significant diagnostics from this hospitalization (including imaging, microbiology, ancillary and laboratory) are listed below for  reference.    Significant Diagnostic Studies: CT ABDOMEN PELVIS W CONTRAST  Result Date: 12/19/2020 CLINICAL DATA:  Left lower quadrant pain for 6 days, history of diverticulitis EXAM: CT ABDOMEN AND PELVIS WITH CONTRAST TECHNIQUE: Multidetector CT imaging of the abdomen and pelvis was performed using the standard protocol following bolus administration of intravenous contrast. CONTRAST:  129mL ISOVUE-300 IOPAMIDOL (ISOVUE-300) INJECTION 61%, additional oral enteric contrast COMPARISON:  06/06/2020 FINDINGS: Lower chest: No acute abnormality. Watchman type left atrial appendage occlusion device, incompletely imaged. Hepatobiliary: No solid liver abnormality is seen. No gallstones, gallbladder wall thickening, or biliary dilatation. Pancreas: Unremarkable. No pancreatic ductal dilatation or surrounding inflammatory changes. Spleen: Normal in size without significant abnormality. Adrenals/Urinary Tract: Adrenal glands are unremarkable. Kidneys are normal, without renal calculi, solid lesion, or hydronephrosis. Bladder is unremarkable. Stomach/Bowel: Stomach is  within normal limits. Appendix is not clearly visualized. Sigmoid diverticulosis with wall thickening and fat stranding about the proximal sigmoid. Suspect a small abscess adjacent to the anterior proximal sigmoid measuring 1.0 x 0.8 cm (series 2, image 68). Vascular/Lymphatic: Aortic atherosclerosis. No enlarged abdominal or pelvic lymph nodes. Reproductive: Prostatomegaly. Other: Fat containing umbilical hernia.  No abdominopelvic ascites. Musculoskeletal: No acute or significant osseous findings. IMPRESSION: 1. Sigmoid diverticulosis with wall thickening and fat stranding about the proximal sigmoid. Suspect a small abscess adjacent to the anterior proximal sigmoid measuring 1.0 cm. Findings are consistent with acute diverticulitis. 2. Prostatomegaly. These results will be called to the ordering clinician or representative by the Radiologist Assistant,  and communication documented in the PACS or [employer]. Aortic Atherosclerosis (ICD10-I70.0). Electronically Signed   By: Delanna Ahmadi M.D.   On: 12/19/2020 12:51   ECHO TEE  Result Date: 12/09/2020    TRANSESOPHOGEAL ECHO REPORT   Patient Name:   KAYTON DUNAJ Date of Exam: 12/07/2020 Medical Rec #:  [id number]         Height:       68.0 in Accession #:    [id number]        Weight:       202.0 lb Date of Birth:  12-31-1945         BSA:          2.052 m Patient Age:    46 years          BP:           173/96 mmHg Patient Gender: M                 HR:           55 bpm. Exam Location:  Inpatient Procedure: Transesophageal Echo, 3D Echo, Color Doppler and Cardiac Doppler Indications:     I48.91* Unspecified atrial fibrillation  History:         Patient has prior history of Echocardiogram examinations, most                  recent 10/26/2020. Arrythmias:Atrial Fibrillation; Risk                  Factors:Hypertension and Dyslipidemia. 64mm Watchman placed                  10/26/20.  Sonographer:     Raquel Sarna Senior RDCS Referring Phys:  [id number] Vickie Epley Diagnosing Phys: Cherlynn Kaiser MD PROCEDURE: After discussion of the risks and benefits of a TEE, an informed consent was obtained from the patient. TEE procedure time was 15 minutes. The transesophogeal probe was passed without difficulty through the esophogus of the patient. Imaged were obtained with the patient in a left lateral decubitus position. Local oropharyngeal anesthetic was provided with Cetacaine. Sedation performed by different physician. The patient was monitored while under deep sedation. Anesthestetic sedation was provided intravenously by Anesthesiology: 268mg  of Propofol, 40mg  of Lidocaine. Image quality was good. The patient's vital signs; including heart rate, blood pressure, and oxygen saturation; remained stable throughout the procedure. The patient developed no complications during the procedure. IMPRESSIONS  1. 27 mm Watchman FLX  left atrial appendage occluder well seated, no residual flow. Left atrial size was mildly dilated. No left atrial/left atrial appendage thrombus was detected.  2. Left ventricular ejection fraction, by estimation, is 60 to 65%. The left ventricle has normal function.  3. Right ventricular systolic function is normal. The right ventricular size is  normal.  4. The mitral valve is grossly normal. Mild mitral valve regurgitation.  5. The aortic valve is normal in structure. Aortic valve regurgitation is trivial. FINDINGS  Left Ventricle: Left ventricular ejection fraction, by estimation, is 60 to 65%. The left ventricle has normal function. The left ventricular internal cavity size was normal in size. Right Ventricle: The right ventricular size is normal. No increase in right ventricular wall thickness. Right ventricular systolic function is normal. Left Atrium: 27 mm Watchman FLX left atrial appendage occluder well seated, no residual flow. Left atrial size was mildly dilated. No left atrial/left atrial appendage thrombus was detected. Right Atrium: Right atrial size was normal in size. Pericardium: There is no evidence of pericardial effusion. Mitral Valve: The mitral valve is grossly normal. Mild mitral valve regurgitation. Tricuspid Valve: The tricuspid valve is normal in structure. Tricuspid valve regurgitation is trivial. No evidence of tricuspid stenosis. Aortic Valve: The aortic valve is normal in structure. Aortic valve regurgitation is trivial. Pulmonic Valve: The pulmonic valve was normal in structure. Pulmonic valve regurgitation is trivial. Aorta: The aortic root and ascending aorta are structurally normal, with no evidence of dilitation. IAS/Shunts: No atrial level shunt detected by color flow Doppler. Cherlynn Kaiser MD Electronically signed by Cherlynn Kaiser MD Signature Date/Time: 12/09/2020/9:24:13 PM    Final     Microbiology: Recent Results (from the past 240 hour(s))  Blood culture (routine x  2)     Status: None (Preliminary result)   Collection Time: 12/20/20 11:18 PM   Specimen: BLOOD  Result Value Ref Range Status   Specimen Description BLOOD LEFT ANTECUBITAL  Final   Special Requests   Final    BOTTLES DRAWN AEROBIC AND ANAEROBIC Blood Culture adequate volume   Culture   Final    NO GROWTH < 12 HOURS Performed at [facility], [place] [address], [place], [place] [address]    Report Status PENDING  Incomplete  Blood culture (routine x 2)     Status: None (Preliminary result)   Collection Time: 12/20/20 11:18 PM   Specimen: BLOOD  Result Value Ref Range Status   Specimen Description BLOOD RIGHT ANTECUBITAL  Final   Special Requests AEROBIC BOTTLE ONLY Blood Culture adequate volume  Final   Culture   Final    NO GROWTH < 12 HOURS Performed at [facility], [place] [address], [place], [place] [address]    Report Status PENDING  Incomplete  Resp Panel by RT-PCR (Flu A&B, Covid) Nasopharyngeal Swab     Status: None   Collection Time: 12/21/20  2:49 AM   Specimen: Nasopharyngeal Swab; Nasopharyngeal(NP) swabs in vial transport medium  Result Value Ref Range Status   SARS Coronavirus 2 by RT PCR NEGATIVE NEGATIVE Final    Comment: (NOTE) SARS-CoV-2 target nucleic acids are NOT DETECTED.  The SARS-CoV-2 RNA is generally detectable in upper respiratory specimens during the acute phase of infection. The lowest concentration of SARS-CoV-2 viral copies this assay can detect is 138 copies/mL. A negative result does not preclude SARS-Cov-2 infection and should not be used as the sole basis for treatment or other patient management decisions. A negative result may occur with  improper specimen collection/handling, submission of specimen other than nasopharyngeal swab, presence of viral mutation(s) within the areas targeted by this assay, and inadequate number of viral copies(<138 copies/mL). A negative result must be combined with clinical observations, patient history,  and epidemiological information. The expected result is Negative.  Fact Sheet for Patients:  EntrepreneurPulse.com.au  Fact Sheet for Healthcare Providers:  IncredibleEmployment.be  This test is no t yet approved or cleared by the [place] FDA and  has been authorized for detection and/or diagnosis of SARS-CoV-2 by FDA under an Emergency Use Authorization (EUA). This EUA will remain  in effect (meaning this test can be used) for the duration of the COVID-19 declaration under Section 564(b)(1) of the Act, 21 U.S.C.section 360bbb-3(b)(1), unless the authorization is terminated  or revoked sooner.       Influenza A by PCR NEGATIVE NEGATIVE Final   Influenza B by PCR NEGATIVE NEGATIVE Final    Comment: (NOTE) The Xpert Xpress SARS-CoV-2/FLU/RSV plus assay is intended as an aid in the diagnosis of influenza from Nasopharyngeal swab specimens and should not be used as a sole basis for treatment. Nasal washings and aspirates are unacceptable for Xpert Xpress SARS-CoV-2/FLU/RSV testing.  Fact Sheet for Patients: EntrepreneurPulse.com.au  Fact Sheet for Healthcare Providers: IncredibleEmployment.be  This test is not yet approved or cleared by the [place] FDA and has been authorized for detection and/or diagnosis of SARS-CoV-2 by FDA under an Emergency Use Authorization (EUA). This EUA will remain in effect (meaning this test can be used) for the duration of the COVID-19 declaration under Section 564(b)(1) of the Act, 21 U.S.C. section 360bbb-3(b)(1), unless the authorization is terminated or revoked.  Performed at [facility], [place] [address], [place], [place] [address]      Labs: Basic Metabolic Panel: Recent Labs  Lab 12/20/20 2131 12/21/20 0420 12/22/20 0316  NA 140 139 139  K 3.8 3.4* 3.7  CL 107 106 110  CO2 27 26 24   GLUCOSE 108* 110* 114*  BUN 10 10 11   CREATININE 1.06 1.02  1.05  CALCIUM 9.3 9.4 8.7*   Liver Function Tests: Recent Labs  Lab 12/20/20 2131 12/22/20 0316  AST 32 25  ALT 29 24  ALKPHOS 56 53  BILITOT 0.7 0.6  PROT 6.8 6.5  ALBUMIN 3.6 3.3*   Recent Labs  Lab 12/20/20 2131  LIPASE 36   No results for input(s): AMMONIA in the last 168 hours. CBC: Recent Labs  Lab 12/20/20 2131 12/21/20 0420  WBC 7.1 7.8  NEUTROABS  --  5.1  HGB 13.8 14.8  HCT 41.5 44.8  MCV 96.3 95.7  PLT 233 250   Cardiac Enzymes: No results for input(s): CKTOTAL, CKMB, CKMBINDEX, TROPONINI in the last 168 hours. BNP: BNP (last 3 results) No results for input(s): BNP in the last 8760 hours.  ProBNP (last 3 results) No results for input(s): PROBNP in the last 8760 hours.  CBG: No results for input(s): GLUCAP in the last 168 hours.     Signed:  Nita Sells MD   [facility] 12/22/2020, 9:56 AM

## 2020-12-25 LAB — CULTURE, BLOOD (ROUTINE X 2)
Culture: NO GROWTH
Culture: NO GROWTH
Special Requests: ADEQUATE
Special Requests: ADEQUATE

## 2020-12-27 DIAGNOSIS — K5732 Diverticulitis of large intestine without perforation or abscess without bleeding: Secondary | ICD-10-CM | POA: Diagnosis not present

## 2020-12-27 DIAGNOSIS — Z86010 Personal history of colonic polyps: Secondary | ICD-10-CM | POA: Diagnosis not present

## 2020-12-27 DIAGNOSIS — K5792 Diverticulitis of intestine, part unspecified, without perforation or abscess without bleeding: Secondary | ICD-10-CM | POA: Diagnosis not present

## 2020-12-27 DIAGNOSIS — K5901 Slow transit constipation: Secondary | ICD-10-CM | POA: Diagnosis not present

## 2020-12-29 ENCOUNTER — Ambulatory Visit: Payer: Medicare HMO | Attending: Internal Medicine

## 2020-12-29 ENCOUNTER — Other Ambulatory Visit (HOSPITAL_BASED_OUTPATIENT_CLINIC_OR_DEPARTMENT_OTHER): Payer: Self-pay

## 2020-12-29 ENCOUNTER — Other Ambulatory Visit: Payer: Self-pay

## 2020-12-29 DIAGNOSIS — Z23 Encounter for immunization: Secondary | ICD-10-CM

## 2020-12-29 MED ORDER — PFIZER COVID-19 VAC BIVALENT 30 MCG/0.3ML IM SUSP
INTRAMUSCULAR | 0 refills | Status: AC
  Filled 2020-12-29: qty 0.3, 1d supply, fill #0

## 2020-12-29 NOTE — Progress Notes (Signed)
   Covid-19 Vaccination Clinic  Name:  Zachary Montoya    MRN: [id number] DOB: 08/26/1945  12/29/2020  Mr. Haag was observed post Covid-19 immunization for 15 minutes without incident. He was provided with Vaccine Information Sheet and instruction to access the V-Safe system.   Mr. Fye was instructed to call 911 with any severe reactions post vaccine: Difficulty breathing  Swelling of face and throat  A fast heartbeat  A bad rash all over body  Dizziness and weakness   Immunizations Administered     Name Date Dose VIS Date Route   Pfizer Covid-19 Vaccine Bivalent Booster 12/29/2020  3:23 PM 0.3 mL 10/25/2020 Intramuscular   Manufacturer: Pablo Pena   Lot: [id number]   [place]: [phone]

## 2021-01-01 DIAGNOSIS — H60332 Swimmer's ear, left ear: Secondary | ICD-10-CM | POA: Diagnosis not present

## 2021-01-04 DIAGNOSIS — H60332 Swimmer's ear, left ear: Secondary | ICD-10-CM | POA: Diagnosis not present

## 2021-01-05 DIAGNOSIS — K5790 Diverticulosis of intestine, part unspecified, without perforation or abscess without bleeding: Secondary | ICD-10-CM | POA: Diagnosis not present

## 2021-01-05 DIAGNOSIS — K5792 Diverticulitis of intestine, part unspecified, without perforation or abscess without bleeding: Secondary | ICD-10-CM | POA: Diagnosis not present

## 2021-01-05 DIAGNOSIS — I480 Paroxysmal atrial fibrillation: Secondary | ICD-10-CM | POA: Diagnosis not present

## 2021-01-05 DIAGNOSIS — K219 Gastro-esophageal reflux disease without esophagitis: Secondary | ICD-10-CM | POA: Diagnosis not present

## 2021-01-11 DIAGNOSIS — G4733 Obstructive sleep apnea (adult) (pediatric): Secondary | ICD-10-CM | POA: Diagnosis not present

## 2021-01-11 DIAGNOSIS — Z9989 Dependence on other enabling machines and devices: Secondary | ICD-10-CM | POA: Diagnosis not present

## 2021-02-05 ENCOUNTER — Other Ambulatory Visit: Payer: Self-pay

## 2021-02-05 MED ORDER — METOPROLOL SUCCINATE ER 25 MG PO TB24: 25.0000 mg | ORAL_TABLET | Freq: Every day | ORAL | 1 refills | Status: DC

## 2021-02-06 ENCOUNTER — Ambulatory Visit: Payer: Self-pay | Admitting: Surgery

## 2021-02-06 ENCOUNTER — Encounter: Payer: Self-pay | Admitting: Surgery

## 2021-02-06 DIAGNOSIS — Z95818 Presence of other cardiac implants and grafts: Secondary | ICD-10-CM | POA: Diagnosis not present

## 2021-02-06 DIAGNOSIS — Z86010 Personal history of colonic polyps: Secondary | ICD-10-CM | POA: Diagnosis not present

## 2021-02-06 DIAGNOSIS — K5720 Diverticulitis of large intestine with perforation and abscess without bleeding: Secondary | ICD-10-CM | POA: Diagnosis not present

## 2021-02-06 DIAGNOSIS — R739 Hyperglycemia, unspecified: Secondary | ICD-10-CM

## 2021-02-06 DIAGNOSIS — Z7901 Long term (current) use of anticoagulants: Secondary | ICD-10-CM | POA: Diagnosis not present

## 2021-02-06 DIAGNOSIS — K432 Incisional hernia without obstruction or gangrene: Secondary | ICD-10-CM | POA: Diagnosis not present

## 2021-02-06 DIAGNOSIS — I480 Paroxysmal atrial fibrillation: Secondary | ICD-10-CM | POA: Diagnosis not present

## 2021-02-06 DIAGNOSIS — K5730 Diverticulosis of large intestine without perforation or abscess without bleeding: Secondary | ICD-10-CM | POA: Insufficient documentation

## 2021-02-06 DIAGNOSIS — G4733 Obstructive sleep apnea (adult) (pediatric): Secondary | ICD-10-CM | POA: Diagnosis not present

## 2021-02-07 ENCOUNTER — Telehealth: Payer: Self-pay | Admitting: *Deleted

## 2021-02-07 DIAGNOSIS — H903 Sensorineural hearing loss, bilateral: Secondary | ICD-10-CM | POA: Diagnosis not present

## 2021-02-07 NOTE — Telephone Encounter (Signed)
° °  Patient Name: Zachary Montoya  DOB: 07-19-1945 MRN: [id number]  Primary Cardiologist: None  listed but Primary EP - Dr. Curt Bears; also recently followed by Dr. Quentin Ore and Dr. Burt Knack  Chart reviewed as part of pre-operative protocol coverage. As below, needs robotic resection of colon. Had recent Watchman procedure 10/26/20. Per Joellen Jersey Thompson's note 11/29/20, " He was discharged on Eliquis 5mg  BID and ASA 81 mg for 45 days with plans to transition to DAPT with ASA/Plavix if 45 day TEE showed an adequate seal. This has been set up for 12/07/20 with Dr. Margaretann Loveless." Per TEE result note, "Per Watchman protocol, instructed patient to STOP ELIQUIS and START PLAVIX 75 mg daily." Surgeon had requested to hold Plavix. Our preop CMA is also clarifying whether they need to hold ASA as well. Unclear to me if he is able to interrupt anticoagulation yet given recent Watchman.  I will route to structural APP as primary point of contact to advise on surgical clearance. Katie - - Please route response to P CV DIV PREOP (the pre-op pool). Thank you.  Charlie Pitter, PA-C 02/07/2021, 3:20 PM

## 2021-02-07 NOTE — Telephone Encounter (Signed)
Heard back from [place], [place] @ [facility], pt can stay on Aspirin but will need to hold Plavix.

## 2021-02-07 NOTE — Telephone Encounter (Signed)
I am okay with him temporarily holding both aspirin and plavix for his color resection. Resume at the discretion of the surgeon. He will be seen back by [facility] in Feb and will we transitioned to monotherapy with aspirin

## 2021-02-07 NOTE — Telephone Encounter (Signed)
° °  Name: Dayvian Blixt  DOB: Feb 11, 1946  MRN: [id number]   Primary Cardiologist: None listed but Primary EP - Dr. Curt Bears; also recently followed by Dr. Quentin Ore and Dr. Burt Knack  Chart reviewed as part of pre-operative protocol coverage. Patient was contacted 02/07/2021 in reference to pre-operative risk assessment for pending surgery as outlined below. As below, needs robotic resection of colon. Has history of atrial fibrillation. Had recent Watchman procedure 10/26/20. Per Joellen Jersey Thompson's note 11/29/20, " He was discharged on Eliquis 5mg  BID and ASA 81 mg for 45 days with plans to transition to DAPT with ASA/Plavix if 45 day TEE showed an adequate seal. He underwent his TEE 12/07/20 and was transitioned from Eliquis to Plavix. EF normal on TEE. No known history of occlusive CAD. RCRI 0.4% indicating low CV risk with surgery. I reached out to patient for update on how he is doing. The patient affirms he has been doing well without any new cardiac symptoms. Able to exert >4 METS without cardiac symptoms. He has to go up and down 16 steps 8-10x a day and does so without any CP or SOB. Therefore, based on ACC/AHA guidelines, the patient would be at acceptable risk for the planned procedure without further cardiovascular testing. The patient was advised that if he develops new symptoms prior to surgery to contact our office to arrange for a follow-up visit, and he verbalized understanding.  I have reached out to our structural team about blood thinner therapy. Per Nell Range patient may hold Plavix as requested for surgery. We generally recommend holding 5-7 days but will defer to surgeon for final determination of duration. We typically advise that blood thinners be resumed when felt safe by performing physician. Surgeon's office has clarified that he can remain on aspirin.  I will route this recommendation to the requesting party via Epic fax function and remove from pre-op pool. Please call with  questions.  Charlie Pitter, PA-C 02/07/2021, 4:12 PM

## 2021-02-07 NOTE — Telephone Encounter (Signed)
SURGEON IS MARKED AS URGENT:      Pre-operative Risk Assessment    Patient Name: Zachary Montoya  DOB: [birth year] MRN: [id number]      Request for Surgical Clearance    Procedure:   ROBOTIC RESECTION OF COLON   Date of Surgery:  Clearance TBD            MARKED AS URGENT                     Surgeon:  DR. Marilynn Rail Group or Practice Name:  [facility] Phone number:   [phone] Fax number:   [phone]   Type of Clearance Requested:   - Medical  - Pharmacy:  Hold Clopidogrel (Plavix) NOT INDICATED ON CLEARANCE, BUT PT ALSO ON ASPIRIN   I have left a message for the surgeon's office to see if need to hold Aspirin as well.   Type of Anesthesia:  General    Additional requests/questions:      Signed, Jeanann Lewandowsky   02/07/2021, 2:48 PM

## 2021-02-13 DIAGNOSIS — Z8669 Personal history of other diseases of the nervous system and sense organs: Secondary | ICD-10-CM | POA: Insufficient documentation

## 2021-02-13 DIAGNOSIS — H938X3 Other specified disorders of ear, bilateral: Secondary | ICD-10-CM | POA: Diagnosis not present

## 2021-02-14 ENCOUNTER — Other Ambulatory Visit: Payer: Self-pay | Admitting: Gastroenterology

## 2021-02-23 ENCOUNTER — Other Ambulatory Visit: Payer: Self-pay

## 2021-02-23 DIAGNOSIS — I480 Paroxysmal atrial fibrillation: Secondary | ICD-10-CM | POA: Diagnosis not present

## 2021-02-23 DIAGNOSIS — E781 Pure hyperglyceridemia: Secondary | ICD-10-CM | POA: Diagnosis not present

## 2021-02-23 DIAGNOSIS — M542 Cervicalgia: Secondary | ICD-10-CM | POA: Diagnosis not present

## 2021-02-23 MED ORDER — FLECAINIDE ACETATE 100 MG PO TABS: 100.0000 mg | ORAL_TABLET | Freq: Two times a day (BID) | ORAL | 1 refills | Status: DC

## 2021-03-12 NOTE — Patient Instructions (Signed)
DUE TO COVID-19 ONLY ONE VISITOR IS ALLOWED TO COME WITH YOU AND STAY IN THE WAITING ROOM ONLY DURING PRE OP AND PROCEDURE.   **NO VISITORS ARE ALLOWED IN THE SHORT STAY AREA OR RECOVERY ROOM!!**  IF YOU WILL BE ADMITTED INTO THE HOSPITAL YOU ARE ALLOWED ONLY TWO SUPPORT PEOPLE DURING VISITATION HOURS ONLY (7 AM -8PM)   The support person(s) must pass our screening, gel in and out, and wear a mask at all times, including in the patients room. Patients must also wear a mask when staff or their support person are in the room. Visitors GUEST BADGE MUST BE WORN VISIBLY  One adult visitor may remain with you overnight and MUST be in the room by 8 P.M.  No visitors under the age of 56. Any visitor under the age of 73 must be accompanied by an adult.    COVID SWAB TESTING MUST BE COMPLETED ON:  03/26/21 @ 12:00 PM   Site: [facility] [place] Enter: Main Entrance have a seat in the waiting area to the right of main entrance (DO NOT Happy!!!!!) Dial: [phone] to alert staff you have arrived  You are not required to quarantine, however you are required to wear a well-fitted mask when you are out and around people not in your household.  Hand Hygiene often Do NOT share personal items Notify your provider if you are in close contact with someone who has COVID or you develop fever 100.4 or greater, new onset of sneezing, cough, sore throat, shortness of breath or body aches.  [place], [address], must go inside of the hospital, NOT A DRIVE THRU!  (Must self quarantine after testing. Follow instructions on handout.)       Your procedure is scheduled on: 03/28/21   Report to [facility] Main Entrance    Report to admitting at : 11:30 AM   Call this number if you have problems the morning of surgery [phone]  CLEAR LIQUID DIET; STARTING THE DAY BEFORE SURGERY UNTIL:  10:45 am. DRINK PLENTY FLUIDS THE DAY OF THE PREP.  Foods Allowed                                                                     Foods Excluded  Water, Black Coffee and tea, regular and decaf                             liquids that you cannot  Plain Jell-O in any flavor  (No red)                                           see through such as: Fruit ices (not with fruit pulp)                                     milk, soups, orange juice  Iced Popsicles (No red)                                    All solid food                                   Apple juices Sports drinks like Gatorade (No red) Lightly seasoned clear broth or consume(fat free) Sugar Sample Menu Breakfast                                Lunch                                     Supper Cranberry juice                    Beef broth                            Chicken broth Jell-O                                     Grape juice                           Apple juice Coffee or tea                        Jell-O                                      Popsicle                                                Coffee or tea                        Coffee or tea   DRINK 2 PRESURGERY ENSURE DRINKS THE NIGHT BEFORE SURGERY AT  1000 PM AND 1 PRESURGERY DRINK THE DAY OF THE PROCEDURE 3 HOURS PRIOR TO SCHEDULED SURGERY. NO SOLIDS AFTER MIDNIGHT THE DAY PRIOR TO THE SURGERY. NOTHING BY MOUTH EXCEPT CLEAR LIQUIDS UNTIL THREE HOURS PRIOR TO SCHEDULED SURGERY. PLEASE FINISH PRESURGERY ENSURE DRINK PER SURGEON ORDER 3 HOURS PRIOR TO SCHEDULED SURGERY TIME WHICH NEEDS TO BE COMPLETED AT : 10:45 AM.    The day of surgery:  Drink ONE (1) Pre-Surgery Clear Ensure or G2 at AM the morning of surgery. Drink in one sitting. Do not sip.  This drink was given to you during your hospital  pre-op appointment visit. Nothing else to drink after completing the  Pre-Surgery Clear Ensure or G2.          If you have questions, please contact your  surgeons office.     Oral Hygiene is also important to reduce your risk of infection.  Remember - BRUSH YOUR TEETH THE MORNING OF SURGERY WITH YOUR REGULAR TOOTHPASTE   Do NOT smoke after Midnight   Take these medicines the morning of surgery with A SIP OF WATER: flecainide,cetirizine,metoprolol,pantoprazole.  DO NOT TAKE ANY ORAL DIABETIC MEDICATIONS DAY OF YOUR SURGERY                              You may not have any metal on your body including hair pins, jewelry, and body piercing             Do not wear  lotions, powders, perfumes/cologne, or deodorant              Men may shave face and neck.   Do not bring valuables to the hospital. [place].   Contacts, dentures or bridgework may not be worn into surgery.   Bring small overnight bag day of surgery.    Patients discharged on the day of surgery will not be allowed to drive home.  Someone needs to stay with you for the first 24 hours after anesthesia.   Special Instructions: Bring a copy of your healthcare power of attorney and living will documents         the day of surgery if you haven't scanned them before.              Please read over the following fact sheets you were given: IF YOU HAVE QUESTIONS ABOUT YOUR PRE-OP INSTRUCTIONS PLEASE CALL [phone]     [facility] - Preparing for Surgery Before surgery, you can play an important role.  Because skin is not sterile, your skin needs to be as free of germs as possible.  You can reduce the number of germs on your skin by washing with CHG (chlorahexidine gluconate) soap before surgery.  CHG is an antiseptic cleaner which kills germs and bonds with the skin to continue killing germs even after washing. Please DO NOT use if you have an allergy to CHG or antibacterial soaps.  If your skin becomes reddened/irritated stop using the CHG and inform your nurse when you arrive at Short Stay. Do not  shave (including legs and underarms) for at least 48 hours prior to the first CHG shower.  You may shave your face/neck. Please follow these instructions carefully:  1.  Shower with CHG Soap the night before surgery and the  morning of Surgery.  2.  If you choose to wash your hair, wash your hair first as usual with your  normal  shampoo.  3.  After you shampoo, rinse your hair and body thoroughly to remove the  shampoo.                           4.  Use CHG as you would any other liquid soap.  You can apply chg directly  to the skin and wash                       Gently with a scrungie or clean washcloth.  5.  Apply the CHG Soap to your body ONLY FROM THE NECK DOWN.   Do not use on face/ open  Wound or open sores. Avoid contact with eyes, ears mouth and genitals (private parts).                       Wash face,  Genitals (private parts) with your normal soap.             6.  Wash thoroughly, paying special attention to the area where your surgery  will be performed.  7.  Thoroughly rinse your body with warm water from the neck down.  8.  DO NOT shower/wash with your normal soap after using and rinsing off  the CHG Soap.                9.  Pat yourself dry with a clean towel.            10.  Wear clean pajamas.            11.  Place clean sheets on your bed the night of your first shower and do not  sleep with pets. Day of Surgery : Do not apply any lotions/deodorants the morning of surgery.  Please wear clean clothes to the hospital/surgery center.  FAILURE TO FOLLOW THESE INSTRUCTIONS MAY RESULT IN THE CANCELLATION OF YOUR SURGERY PATIENT SIGNATURE_________________________________  NURSE SIGNATURE__________________________________  ________________________________________________________________________   Adam Phenix  An incentive spirometer is a tool that can help keep your lungs clear and active. This tool measures how well you are filling your lungs  with each breath. Taking long deep breaths may help reverse or decrease the chance of developing breathing (pulmonary) problems (especially infection) following: A long period of time when you are unable to move or be active. BEFORE THE PROCEDURE  If the spirometer includes an indicator to show your best effort, your nurse or respiratory therapist will set it to a desired goal. If possible, sit up straight or lean slightly forward. Try not to slouch. Hold the incentive spirometer in an upright position. INSTRUCTIONS FOR USE  Sit on the edge of your bed if possible, or sit up as far as you can in bed or on a chair. Hold the incentive spirometer in an upright position. Breathe out normally. Place the mouthpiece in your mouth and seal your lips tightly around it. Breathe in slowly and as deeply as possible, raising the piston or the ball toward the top of the column. Hold your breath for 3-5 seconds or for as long as possible. Allow the piston or ball to fall to the bottom of the column. Remove the mouthpiece from your mouth and breathe out normally. Rest for a few seconds and repeat Steps 1 through 7 at least 10 times every 1-2 hours when you are awake. Take your time and take a few normal breaths between deep breaths. The spirometer may include an indicator to show your best effort. Use the indicator as a goal to work toward during each repetition. After each set of 10 deep breaths, practice coughing to be sure your lungs are clear. If you have an incision (the cut made at the time of surgery), support your incision when coughing by placing a pillow or rolled up towels firmly against it. Once you are able to get out of bed, walk around indoors and cough well. You may stop using the incentive spirometer when instructed by your caregiver.  RISKS AND COMPLICATIONS Take your time so you do not get dizzy or light-headed. If you are in pain, you may need to take or ask  for pain medication before doing  incentive spirometry. It is harder to take a deep breath if you are having pain. AFTER USE Rest and breathe slowly and easily. It can be helpful to keep track of a log of your progress. Your caregiver can provide you with a simple table to help with this. If you are using the spirometer at home, follow these instructions: [place] IF:  You are having difficultly using the spirometer. You have trouble using the spirometer as often as instructed. Your pain medication is not giving enough relief while using the spirometer. You develop fever of 100.5 F (38.1 C) or higher. SEEK IMMEDIATE MEDICAL CARE IF:  You cough up bloody sputum that had not been present before. You develop fever of 102 F (38.9 C) or greater. You develop worsening pain at or near the incision site. MAKE SURE YOU:  Understand these instructions. Will watch your condition. Will get help right away if you are not doing well or get worse. Document Released: 06/24/2006 Document Revised: 05/06/2011 Document Reviewed: 08/25/2006 [facility] Patient Information 2014 [place], [place].   ________________________________________________________________________

## 2021-03-13 ENCOUNTER — Encounter (HOSPITAL_COMMUNITY): Payer: Self-pay

## 2021-03-13 ENCOUNTER — Encounter (HOSPITAL_COMMUNITY)
Admission: RE | Admit: 2021-03-13 | Discharge: 2021-03-13 | Disposition: A | Discharge status: Home / Self Care | Payer: Medicare HMO | Source: Ambulatory Visit | Attending: Surgery | Admitting: Surgery

## 2021-03-13 ENCOUNTER — Other Ambulatory Visit: Payer: Self-pay

## 2021-03-13 VITALS — BP 145/94 | HR 60 | Temp 98.6°F | Ht 68.0 in | Wt 199.0 lb

## 2021-03-13 DIAGNOSIS — R739 Hyperglycemia, unspecified: Secondary | ICD-10-CM | POA: Diagnosis not present

## 2021-03-13 DIAGNOSIS — Z01812 Encounter for preprocedural laboratory examination: Secondary | ICD-10-CM | POA: Diagnosis not present

## 2021-03-13 DIAGNOSIS — Z01818 Encounter for other preprocedural examination: Secondary | ICD-10-CM

## 2021-03-13 DIAGNOSIS — I10 Essential (primary) hypertension: Secondary | ICD-10-CM

## 2021-03-13 HISTORY — DX: Cardiac arrhythmia, unspecified: I49.9

## 2021-03-13 LAB — CBC
HCT: 47.1 % (ref 39.0–52.0)
Hemoglobin: 15.6 g/dL (ref 13.0–17.0)
MCH: 31.8 pg (ref 26.0–34.0)
MCHC: 33.1 g/dL (ref 30.0–36.0)
MCV: 96.1 fL (ref 80.0–100.0)
Platelets: 212 10*3/uL (ref 150–400)
RBC: 4.9 MIL/uL (ref 4.22–5.81)
RDW: 12.3 % (ref 11.5–15.5)
WBC: 6.7 10*3/uL (ref 4.0–10.5)
nRBC: 0 % (ref 0.0–0.2)

## 2021-03-13 LAB — BASIC METABOLIC PANEL
Anion gap: 6 (ref 5–15)
BUN: 14 mg/dL (ref 8–23)
CO2: 26 mmol/L (ref 22–32)
Calcium: 9.5 mg/dL (ref 8.9–10.3)
Chloride: 103 mmol/L (ref 98–111)
Creatinine, Ser: 0.98 mg/dL (ref 0.61–1.24)
GFR, Estimated: >60 mL/min (ref >60)
Glucose, Bld: 129 mg/dL — ABNORMAL HIGH (ref 70–99)
Potassium: 4.4 mmol/L (ref 3.5–5.1)
Sodium: 135 mmol/L (ref 135–145)

## 2021-03-13 LAB — HEMOGLOBIN A1C
Hgb A1c MFr Bld: 5.7 % — ABNORMAL HIGH (ref 4.8–5.6)
Mean Plasma Glucose: 116.89 mg/dL

## 2021-03-13 NOTE — Consult Note (Signed)
[place] Nurse requested for preoperative stoma site marking  Discussed surgical procedure and stoma creation with patient.  Explained role of the [place] nurse team.  Provided the patient with educational booklet and provided samples of pouching options.  Patient had no questions, and according to the PST RN, Maudry Mayhew, did not know he may have to have an ostomy created during the surgery.  Examined patient sitting in order to place the marking in the patient's visual field, away from any creases or abdominal contour issues and within the rectus muscle.  Attempted to mark above the patient's belt line. The patient had on blue jeans and explained the waistband on those is where all of his waistbands are.  Marked for colostomy in the LMQ  __5__ cm to the left of the umbilicus and even with the umbilicus.  Marked for ileostomy in the RMQ  __5__cm to the right of the umbilicus and even with the umbilicus.  Patient's abdomen cleansed with CHG wipes at site markings, allowed to air dry prior to marking.Covered mark with thin film transparent dressing to preserve mark until date of surgery.   [place] Nurse team will follow up with patient after surgery for continue ostomy care and teaching.  I encouraged the patient to use the marking pen and reinforce the marks if the film dressing come off and the marks fade.  Val Riles, RN, MSN, CWOCN, CNS-BC, pager [phone]

## 2021-03-13 NOTE — Progress Notes (Addendum)
COVID Vaccine Completed: Date COVID Vaccine completed: COVID vaccine manufacturer: McMullin Test: 03/26/21 @ 12:00 PM PCP - Dr. Crist Infante Cardiologist - Dr. Eyvonne Left Camnitz. Clearance: Dayna Dum: 02/07/21: EPIC  Chest x-ray - 10/07/20 EPIC EKG - 11/29/20 Stress Test -  ECHO - 12/07/20 Cardiac Cath -  Pacemaker/ICD device last checked:  Sleep Study - Yes CPAP - Yes  Fasting Blood Sugar -  Checks Blood Sugar _____ times a day  Blood Thinner Instructions: Plavix will be held 5 days before surgery. Aspirin Instructions: Last Dose:  Anesthesia review: Hx: PAF,OSA(CPAP).,Watchman implant.  Patient denies shortness of breath, fever, cough and chest pain at PAT appointment   Patient verbalized understanding of instructions that were given to them at the PAT appointment. Patient was also instructed that they will need to review over the PAT instructions again at home before surgery.

## 2021-03-16 NOTE — Anesthesia Preprocedure Evaluation (Addendum)
Anesthesia Evaluation  Patient identified by MRN, date of birth, ID band Patient awake    Reviewed: Allergy & Precautions, NPO status , Patient's Chart, lab work & pertinent test results  Airway Mallampati: II  TM Distance: >3 FB Neck ROM: Full    Dental no notable dental hx.    Pulmonary sleep apnea and Continuous Positive Airway Pressure Ventilation ,    Pulmonary exam normal        Cardiovascular hypertension, Pt. on medications and Pt. on home beta blockers + dysrhythmias (on Plavix) Atrial Fibrillation  Rhythm:Regular Rate:Normal     Neuro/Psych negative neurological ROS  negative psych ROS   GI/Hepatic Neg liver ROS, GERD  Medicated,Diverticulitis    Endo/Other  negative endocrine ROS  Renal/GU negative Renal ROS  negative genitourinary   Musculoskeletal negative musculoskeletal ROS (+)   Abdominal Normal abdominal exam  (+)   Peds  Hematology negative hematology ROS (+)   Anesthesia Other Findings   Reproductive/Obstetrics                            Anesthesia Physical Anesthesia Plan  ASA: 3  Anesthesia Plan: General   Post-op Pain Management:    Induction: Intravenous  PONV Risk Score and Plan: 2 and Ondansetron, Dexamethasone and Treatment may vary due to age or medical condition  Airway Management Planned: Mask and Oral ETT  Additional Equipment: None  Intra-op Plan:   Post-operative Plan:   Informed Consent:   Plan Discussed with:   Anesthesia Plan Comments: (See PAT note 03/16/2021, Konrad Felix Ward, PA-C Lab Results      Component                Value               Date                      WBC                      6.7                 03/13/2021                HGB                      15.6                03/13/2021                HCT                      47.1                03/13/2021                MCV                      96.1                 03/13/2021                PLT                      212                 03/13/2021  Lab Results      Component                Value               Date                      NA                       135                 03/13/2021                K                        4.4                 03/13/2021                CO2                      26                  03/13/2021                GLUCOSE                  129 (H)             03/13/2021                BUN                      14                  03/13/2021                CREATININE               0.98                03/13/2021                CALCIUM                  9.5                 03/13/2021                EGFR                     88                  11/29/2020                GFRNONAA                 >60                 03/13/2021           Echo 12/07/2020 1. 27 mm Watchman FLX left atrial appendage occluder well seated, no  residual flow. Left atrial size was mildly dilated. No left atrial/left  atrial appendage thrombus was detected.  2. Left ventricular ejection fraction, by estimation, is 60 to 65%. The  left ventricle has normal function.  3. Right ventricular systolic function is normal. The right ventricular  size is normal.  4. The  mitral valve is grossly normal. Mild mitral valve regurgitation.  5. The aortic valve is normal in structure. Aortic valve regurgitation is  trivial. )       Anesthesia Quick Evaluation

## 2021-03-16 NOTE — Progress Notes (Signed)
Anesthesia Chart Review   Case: [id number] Date/Time: 03/28/21 1335   Procedures:      ROBOTIC RESECTION OF COLON, DESCENDING AND SIGMOID     POSSIBLE PRIMARY REPAIR UMBILICAL HERNIA     RIGID PROCTOSCOPY   Anesthesia type: General   Pre-op diagnosis: diverticulitis   [facility] ROOM 02 / [facility]   Surgeons: Michael Boston, MD       DISCUSSION:76 y.o. never smoker with h/o sleep apnea, PAF, Watchman Device in place, diverticulitis scheduled for above procedure 03/28/21 with Dr. Michael Boston.   Per cardiology preoperative evaluation 02/07/2021, "Chart reviewed as part of pre-operative protocol coverage. Patient was contacted 02/07/2021 in reference to pre-operative risk assessment for pending surgery as outlined below. As below, needs robotic resection of colon. Has history of atrial fibrillation. Had recent Watchman procedure 10/26/20. Per Joellen Jersey Thompson's note 11/29/20, " He was discharged on Eliquis 5mg  BID and ASA 81 mg for 45 days with plans to transition to DAPT with ASA/Plavix if 45 day TEE showed an adequate seal. He underwent his TEE 12/07/20 and was transitioned from Eliquis to Plavix. EF normal on TEE. No known history of occlusive CAD. RCRI 0.4% indicating low CV risk with surgery. I reached out to patient for update on how he is doing. The patient affirms he has been doing well without any new cardiac symptoms. Able to exert >4 METS without cardiac symptoms. He has to go up and down 16 steps 8-10x a day and does so without any CP or SOB. Therefore, based on ACC/AHA guidelines, the patient would be at acceptable risk for the planned procedure without further cardiovascular testing. The patient was advised that if he develops new symptoms prior to surgery to contact our office to arrange for a follow-up visit, and he verbalized understanding.   I have reached out to our structural team about blood thinner therapy. Per [place] patient may hold Plavix as requested for surgery. We generally  recommend holding 5-7 days but will defer to surgeon for final determination of duration. We typically advise that blood thinners be resumed when felt safe by performing physician. Surgeon's office has clarified that he can remain on aspirin."  Anticipate pt can proceed with planned procedure barring acute status change.   VS: There were no vitals taken for this visit.  PROVIDERS: Crist Infante, MD is PCP   Allegra Lai, MD is EP LABS: Labs reviewed: Acceptable for surgery. (all labs ordered are listed, but only abnormal results are displayed)  Labs Reviewed - No data to display   IMAGES:   EKG: 11/29/2020 Rate 55 bpm  Sinus bradycardia with 1st degree AV block   CV: Echo 12/07/2020  1. 27 mm Watchman FLX left atrial appendage occluder well seated, no  residual flow. Left atrial size was mildly dilated. No left atrial/left  atrial appendage thrombus was detected.   2. Left ventricular ejection fraction, by estimation, is 60 to 65%. The  left ventricle has normal function.   3. Right ventricular systolic function is normal. The right ventricular  size is normal.   4. The mitral valve is grossly normal. Mild mitral valve regurgitation.   5. The aortic valve is normal in structure. Aortic valve regurgitation is  trivial.  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   Right Scaphoid Fossa   PAF (paroxysmal atrial fibrillation) (HCC)    SCCA (squamous cell carcinoma) of skin 07/26/2020   Left Posterior Neck   Sleep apnea     Past Surgical History:  Procedure Laterality Date   APPENDECTOMY     ESOPHAGOGASTRIC FUNDOPLICATION     LEFT ATRIAL APPENDAGE OCCLUSION N/A 10/26/2020   27 mm Watchman left atrial appendage occlusion  device   TEE WITHOUT CARDIOVERSION N/A 10/26/2020   Procedure: TRANSESOPHAGEAL ECHOCARDIOGRAM (TEE);  Surgeon: Vickie Epley, MD;  Location: [facility];  Service: Cardiovascular;  Laterality: N/A;   TEE WITHOUT CARDIOVERSION N/A 12/07/2020   Procedure: TRANSESOPHAGEAL ECHOCARDIOGRAM (TEE);  Surgeon: Elouise Munroe, MD;  Location: [facility];  Service: Cardiology;  Laterality: N/A;    MEDICATIONS: No current facility-administered medications for this encounter.    acetaminophen (TYLENOL) 650 MG CR tablet   aspirin 81 MG chewable tablet   atorvastatin (LIPITOR) 20 MG tablet   B Complex-C (B-COMPLEX WITH VITAMIN C) tablet   cetirizine (ZYRTEC) 10 MG tablet   cholecalciferol (VITAMIN D) 25 MCG (1000 UNIT) tablet   clopidogrel (PLAVIX) 75 MG tablet   Coenzyme Q10 (COQ10) 200 MG CAPS   COVID-19 mRNA bivalent vaccine, Pfizer, (PFIZER COVID-19 VAC BIVALENT) injection   ferrous sulfate 325 (65 FE) MG tablet   flecainide (TAMBOCOR) 100 MG tablet   influenza vaccine adjuvanted (FLUAD QUADRIVALENT) 0.5 ML injection   metoprolol succinate (TOPROL-XL) 25 MG 24 hr tablet   Multiple Vitamins-Minerals (CENTRUM ADULTS PO)   Omega 3 1000 MG CAPS   pantoprazole (PROTONIX) 40 MG tablet   polyethylene glycol (MIRALAX / GLYCOLAX) 17 g packet   triamcinolone (NASACORT) 55 MCG/ACT AERO nasal inhaler     Konrad Felix Ward, PA-C [facility] Pre-Surgical Testing [phone]

## 2021-03-19 ENCOUNTER — Encounter (HOSPITAL_COMMUNITY): Payer: Self-pay | Admitting: Gastroenterology

## 2021-03-25 DIAGNOSIS — E781 Pure hyperglyceridemia: Secondary | ICD-10-CM | POA: Diagnosis not present

## 2021-03-25 DIAGNOSIS — M542 Cervicalgia: Secondary | ICD-10-CM | POA: Diagnosis not present

## 2021-03-25 DIAGNOSIS — I480 Paroxysmal atrial fibrillation: Secondary | ICD-10-CM | POA: Diagnosis not present

## 2021-03-26 ENCOUNTER — Other Ambulatory Visit: Payer: Self-pay

## 2021-03-26 ENCOUNTER — Encounter (HOSPITAL_COMMUNITY): Payer: Medicare HMO

## 2021-03-26 ENCOUNTER — Encounter (HOSPITAL_COMMUNITY): Payer: Self-pay

## 2021-03-26 ENCOUNTER — Encounter (HOSPITAL_COMMUNITY)
Admission: RE | Admit: 2021-03-26 | Discharge: 2021-03-26 | Disposition: A | Discharge status: Home / Self Care | Payer: Medicare HMO | Source: Ambulatory Visit | Attending: Surgery | Admitting: Surgery

## 2021-03-26 DIAGNOSIS — Z01812 Encounter for preprocedural laboratory examination: Secondary | ICD-10-CM

## 2021-03-26 DIAGNOSIS — H401123 Primary open-angle glaucoma, left eye, severe stage: Secondary | ICD-10-CM | POA: Diagnosis not present

## 2021-03-26 DIAGNOSIS — Z20822 Contact with and (suspected) exposure to covid-19: Secondary | ICD-10-CM | POA: Insufficient documentation

## 2021-03-26 DIAGNOSIS — H401113 Primary open-angle glaucoma, right eye, severe stage: Secondary | ICD-10-CM | POA: Diagnosis not present

## 2021-03-26 LAB — SARS CORONAVIRUS 2 (TAT 6-24 HRS): SARS Coronavirus 2: NEGATIVE

## 2021-03-27 ENCOUNTER — Ambulatory Visit (HOSPITAL_COMMUNITY): Payer: Medicare HMO | Admitting: Anesthesiology

## 2021-03-27 ENCOUNTER — Ambulatory Visit (HOSPITAL_COMMUNITY)
Admission: RE | Admit: 2021-03-27 | Discharge: 2021-03-27 | Disposition: A | Discharge status: Home / Self Care | Payer: Medicare HMO | Source: Home / Self Care | Attending: Gastroenterology | Admitting: Gastroenterology

## 2021-03-27 ENCOUNTER — Ambulatory Visit (HOSPITAL_COMMUNITY): Payer: Medicare HMO | Admitting: Physician Assistant

## 2021-03-27 ENCOUNTER — Encounter (HOSPITAL_COMMUNITY): Payer: Self-pay | Admitting: Gastroenterology

## 2021-03-27 ENCOUNTER — Encounter (HOSPITAL_COMMUNITY)
Admission: RE | Disposition: A | Discharge status: Home / Self Care | Payer: Self-pay | Source: Home / Self Care | Attending: Gastroenterology

## 2021-03-27 DIAGNOSIS — Z79899 Other long term (current) drug therapy: Secondary | ICD-10-CM | POA: Insufficient documentation

## 2021-03-27 DIAGNOSIS — Z1211 Encounter for screening for malignant neoplasm of colon: Secondary | ICD-10-CM | POA: Insufficient documentation

## 2021-03-27 DIAGNOSIS — N9972 Accidental puncture and laceration of a genitourinary system organ or structure during other procedure: Secondary | ICD-10-CM | POA: Diagnosis not present

## 2021-03-27 DIAGNOSIS — K219 Gastro-esophageal reflux disease without esophagitis: Secondary | ICD-10-CM | POA: Insufficient documentation

## 2021-03-27 DIAGNOSIS — Z86010 Personal history of colonic polyps: Secondary | ICD-10-CM | POA: Insufficient documentation

## 2021-03-27 DIAGNOSIS — H903 Sensorineural hearing loss, bilateral: Secondary | ICD-10-CM | POA: Diagnosis not present

## 2021-03-27 DIAGNOSIS — K644 Residual hemorrhoidal skin tags: Secondary | ICD-10-CM | POA: Insufficient documentation

## 2021-03-27 DIAGNOSIS — K635 Polyp of colon: Secondary | ICD-10-CM | POA: Diagnosis not present

## 2021-03-27 DIAGNOSIS — K5730 Diverticulosis of large intestine without perforation or abscess without bleeding: Secondary | ICD-10-CM | POA: Diagnosis not present

## 2021-03-27 DIAGNOSIS — E669 Obesity, unspecified: Secondary | ICD-10-CM | POA: Insufficient documentation

## 2021-03-27 DIAGNOSIS — K430 Incisional hernia with obstruction, without gangrene: Secondary | ICD-10-CM | POA: Diagnosis not present

## 2021-03-27 DIAGNOSIS — I10 Essential (primary) hypertension: Secondary | ICD-10-CM | POA: Diagnosis not present

## 2021-03-27 DIAGNOSIS — Z6830 Body mass index (BMI) 30.0-30.9, adult: Secondary | ICD-10-CM | POA: Insufficient documentation

## 2021-03-27 DIAGNOSIS — K648 Other hemorrhoids: Secondary | ICD-10-CM | POA: Insufficient documentation

## 2021-03-27 DIAGNOSIS — G4730 Sleep apnea, unspecified: Secondary | ICD-10-CM | POA: Insufficient documentation

## 2021-03-27 DIAGNOSIS — D123 Benign neoplasm of transverse colon: Secondary | ICD-10-CM | POA: Insufficient documentation

## 2021-03-27 DIAGNOSIS — I480 Paroxysmal atrial fibrillation: Secondary | ICD-10-CM | POA: Insufficient documentation

## 2021-03-27 DIAGNOSIS — K5732 Diverticulitis of large intestine without perforation or abscess without bleeding: Secondary | ICD-10-CM | POA: Diagnosis not present

## 2021-03-27 DIAGNOSIS — E785 Hyperlipidemia, unspecified: Secondary | ICD-10-CM | POA: Insufficient documentation

## 2021-03-27 DIAGNOSIS — Z20822 Contact with and (suspected) exposure to covid-19: Secondary | ICD-10-CM | POA: Diagnosis not present

## 2021-03-27 DIAGNOSIS — G4733 Obstructive sleep apnea (adult) (pediatric): Secondary | ICD-10-CM | POA: Diagnosis not present

## 2021-03-27 HISTORY — PX: POLYPECTOMY: SHX5525

## 2021-03-27 HISTORY — PX: COLONOSCOPY WITH PROPOFOL: SHX5780

## 2021-03-27 SURGERY — COLONOSCOPY WITH PROPOFOL
Anesthesia: Monitor Anesthesia Care

## 2021-03-27 MED ORDER — PROPOFOL 500 MG/50ML IV EMUL
INTRAVENOUS | Status: DC | PRN
  Administered 2021-03-27: 130 ug/kg/min via INTRAVENOUS

## 2021-03-27 MED ORDER — LACTATED RINGERS IV SOLN: INTRAVENOUS | Status: DC

## 2021-03-27 MED ORDER — PROPOFOL 500 MG/50ML IV EMUL
INTRAVENOUS | Status: AC
  Filled 2021-03-27: qty 50

## 2021-03-27 MED ORDER — SODIUM CHLORIDE 0.9 % IV SOLN: INTRAVENOUS | Status: DC

## 2021-03-27 MED ORDER — PROPOFOL 10 MG/ML IV BOLUS
INTRAVENOUS | Status: DC | PRN
  Administered 2021-03-27: 30 mg via INTRAVENOUS
  Administered 2021-03-27: 20 mg via INTRAVENOUS

## 2021-03-27 SURGICAL SUPPLY — 22 items

## 2021-03-27 NOTE — Transfer of Care (Signed)
Immediate Anesthesia Transfer of Care Note  Patient: Zachary Montoya  Procedure(s) Performed: COLONOSCOPY WITH PROPOFOL POLYPECTOMY  Patient Location: PACU  Anesthesia Type:MAC  Level of Consciousness: sedated  Airway & Oxygen Therapy: Patient Spontanous Breathing and Patient connected to face mask oxygen  Post-op Assessment: Report given to RN and Post -op Vital signs reviewed and stable  Post vital signs: Reviewed and stable  Last Vitals:  Vitals Value Taken Time  BP    Temp    Pulse 54 03/27/21 1058  Resp 16 03/27/21 1058  SpO2 100 % 03/27/21 1058  Vitals shown include unvalidated device data.  Last Pain:  Vitals:   03/27/21 0823  TempSrc: Oral         Complications: No notable events documented.

## 2021-03-27 NOTE — Op Note (Signed)
[facility] Patient Name: Zachary Montoya Procedure Date: 03/27/2021 MRN: [id number] Attending MD: Clarene Essex , MD Date of Birth: 1945-06-20 CSN: [id number] Age: 76 Admit Type: Outpatient Procedure:                Colonoscopy Indications:              High risk colon cancer surveillance: Personal                            history of colonic polyps, Last colonoscopy 10                            years ago, Incidental - Abnormal CT of the GI tract Providers:                Clarene Essex, MD, Jaci Carrel, RN, Cletis Athens,                            Technician, [place] CRNA, CRNA Referring MD:              Medicines:                Propofol total dose 397 mg IV Complications:            No immediate complications. Estimated Blood Loss:     Estimated blood loss: none. Procedure:                Pre-Anesthesia Assessment:                           - Prior to the procedure, a History and Physical                            was performed, and patient medications and                            allergies were reviewed. The patient's tolerance of                            previous anesthesia was also reviewed. The risks                            and benefits of the procedure and the sedation                            options and risks were discussed with the patient.                            All questions were answered, and informed consent                            was obtained. Prior Anticoagulants: The patient has                            taken Plavix (clopidogrel), last dose was 7 days  prior to procedure. ASA Grade Assessment: III - A                            patient with severe systemic disease. After                            reviewing the risks and benefits, the patient was                            deemed in satisfactory condition to undergo the                            procedure.                           After obtaining  informed consent, the colonoscope                            was passed under direct vision. Throughout the                            procedure, the patient's blood pressure, pulse, and                            oxygen saturations were monitored continuously. The                            CF-HQ190L ([id number]) Olympus colonoscope was                            introduced through the anus and advanced to the the                            terminal ileum. The terminal ileum, ileocecal                            valve, appendiceal orifice, and rectum were                            photographed. The colonoscopy was performed without                            difficulty. The patient tolerated the procedure                            well. The quality of the bowel preparation was                            adequate. Scope In: 10:28:17 AM Scope Out: 10:52:39 AM Scope Withdrawal Time: 0 hours 14 minutes 50 seconds  Total Procedure Duration: 0 hours 24 minutes 22 seconds  Findings:      External and internal hemorrhoids were found during retroflexion, during       perianal exam and during digital exam. The hemorrhoids were small.  Scattered small-mouthed diverticula were found in the sigmoid colon.      A few small-mouthed diverticula were found in the descending colon.      Two semi-sessile and semi-pedunculated polyps were found in the splenic       flexure and mid transverse colon. The polyps were small in size. These       polyps were removed with a cold snare. Resection and retrieval were       complete.      The terminal ileum appeared normal.      The exam was otherwise without abnormality. Impression:               - External and internal hemorrhoids.                           - Diverticulosis in the sigmoid colon.                           - Diverticulosis in the descending colon.                           - Two small polyps at the splenic flexure and in                             the mid transverse colon, removed with a cold                            snare. Resected and retrieved.                           - The examined portion of the ileum was normal.                           - The examination was otherwise normal. Moderate Sedation:      Not Applicable - Patient had care per Anesthesia. Recommendation:           - Patient has a contact number available for                            emergencies. The signs and symptoms of potential                            delayed complications were discussed with the                            patient. Return to normal activities tomorrow.                            Written discharge instructions were provided to the                            patient.                           - Clear liquid diet today.                           -  Continue present medications.                           - Resume Plavix (clopidogrel) at prior dose Per                            surgery team.                           - Await pathology results.                           - Repeat colonoscopy in 5 years for surveillance                            based on pathology results.                           - Return to GI office PRN.                           - Telephone GI clinic for pathology results in 1                            week.                           - Telephone GI clinic if symptomatic PRN. Procedure Code(s):        --- Professional ---                           [phone], Colonoscopy, flexible; with removal of                            tumor(s), polyp(s), or other lesion(s) by snare                            technique Diagnosis Code(s):        --- Professional ---                           Z86.010, Personal history of colonic polyps                           K63.5, Polyp of colon                           K57.30, Diverticulosis of large intestine without                            perforation or abscess without bleeding CPT  copyright 2019 American Medical Association. All rights reserved. The codes documented in this report are preliminary and upon coder review may  be revised to meet current compliance requirements. Clarene Essex, MD 03/27/2021 11:11:35 AM This report has been signed electronically. Number of Addenda: 0

## 2021-03-27 NOTE — Anesthesia Preprocedure Evaluation (Signed)
Anesthesia Evaluation  Patient identified by MRN, date of birth, ID band Patient awake    Reviewed: Allergy & Precautions, NPO status , Patient's Chart, lab work & pertinent test results, reviewed documented beta blocker date and time   Airway Mallampati: II  TM Distance: >3 FB Neck ROM: Full    Dental  (+) Teeth Intact, Dental Advisory Given, Caps,    Pulmonary sleep apnea and Continuous Positive Airway Pressure Ventilation ,    Pulmonary exam normal breath sounds clear to auscultation       Cardiovascular hypertension, Pt. on medications and Pt. on home beta blockers Normal cardiovascular exam+ dysrhythmias (s/p Watchman device) Atrial Fibrillation  Rhythm:Regular Rate:Normal     Neuro/Psych negative neurological ROS  negative psych ROS   GI/Hepatic Neg liver ROS, GERD  Medicated,  Endo/Other  Obesity   Renal/GU negative Renal ROS     Musculoskeletal negative musculoskeletal ROS (+)   Abdominal   Peds  Hematology  (+) Blood dyscrasia (Plavix), ,   Anesthesia Other Findings Day of surgery medications reviewed with the patient.  Reproductive/Obstetrics                             Anesthesia Physical Anesthesia Plan  ASA: 3  Anesthesia Plan: MAC   Post-op Pain Management: Minimal or no pain anticipated   Induction: Intravenous  PONV Risk Score and Plan: 1 and Propofol infusion and Treatment may vary due to age or medical condition  Airway Management Planned: Natural Airway and Simple Face Mask  Additional Equipment:   Intra-op Plan:   Post-operative Plan:   Informed Consent: I have reviewed the patients History and Physical, chart, labs and discussed the procedure including the risks, benefits and alternatives for the proposed anesthesia with the patient or authorized representative who has indicated his/her understanding and acceptance.     Dental advisory given  Plan  Discussed with: CRNA  Anesthesia Plan Comments:         Anesthesia Quick Evaluation

## 2021-03-27 NOTE — Discharge Instructions (Addendum)
Clear liquid diet do not advance because of surgery tomorrow and call if question or problem and continue to hold your blood thinner and aspirin for surgery tomorrow and call me if you do not hear back about the biopsies in 1 week and follow-up as needed in the future  YOU HAD AN ENDOSCOPIC PROCEDURE TODAY: Refer to the procedure report and other information in the discharge instructions given to you for any specific questions about what was found during the examination. If this information does not answer your questions, please call [facility] office at [phone] to clarify.   YOU SHOULD EXPECT: Some feelings of bloating in the abdomen. Passage of more gas than usual. Walking can help get rid of the air that was put into your GI tract during the procedure and reduce the bloating. If you had a lower endoscopy (such as a colonoscopy or flexible sigmoidoscopy) you may notice spotting of blood in your stool or on the toilet paper. Some abdominal soreness may be present for a day or two, also.  DIET: Your first meal following the procedure should be a light meal and then it is ok to progress to your normal diet. A half-sandwich or bowl of soup is an example of a good first meal. Heavy or fried foods are harder to digest and may make you feel nauseous or bloated. Drink plenty of fluids but you should avoid alcoholic beverages for 24 hours. If you had a esophageal dilation, please see attached instructions for diet.    ACTIVITY: Your care partner should take you home directly after the procedure. You should plan to take it easy, moving slowly for the rest of the day. You can resume normal activity the day after the procedure however YOU SHOULD NOT DRIVE, use power tools, machinery or perform tasks that involve climbing or major physical exertion for 24 hours (because of the sedation medicines used during the test).   SYMPTOMS TO REPORT IMMEDIATELY: A gastroenterologist can be reached at any hour. Please call  [phone]  for any of the following symptoms:  Following lower endoscopy (colonoscopy, flexible sigmoidoscopy) Excessive amounts of blood in the stool  Significant tenderness, worsening of abdominal pains  Swelling of the abdomen that is new, acute  Fever of 100 or higher   FOLLOW UP:  If any biopsies were taken you will be contacted by phone or by letter within the next 1-3 weeks. Call [phone]  if you have not heard about the biopsies in 3 weeks.  Please also call with any specific questions about appointments or follow up tests.

## 2021-03-27 NOTE — Progress Notes (Signed)
Ardelle Anton Juhasz 10:21 AM  Subjective: Patient seen and examined and is done well for the last month and is planning surgery tomorrow and we rediscussed his procedure he has no urinary complaints and MiraLAX tends to help  Objective: Vital signs stable afebrile no acute distress exam please see preassessment evaluation  Assessment: Abnormal CT  Plan: Okay to proceed with colonoscopy with anesthesia assistance  [facility]  office [phone] After 5PM or if no answer call [phone]

## 2021-03-27 NOTE — Anesthesia Postprocedure Evaluation (Signed)
Anesthesia Post Note  Patient: Zachary Montoya  Procedure(s) Performed: COLONOSCOPY WITH PROPOFOL POLYPECTOMY     Patient location during evaluation: Endoscopy Anesthesia Type: MAC Level of consciousness: oriented, awake and alert and awake Pain management: pain level controlled Vital Signs Assessment: post-procedure vital signs reviewed and stable Respiratory status: spontaneous breathing, nonlabored ventilation, respiratory function stable and patient connected to nasal cannula oxygen Cardiovascular status: blood pressure returned to baseline and stable Postop Assessment: no headache, no backache and no apparent nausea or vomiting Anesthetic complications: no   No notable events documented.  Last Vitals:  Vitals:   03/27/21 1110 03/27/21 1120  BP: 116/64 (!) 144/81  Pulse: (!) 48 (!) 48  Resp: 11 (!) 9  Temp:    SpO2: 99% 98%    Last Pain:  Vitals:   03/27/21 1120  TempSrc:   PainSc: 0-No pain                 Santa Lighter

## 2021-03-28 ENCOUNTER — Inpatient Hospital Stay (HOSPITAL_COMMUNITY)
Admission: RE | Admit: 2021-03-28 | Discharge: 2021-03-31 | DRG: 330 | Disposition: A | Discharge status: Home / Self Care | Payer: Medicare HMO | Source: Ambulatory Visit | Attending: Surgery | Admitting: Surgery

## 2021-03-28 ENCOUNTER — Inpatient Hospital Stay (HOSPITAL_COMMUNITY): Payer: Medicare HMO | Admitting: Physician Assistant

## 2021-03-28 ENCOUNTER — Other Ambulatory Visit: Payer: Self-pay

## 2021-03-28 ENCOUNTER — Encounter (HOSPITAL_COMMUNITY)
Admission: RE | Disposition: A | Discharge status: Home / Self Care | Payer: Self-pay | Source: Ambulatory Visit | Attending: Surgery

## 2021-03-28 ENCOUNTER — Encounter (HOSPITAL_COMMUNITY): Payer: Self-pay | Admitting: Gastroenterology

## 2021-03-28 DIAGNOSIS — M792 Neuralgia and neuritis, unspecified: Secondary | ICD-10-CM | POA: Diagnosis present

## 2021-03-28 DIAGNOSIS — H401133 Primary open-angle glaucoma, bilateral, severe stage: Secondary | ICD-10-CM | POA: Diagnosis present

## 2021-03-28 DIAGNOSIS — Z833 Family history of diabetes mellitus: Secondary | ICD-10-CM | POA: Diagnosis not present

## 2021-03-28 DIAGNOSIS — Z7982 Long term (current) use of aspirin: Secondary | ICD-10-CM | POA: Diagnosis not present

## 2021-03-28 DIAGNOSIS — E781 Pure hyperglyceridemia: Secondary | ICD-10-CM | POA: Diagnosis present

## 2021-03-28 DIAGNOSIS — K430 Incisional hernia with obstruction, without gangrene: Secondary | ICD-10-CM | POA: Diagnosis present

## 2021-03-28 DIAGNOSIS — K5732 Diverticulitis of large intestine without perforation or abscess without bleeding: Secondary | ICD-10-CM | POA: Diagnosis not present

## 2021-03-28 DIAGNOSIS — Z8249 Family history of ischemic heart disease and other diseases of the circulatory system: Secondary | ICD-10-CM

## 2021-03-28 DIAGNOSIS — I10 Essential (primary) hypertension: Secondary | ICD-10-CM | POA: Diagnosis not present

## 2021-03-28 DIAGNOSIS — K219 Gastro-esophageal reflux disease without esophagitis: Secondary | ICD-10-CM | POA: Diagnosis not present

## 2021-03-28 DIAGNOSIS — Z7902 Long term (current) use of antithrombotics/antiplatelets: Secondary | ICD-10-CM

## 2021-03-28 DIAGNOSIS — Z7901 Long term (current) use of anticoagulants: Secondary | ICD-10-CM

## 2021-03-28 DIAGNOSIS — Z6830 Body mass index (BMI) 30.0-30.9, adult: Secondary | ICD-10-CM | POA: Diagnosis not present

## 2021-03-28 DIAGNOSIS — H547 Unspecified visual loss: Secondary | ICD-10-CM | POA: Diagnosis present

## 2021-03-28 DIAGNOSIS — Z95818 Presence of other cardiac implants and grafts: Secondary | ICD-10-CM | POA: Diagnosis not present

## 2021-03-28 DIAGNOSIS — G4733 Obstructive sleep apnea (adult) (pediatric): Secondary | ICD-10-CM | POA: Diagnosis not present

## 2021-03-28 DIAGNOSIS — H903 Sensorineural hearing loss, bilateral: Secondary | ICD-10-CM | POA: Diagnosis present

## 2021-03-28 DIAGNOSIS — N9972 Accidental puncture and laceration of a genitourinary system organ or structure during other procedure: Secondary | ICD-10-CM | POA: Diagnosis not present

## 2021-03-28 DIAGNOSIS — Z888 Allergy status to other drugs, medicaments and biological substances status: Secondary | ICD-10-CM

## 2021-03-28 DIAGNOSIS — K5730 Diverticulosis of large intestine without perforation or abscess without bleeding: Secondary | ICD-10-CM | POA: Diagnosis not present

## 2021-03-28 DIAGNOSIS — Z91030 Bee allergy status: Secondary | ICD-10-CM

## 2021-03-28 DIAGNOSIS — E669 Obesity, unspecified: Secondary | ICD-10-CM | POA: Diagnosis present

## 2021-03-28 DIAGNOSIS — Z7951 Long term (current) use of inhaled steroids: Secondary | ICD-10-CM

## 2021-03-28 DIAGNOSIS — I480 Paroxysmal atrial fibrillation: Secondary | ICD-10-CM | POA: Diagnosis present

## 2021-03-28 DIAGNOSIS — Z20822 Contact with and (suspected) exposure to covid-19: Secondary | ICD-10-CM | POA: Diagnosis not present

## 2021-03-28 DIAGNOSIS — Z9989 Dependence on other enabling machines and devices: Secondary | ICD-10-CM | POA: Diagnosis not present

## 2021-03-28 DIAGNOSIS — E785 Hyperlipidemia, unspecified: Secondary | ICD-10-CM | POA: Diagnosis present

## 2021-03-28 DIAGNOSIS — Z885 Allergy status to narcotic agent status: Secondary | ICD-10-CM

## 2021-03-28 DIAGNOSIS — Z823 Family history of stroke: Secondary | ICD-10-CM

## 2021-03-28 DIAGNOSIS — K5720 Diverticulitis of large intestine with perforation and abscess without bleeding: Secondary | ICD-10-CM | POA: Diagnosis present

## 2021-03-28 DIAGNOSIS — Z79899 Other long term (current) drug therapy: Secondary | ICD-10-CM

## 2021-03-28 DIAGNOSIS — Z01812 Encounter for preprocedural laboratory examination: Secondary | ICD-10-CM

## 2021-03-28 DIAGNOSIS — E7800 Pure hypercholesterolemia, unspecified: Secondary | ICD-10-CM | POA: Diagnosis present

## 2021-03-28 HISTORY — PX: UMBILICAL HERNIA REPAIR: SHX196

## 2021-03-28 HISTORY — PX: PROCTOSCOPY: SHX2266

## 2021-03-28 LAB — TYPE AND SCREEN
ABO/RH(D): A POS
Antibody Screen: NEGATIVE

## 2021-03-28 LAB — SURGICAL PATHOLOGY

## 2021-03-28 SURGERY — COLECTOMY, SIGMOID, ROBOT-ASSISTED
Anesthesia: General | Site: Abdomen

## 2021-03-28 MED ORDER — POLYETHYLENE GLYCOL 3350 17 GM/SCOOP PO POWD: 1.0000 | Freq: Once | ORAL | Status: DC

## 2021-03-28 MED ORDER — KETAMINE HCL 50 MG/5ML IJ SOSY
PREFILLED_SYRINGE | INTRAMUSCULAR | Status: AC
  Filled 2021-03-28: qty 5

## 2021-03-28 MED ORDER — ENSURE SURGERY PO LIQD
237.0000 mL | Freq: Two times a day (BID) | ORAL | Status: DC
  Administered 2021-03-29: 237 mL via ORAL

## 2021-03-28 MED ORDER — LIDOCAINE 2% (20 MG/ML) 5 ML SYRINGE
INTRAMUSCULAR | Status: DC | PRN
  Administered 2021-03-28: 1.5 mg/kg/h via INTRAVENOUS

## 2021-03-28 MED ORDER — FENTANYL CITRATE PF 50 MCG/ML IJ SOSY
PREFILLED_SYRINGE | INTRAMUSCULAR | Status: AC
  Filled 2021-03-28: qty 1

## 2021-03-28 MED ORDER — LORATADINE 10 MG PO TABS
10.0000 mg | ORAL_TABLET | Freq: Every day | ORAL | Status: DC
  Administered 2021-03-29 – 2021-03-30 (×2): 10 mg via ORAL
  Filled 2021-03-28 (×2): qty 1

## 2021-03-28 MED ORDER — FENTANYL CITRATE PF 50 MCG/ML IJ SOSY
25.0000 ug | PREFILLED_SYRINGE | INTRAMUSCULAR | Status: DC | PRN
  Administered 2021-03-28: 25 ug via INTRAVENOUS

## 2021-03-28 MED ORDER — METOPROLOL SUCCINATE ER 25 MG PO TB24
25.0000 mg | ORAL_TABLET | Freq: Every day | ORAL | Status: DC
  Administered 2021-03-29 – 2021-03-30 (×2): 25 mg via ORAL
  Filled 2021-03-28 (×3): qty 1

## 2021-03-28 MED ORDER — DEXAMETHASONE SODIUM PHOSPHATE 10 MG/ML IJ SOLN
INTRAMUSCULAR | Status: AC
  Filled 2021-03-28: qty 1

## 2021-03-28 MED ORDER — METOPROLOL TARTRATE 5 MG/5ML IV SOLN: 5.0000 mg | Freq: Four times a day (QID) | INTRAVENOUS | Status: DC | PRN

## 2021-03-28 MED ORDER — MELATONIN 3 MG PO TABS
3.0000 mg | ORAL_TABLET | Freq: Every evening | ORAL | Status: DC | PRN
  Administered 2021-03-29 – 2021-03-30 (×2): 3 mg via ORAL
  Filled 2021-03-28 (×2): qty 1

## 2021-03-28 MED ORDER — SODIUM CHLORIDE 0.9% FLUSH
3.0000 mL | Freq: Two times a day (BID) | INTRAVENOUS | Status: DC
  Administered 2021-03-29 – 2021-03-30 (×4): 3 mL via INTRAVENOUS

## 2021-03-28 MED ORDER — GABAPENTIN 300 MG PO CAPS
300.0000 mg | ORAL_CAPSULE | ORAL | Status: AC
  Administered 2021-03-28: 300 mg via ORAL
  Filled 2021-03-28: qty 1

## 2021-03-28 MED ORDER — TRIAMCINOLONE ACETONIDE 55 MCG/ACT NA AERO: 1.0000 | INHALATION_SPRAY | Freq: Every day | NASAL | Status: DC | PRN

## 2021-03-28 MED ORDER — FENTANYL CITRATE (PF) 100 MCG/2ML IJ SOLN
INTRAMUSCULAR | Status: AC
  Filled 2021-03-28: qty 2

## 2021-03-28 MED ORDER — ONDANSETRON HCL 4 MG/2ML IJ SOLN
INTRAMUSCULAR | Status: DC | PRN
  Administered 2021-03-28: 4 mg via INTRAVENOUS

## 2021-03-28 MED ORDER — EPHEDRINE SULFATE-NACL 50-0.9 MG/10ML-% IV SOSY
PREFILLED_SYRINGE | INTRAVENOUS | Status: DC | PRN
  Administered 2021-03-28 (×2): 5 mg via INTRAVENOUS

## 2021-03-28 MED ORDER — METHYLENE BLUE 0.5 % INJ SOLN
INTRAVENOUS | Status: DC | PRN
  Administered 2021-03-28: 10 mL

## 2021-03-28 MED ORDER — SODIUM CHLORIDE 0.9% FLUSH: 3.0000 mL | INTRAVENOUS | Status: DC | PRN

## 2021-03-28 MED ORDER — ENSURE PRE-SURGERY PO LIQD: 592.0000 mL | Freq: Once | ORAL | Status: DC

## 2021-03-28 MED ORDER — ASPIRIN 81 MG PO CHEW
81.0000 mg | CHEWABLE_TABLET | Freq: Every evening | ORAL | Status: DC
  Administered 2021-03-28 – 2021-03-30 (×3): 81 mg via ORAL
  Filled 2021-03-28 (×3): qty 1

## 2021-03-28 MED ORDER — METHOCARBAMOL 500 MG PO TABS: 1000.0000 mg | ORAL_TABLET | Freq: Four times a day (QID) | ORAL | Status: DC | PRN

## 2021-03-28 MED ORDER — PHENYLEPHRINE HCL-NACL 20-0.9 MG/250ML-% IV SOLN
INTRAVENOUS | Status: DC | PRN
  Administered 2021-03-28: 35 ug/min via INTRAVENOUS

## 2021-03-28 MED ORDER — ALBUTEROL SULFATE (2.5 MG/3ML) 0.083% IN NEBU: 2.5000 mg | INHALATION_SOLUTION | Freq: Four times a day (QID) | RESPIRATORY_TRACT | Status: DC | PRN

## 2021-03-28 MED ORDER — SIMETHICONE 80 MG PO CHEW: 40.0000 mg | CHEWABLE_TABLET | Freq: Four times a day (QID) | ORAL | Status: DC | PRN

## 2021-03-28 MED ORDER — LACTATED RINGERS IV SOLN: INTRAVENOUS | Status: DC

## 2021-03-28 MED ORDER — FLECAINIDE ACETATE 100 MG PO TABS
100.0000 mg | ORAL_TABLET | Freq: Two times a day (BID) | ORAL | Status: DC
  Administered 2021-03-28 – 2021-03-30 (×5): 100 mg via ORAL
  Filled 2021-03-28 (×6): qty 1

## 2021-03-28 MED ORDER — FENTANYL CITRATE (PF) 250 MCG/5ML IJ SOLN
INTRAMUSCULAR | Status: DC | PRN
  Administered 2021-03-28: 100 ug via INTRAVENOUS
  Administered 2021-03-28 (×4): 50 ug via INTRAVENOUS

## 2021-03-28 MED ORDER — SODIUM CHLORIDE 0.9 % IV SOLN
2.0000 g | Freq: Two times a day (BID) | INTRAVENOUS | Status: AC
  Administered 2021-03-28: 2 g via INTRAVENOUS
  Filled 2021-03-28: qty 2

## 2021-03-28 MED ORDER — LIDOCAINE 2% (20 MG/ML) 5 ML SYRINGE
INTRAMUSCULAR | Status: DC | PRN
  Administered 2021-03-28: 60 mg via INTRAVENOUS

## 2021-03-28 MED ORDER — EPHEDRINE 5 MG/ML INJ
INTRAVENOUS | Status: AC
  Filled 2021-03-28: qty 5

## 2021-03-28 MED ORDER — PROPOFOL 10 MG/ML IV BOLUS
INTRAVENOUS | Status: AC
  Filled 2021-03-28: qty 40

## 2021-03-28 MED ORDER — NEOMYCIN SULFATE 500 MG PO TABS: 1000.0000 mg | ORAL_TABLET | ORAL | Status: DC

## 2021-03-28 MED ORDER — ENOXAPARIN SODIUM 40 MG/0.4ML IJ SOSY
40.0000 mg | PREFILLED_SYRINGE | INTRAMUSCULAR | Status: DC
Start: 2021-03-29 — End: 2021-03-31
  Administered 2021-03-29 – 2021-03-31 (×3): 40 mg via SUBCUTANEOUS
  Filled 2021-03-28 (×3): qty 0.4

## 2021-03-28 MED ORDER — HYDROMORPHONE HCL 1 MG/ML IJ SOLN
0.5000 mg | INTRAMUSCULAR | Status: DC | PRN
  Administered 2021-03-28 – 2021-03-30 (×2): 1 mg via INTRAVENOUS
  Filled 2021-03-28 (×2): qty 1

## 2021-03-28 MED ORDER — LACTATED RINGERS IR SOLN
Status: DC | PRN
  Administered 2021-03-28: 1000 mL

## 2021-03-28 MED ORDER — ALVIMOPAN 12 MG PO CAPS
12.0000 mg | ORAL_CAPSULE | ORAL | Status: AC
  Administered 2021-03-28: 12 mg via ORAL
  Filled 2021-03-28: qty 1

## 2021-03-28 MED ORDER — TRAMADOL HCL 50 MG PO TABS
50.0000 mg | ORAL_TABLET | Freq: Four times a day (QID) | ORAL | Status: DC | PRN
  Administered 2021-03-29: 50 mg via ORAL
  Administered 2021-03-29: 100 mg via ORAL
  Administered 2021-03-29: 50 mg via ORAL
  Administered 2021-03-30: 100 mg via ORAL
  Filled 2021-03-28 (×3): qty 2
  Filled 2021-03-28 (×2): qty 1

## 2021-03-28 MED ORDER — ACETAMINOPHEN 10 MG/ML IV SOLN: 1000.0000 mg | Freq: Once | INTRAVENOUS | Status: DC | PRN

## 2021-03-28 MED ORDER — SPY AGENT GREEN - (INDOCYANINE FOR INJECTION)
INTRAMUSCULAR | Status: DC | PRN
  Administered 2021-03-28: 4 mL via INTRAVENOUS

## 2021-03-28 MED ORDER — ATORVASTATIN CALCIUM 20 MG PO TABS
20.0000 mg | ORAL_TABLET | Freq: Every day | ORAL | Status: DC
  Administered 2021-03-28 – 2021-03-30 (×3): 20 mg via ORAL
  Filled 2021-03-28 (×3): qty 1

## 2021-03-28 MED ORDER — CALCIUM POLYCARBOPHIL 625 MG PO TABS
625.0000 mg | ORAL_TABLET | Freq: Two times a day (BID) | ORAL | Status: DC
  Administered 2021-03-28 – 2021-03-29 (×3): 625 mg via ORAL
  Filled 2021-03-28 (×3): qty 1

## 2021-03-28 MED ORDER — LIDOCAINE HCL 2 % IJ SOLN
INTRAMUSCULAR | Status: AC
  Filled 2021-03-28: qty 20

## 2021-03-28 MED ORDER — LACTATED RINGERS IV SOLN
INTRAVENOUS | Status: DC
  Administered 2021-03-28: 1000 mL via INTRAVENOUS

## 2021-03-28 MED ORDER — KETAMINE HCL 10 MG/ML IJ SOLN
INTRAMUSCULAR | Status: DC | PRN
Start: 2021-03-28 — End: 2021-03-28
  Administered 2021-03-28: 30 mg via INTRAVENOUS

## 2021-03-28 MED ORDER — ONDANSETRON HCL 4 MG PO TABS: 4.0000 mg | ORAL_TABLET | Freq: Four times a day (QID) | ORAL | Status: DC | PRN

## 2021-03-28 MED ORDER — DEXAMETHASONE SODIUM PHOSPHATE 10 MG/ML IJ SOLN
INTRAMUSCULAR | Status: DC | PRN
  Administered 2021-03-28: 10 mg via INTRAVENOUS

## 2021-03-28 MED ORDER — ROCURONIUM BROMIDE 10 MG/ML (PF) SYRINGE
PREFILLED_SYRINGE | INTRAVENOUS | Status: DC | PRN
  Administered 2021-03-28: 20 mg via INTRAVENOUS
  Administered 2021-03-28: 60 mg via INTRAVENOUS
  Administered 2021-03-28: 20 mg via INTRAVENOUS

## 2021-03-28 MED ORDER — MIDAZOLAM HCL 2 MG/2ML IJ SOLN
INTRAMUSCULAR | Status: AC
  Filled 2021-03-28: qty 2

## 2021-03-28 MED ORDER — SODIUM CHLORIDE 0.9 % IV SOLN
2.0000 g | INTRAVENOUS | Status: AC
  Administered 2021-03-28: 2 g via INTRAVENOUS
  Filled 2021-03-28: qty 2

## 2021-03-28 MED ORDER — ACETAMINOPHEN 500 MG PO TABS
1000.0000 mg | ORAL_TABLET | Freq: Four times a day (QID) | ORAL | Status: DC
  Administered 2021-03-28 – 2021-03-31 (×11): 1000 mg via ORAL
  Filled 2021-03-28 (×11): qty 2

## 2021-03-28 MED ORDER — SODIUM CHLORIDE 0.9 % IV SOLN: 250.0000 mL | INTRAVENOUS | Status: DC | PRN

## 2021-03-28 MED ORDER — ALVIMOPAN 12 MG PO CAPS
12.0000 mg | ORAL_CAPSULE | Freq: Two times a day (BID) | ORAL | Status: DC
  Administered 2021-03-29 – 2021-03-30 (×3): 12 mg via ORAL
  Filled 2021-03-28 (×3): qty 1

## 2021-03-28 MED ORDER — PANTOPRAZOLE SODIUM 40 MG PO TBEC
40.0000 mg | DELAYED_RELEASE_TABLET | Freq: Every day | ORAL | Status: DC
  Administered 2021-03-29 – 2021-03-30 (×2): 40 mg via ORAL
  Filled 2021-03-28 (×2): qty 1

## 2021-03-28 MED ORDER — BISACODYL 5 MG PO TBEC: 20.0000 mg | DELAYED_RELEASE_TABLET | Freq: Once | ORAL | Status: DC

## 2021-03-28 MED ORDER — ONDANSETRON HCL 4 MG/2ML IJ SOLN
INTRAMUSCULAR | Status: AC
  Filled 2021-03-28: qty 2

## 2021-03-28 MED ORDER — DIPHENHYDRAMINE HCL 12.5 MG/5ML PO ELIX: 12.5000 mg | ORAL_SOLUTION | Freq: Four times a day (QID) | ORAL | Status: DC | PRN

## 2021-03-28 MED ORDER — ENOXAPARIN SODIUM 40 MG/0.4ML IJ SOSY
40.0000 mg | PREFILLED_SYRINGE | Freq: Once | INTRAMUSCULAR | Status: AC
  Administered 2021-03-28: 40 mg via SUBCUTANEOUS
  Filled 2021-03-28: qty 0.4

## 2021-03-28 MED ORDER — FENTANYL CITRATE (PF) 250 MCG/5ML IJ SOLN
INTRAMUSCULAR | Status: AC
  Filled 2021-03-28: qty 5

## 2021-03-28 MED ORDER — BUPIVACAINE LIPOSOME 1.3 % IJ SUSP: 20.0000 mL | Freq: Once | INTRAMUSCULAR | Status: DC

## 2021-03-28 MED ORDER — CHLORHEXIDINE GLUCONATE 0.12 % MT SOLN
15.0000 mL | Freq: Once | OROMUCOSAL | Status: AC
  Administered 2021-03-28: 15 mL via OROMUCOSAL

## 2021-03-28 MED ORDER — METRONIDAZOLE 500 MG PO TABS: 1000.0000 mg | ORAL_TABLET | ORAL | Status: DC

## 2021-03-28 MED ORDER — ALUM & MAG HYDROXIDE-SIMETH 200-200-20 MG/5ML PO SUSP: 30.0000 mL | Freq: Four times a day (QID) | ORAL | Status: DC | PRN

## 2021-03-28 MED ORDER — BUPIVACAINE-EPINEPHRINE (PF) 0.25% -1:200000 IJ SOLN
INTRAMUSCULAR | Status: DC | PRN
  Administered 2021-03-28: 30 mL

## 2021-03-28 MED ORDER — PROPOFOL 10 MG/ML IV BOLUS
INTRAVENOUS | Status: DC | PRN
  Administered 2021-03-28: 170 mg via INTRAVENOUS

## 2021-03-28 MED ORDER — ONDANSETRON HCL 4 MG/2ML IJ SOLN: INTRAMUSCULAR | Status: DC | PRN

## 2021-03-28 MED ORDER — ACETAMINOPHEN 500 MG PO TABS
1000.0000 mg | ORAL_TABLET | ORAL | Status: AC
  Administered 2021-03-28: 1000 mg via ORAL
  Filled 2021-03-28: qty 2

## 2021-03-28 MED ORDER — LIP MEDEX EX OINT
1.0000 "application " | TOPICAL_OINTMENT | Freq: Two times a day (BID) | CUTANEOUS | Status: DC
  Administered 2021-03-28 – 2021-03-30 (×5): 1 via TOPICAL
  Filled 2021-03-28 (×3): qty 7

## 2021-03-28 MED ORDER — SUGAMMADEX SODIUM 200 MG/2ML IV SOLN
INTRAVENOUS | Status: DC | PRN
  Administered 2021-03-28: 200 mg via INTRAVENOUS

## 2021-03-28 MED ORDER — ENALAPRILAT 1.25 MG/ML IV SOLN
0.6250 mg | Freq: Four times a day (QID) | INTRAVENOUS | Status: DC | PRN
  Filled 2021-03-28: qty 1

## 2021-03-28 MED ORDER — SODIUM CHLORIDE 0.9 % IR SOLN
Status: DC | PRN
  Administered 2021-03-28: 1000 mL

## 2021-03-28 MED ORDER — 0.9 % SODIUM CHLORIDE (POUR BTL) OPTIME
TOPICAL | Status: DC | PRN
  Administered 2021-03-28: 2000 mL

## 2021-03-28 MED ORDER — ORAL CARE MOUTH RINSE: 15.0000 mL | Freq: Once | OROMUCOSAL | Status: AC

## 2021-03-28 MED ORDER — METHYLENE BLUE 0.5 % INJ SOLN
INTRAVENOUS | Status: AC
  Filled 2021-03-28: qty 10

## 2021-03-28 MED ORDER — LIDOCAINE HCL (PF) 2 % IJ SOLN
INTRAMUSCULAR | Status: AC
  Filled 2021-03-28: qty 5

## 2021-03-28 MED ORDER — DIPHENHYDRAMINE HCL 50 MG/ML IJ SOLN: 12.5000 mg | Freq: Four times a day (QID) | INTRAMUSCULAR | Status: DC | PRN

## 2021-03-28 MED ORDER — ENSURE PRE-SURGERY PO LIQD: 296.0000 mL | Freq: Once | ORAL | Status: DC

## 2021-03-28 MED ORDER — LACTATED RINGERS IV BOLUS: 1000.0000 mL | Freq: Three times a day (TID) | INTRAVENOUS | Status: AC | PRN

## 2021-03-28 MED ORDER — ROCURONIUM BROMIDE 10 MG/ML (PF) SYRINGE
PREFILLED_SYRINGE | INTRAVENOUS | Status: AC
  Filled 2021-03-28: qty 10

## 2021-03-28 MED ORDER — PROCHLORPERAZINE EDISYLATE 10 MG/2ML IJ SOLN: 5.0000 mg | Freq: Four times a day (QID) | INTRAMUSCULAR | Status: DC | PRN

## 2021-03-28 MED ORDER — MAGIC MOUTHWASH
15.0000 mL | Freq: Four times a day (QID) | ORAL | Status: DC | PRN
  Filled 2021-03-28: qty 15

## 2021-03-28 MED ORDER — ONDANSETRON HCL 4 MG/2ML IJ SOLN
4.0000 mg | Freq: Four times a day (QID) | INTRAMUSCULAR | Status: DC | PRN
  Administered 2021-03-28: 4 mg via INTRAVENOUS
  Filled 2021-03-28: qty 2

## 2021-03-28 MED ORDER — PROCHLORPERAZINE MALEATE 10 MG PO TABS
10.0000 mg | ORAL_TABLET | Freq: Four times a day (QID) | ORAL | Status: DC | PRN
  Filled 2021-03-28: qty 1

## 2021-03-28 MED ORDER — BUPIVACAINE LIPOSOME 1.3 % IJ SUSP
INTRAMUSCULAR | Status: DC | PRN
  Administered 2021-03-28: 20 mL

## 2021-03-28 SURGICAL SUPPLY — 111 items
APPLIER CLIP 5 13 M/L LIGAMAX5 (MISCELLANEOUS)
APPLIER CLIP ROT 10 11.4 M/L (STAPLE)
BAG COUNTER SPONGE SURGICOUNT (BAG) ×3 IMPLANT
BLADE EXTENDED COATED 6.5IN (ELECTRODE) IMPLANT
CANNULA REDUC XI 12-8 STAPL (CANNULA)
CANNULA REDUCER 12-8 DVNC XI (CANNULA) IMPLANT
CELLS DAT CNTRL 66122 CELL SVR (MISCELLANEOUS) IMPLANT
CHLORAPREP W/TINT 26 (MISCELLANEOUS) ×1 IMPLANT
CLIP APPLIE 5 13 M/L LIGAMAX5 (MISCELLANEOUS) IMPLANT
CLIP APPLIE ROT 10 11.4 M/L (STAPLE) IMPLANT
COVER SURGICAL LIGHT HANDLE (MISCELLANEOUS) ×6 IMPLANT
COVER TIP SHEARS 8 DVNC (MISCELLANEOUS) ×2 IMPLANT
COVER TIP SHEARS 8MM DA VINCI (MISCELLANEOUS) ×3
DEVICE TROCAR PUNCTURE CLOSURE (ENDOMECHANICALS) IMPLANT
DRAIN CHANNEL 19F RND (DRAIN) ×1 IMPLANT
DRAPE ARM DVNC X/XI (DISPOSABLE) ×8 IMPLANT
DRAPE COLUMN DVNC XI (DISPOSABLE) ×2 IMPLANT
DRAPE DA VINCI XI ARM (DISPOSABLE) ×12
DRAPE DA VINCI XI COLUMN (DISPOSABLE) ×3
DRAPE SURG IRRIG POUCH 19X23 (DRAPES) ×3 IMPLANT
DRSG OPSITE POSTOP 4X10 (GAUZE/BANDAGES/DRESSINGS) IMPLANT
DRSG OPSITE POSTOP 4X6 (GAUZE/BANDAGES/DRESSINGS) ×1 IMPLANT
DRSG OPSITE POSTOP 4X8 (GAUZE/BANDAGES/DRESSINGS) IMPLANT
DRSG TEGADERM 2-3/8X2-3/4 SM (GAUZE/BANDAGES/DRESSINGS) ×15 IMPLANT
DRSG TEGADERM 4X4.75 (GAUZE/BANDAGES/DRESSINGS) ×1 IMPLANT
ELECT PENCIL ROCKER SW 15FT (MISCELLANEOUS) ×3 IMPLANT
ELECT REM PT RETURN 15FT ADLT (MISCELLANEOUS) ×3 IMPLANT
ENDOLOOP SUT PDS II  0 18 (SUTURE)
ENDOLOOP SUT PDS II 0 18 (SUTURE) IMPLANT
EVACUATOR SILICONE 100CC (DRAIN) ×1 IMPLANT
GAUZE SPONGE 2X2 8PLY STRL LF (GAUZE/BANDAGES/DRESSINGS) ×2 IMPLANT
GLOVE SURG NEOPR MICRO LF SZ8 (GLOVE) ×9 IMPLANT
GLOVE SURG UNDER LTX SZ8 (GLOVE) ×9 IMPLANT
GOWN STRL REUS W/TWL XL LVL3 (GOWN DISPOSABLE) ×9 IMPLANT
GRASPER SUT TROCAR 14GX15 (MISCELLANEOUS) IMPLANT
HOLDER FOLEY CATH W/STRAP (MISCELLANEOUS) ×3 IMPLANT
IRRIG SUCT STRYKERFLOW 2 WTIP (MISCELLANEOUS) ×3
IRRIGATION SUCT STRKRFLW 2 WTP (MISCELLANEOUS) ×2 IMPLANT
KIT PROCEDURE DA VINCI SI (MISCELLANEOUS) ×3
KIT PROCEDURE DVNC SI (MISCELLANEOUS) IMPLANT
KIT SIGMOIDOSCOPE (SET/KITS/TRAYS/PACK) ×1 IMPLANT
KIT TURNOVER KIT A (KITS) ×1 IMPLANT
NDL INSUFFLATION 14GA 120MM (NEEDLE) ×2 IMPLANT
NEEDLE INSUFFLATION 14GA 120MM (NEEDLE) ×3 IMPLANT
PACK CARDIOVASCULAR III (CUSTOM PROCEDURE TRAY) ×3 IMPLANT
PACK COLON (CUSTOM PROCEDURE TRAY) ×3 IMPLANT
PAD POSITIONING PINK XL (MISCELLANEOUS) ×3 IMPLANT
PROTECTOR NERVE ULNAR (MISCELLANEOUS) ×5 IMPLANT
RELOAD STAPLE 45 3.5 BLU DVNC (STAPLE) IMPLANT
RELOAD STAPLE 45 4.3 GRN DVNC (STAPLE) IMPLANT
RELOAD STAPLE 60 3.5 BLU DVNC (STAPLE) IMPLANT
RELOAD STAPLE 60 4.3 GRN DVNC (STAPLE) IMPLANT
RELOAD STAPLER 3.5X45 BLU DVNC (STAPLE) IMPLANT
RELOAD STAPLER 3.5X60 BLU DVNC (STAPLE) ×2 IMPLANT
RELOAD STAPLER 4.3X45 GRN DVNC (STAPLE) IMPLANT
RELOAD STAPLER 4.3X60 GRN DVNC (STAPLE) IMPLANT
RETRACTOR WND ALEXIS 18 MED (MISCELLANEOUS) IMPLANT
RTRCTR WOUND ALEXIS 18CM MED (MISCELLANEOUS)
SCISSORS LAP 5X35 DISP (ENDOMECHANICALS) ×3 IMPLANT
SEAL CANN UNIV 5-8 DVNC XI (MISCELLANEOUS) ×6 IMPLANT
SEAL XI 5MM-8MM UNIVERSAL (MISCELLANEOUS) ×12
SEALER VESSEL DA VINCI XI (MISCELLANEOUS) ×3
SEALER VESSEL EXT DVNC XI (MISCELLANEOUS) ×2 IMPLANT
SOLUTION ELECTROLUBE (MISCELLANEOUS) ×3 IMPLANT
SPIKE FLUID TRANSFER (MISCELLANEOUS) ×3 IMPLANT
SPONGE GAUZE 2X2 STER 10/PKG (GAUZE/BANDAGES/DRESSINGS) ×1
STAPLER 45 DA VINCI SURE FORM (STAPLE)
STAPLER 45 SUREFORM DVNC (STAPLE) IMPLANT
STAPLER 60 DA VINCI SURE FORM (STAPLE) ×3
STAPLER 60 SUREFORM DVNC (STAPLE) IMPLANT
STAPLER CANNULA SEAL DVNC XI (STAPLE) ×2 IMPLANT
STAPLER CANNULA SEAL XI (STAPLE) ×3
STAPLER ECHELON POWER CIR 29 (STAPLE) IMPLANT
STAPLER ECHELON POWER CIR 31 (STAPLE) ×1 IMPLANT
STAPLER RELOAD 3.5X45 BLU DVNC (STAPLE)
STAPLER RELOAD 3.5X45 BLUE (STAPLE)
STAPLER RELOAD 3.5X60 BLU DVNC (STAPLE) ×2
STAPLER RELOAD 3.5X60 BLUE (STAPLE) ×3
STAPLER RELOAD 4.3X45 GREEN (STAPLE)
STAPLER RELOAD 4.3X45 GRN DVNC (STAPLE)
STAPLER RELOAD 4.3X60 GREEN (STAPLE)
STAPLER RELOAD 4.3X60 GRN DVNC (STAPLE)
STOPCOCK 4 WAY LG BORE MALE ST (IV SETS) ×6 IMPLANT
SURGILUBE 2OZ TUBE FLIPTOP (MISCELLANEOUS) ×1 IMPLANT
SUT MNCRL AB 4-0 PS2 18 (SUTURE) ×3 IMPLANT
SUT PDS AB 1 CT1 27 (SUTURE) ×8 IMPLANT
SUT PROLENE 0 CT 2 (SUTURE) IMPLANT
SUT PROLENE 2 0 KS (SUTURE) ×1 IMPLANT
SUT PROLENE 2 0 SH DA (SUTURE) ×1 IMPLANT
SUT SILK 2 0 (SUTURE)
SUT SILK 2 0 SH CR/8 (SUTURE) IMPLANT
SUT SILK 2-0 18XBRD TIE 12 (SUTURE) IMPLANT
SUT SILK 3 0 (SUTURE)
SUT SILK 3 0 SH CR/8 (SUTURE) ×3 IMPLANT
SUT SILK 3-0 18XBRD TIE 12 (SUTURE) IMPLANT
SUT V-LOC BARB 180 2/0GR6 GS22 (SUTURE)
SUT VIC AB 3-0 SH 18 (SUTURE) IMPLANT
SUT VIC AB 3-0 SH 27 (SUTURE)
SUT VIC AB 3-0 SH 27XBRD (SUTURE) IMPLANT
SUT VICRYL 0 UR6 27IN ABS (SUTURE) ×4 IMPLANT
SUTURE V-LC BRB 180 2/0GR6GS22 (SUTURE) IMPLANT
SYR 10ML ECCENTRIC (SYRINGE) ×3 IMPLANT
SYS LAPSCP GELPORT 120MM (MISCELLANEOUS)
SYS WOUND ALEXIS 18CM MED (MISCELLANEOUS) ×3
SYSTEM LAPSCP GELPORT 120MM (MISCELLANEOUS) IMPLANT
SYSTEM WOUND ALEXIS 18CM MED (MISCELLANEOUS) ×2 IMPLANT
TOWEL OR NON WOVEN STRL DISP B (DISPOSABLE) ×3 IMPLANT
TRAY FOLEY MTR SLVR 16FR STAT (SET/KITS/TRAYS/PACK) ×3 IMPLANT
TROCAR ADV FIXATION 5X100MM (TROCAR) ×3 IMPLANT
TUBING CONNECTING 10 (TUBING) ×6 IMPLANT
TUBING INSUFFLATION 10FT LAP (TUBING) ×3 IMPLANT

## 2021-03-28 NOTE — Interval H&P Note (Signed)
History and Physical Interval Note:  03/28/2021 8:36 AM  Zachary Montoya  has presented today for surgery, with the diagnosis of diverticulitis.  The various methods of treatment have been discussed with the patient and family. After consideration of risks, benefits and other options for treatment, the patient has consented to  Procedure(s): ROBOTIC RESECTION OF COLON, DESCENDING AND SIGMOID (N/A) POSSIBLE PRIMARY REPAIR UMBILICAL HERNIA (N/A) RIGID PROCTOSCOPY (N/A) as a surgical intervention.  The patient's history has been reviewed, patient examined, no change in status, stable for surgery.  I have reviewed the patient's chart and labs.  Questions were answered to the patient's satisfaction.    I have re-reviewed the the patient's records, history, medications, and allergies.  I have re-examined the patient.  I again discussed intraoperative plans and goals of post-operative recovery.  The patient agrees to proceed.  Zachary Montoya  [birth year] [id number]  Patient Care Team: Crist Infante, MD as PCP - General (Internal Medicine) Constance Haw, MD as PCP - Electrophysiology (Cardiology) Warren Danes, PA-C as Physician Assistant (Dermatology) Michael Boston, MD as Consulting Physician (Colon and Rectal Surgery) Clarene Essex, MD as Consulting Physician (Gastroenterology) Peri Jefferson, FNP (Pulmonary Disease) Jolene Provost, PA-C as Physician Assistant (Otolaryngology) Jettie Booze, MD as Consulting Physician (Cardiology)  Patient Active Problem List   Diagnosis Date Noted   Diverticulitis 12/21/2020   Diverticulitis of intestine with abscess 12/21/2020   Presence of Watchman left atrial appendage closure device    Syncope 03/28/2020   Essential hypertension    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   Elevated blood pressure reading without diagnosis of hypertension 06/07/2014   Hyperlipidemia 06/07/2014   Paroxysmal atrial fibrillation ([place])  06/07/2014    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   Right Scaphoid Fossa   PAF (paroxysmal atrial fibrillation) (HCC)    SCCA (squamous cell carcinoma) of skin 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   Left Posterior Neck   Sleep apnea     Past Surgical History:  Procedure Laterality Date   APPENDECTOMY     COLONOSCOPY WITH PROPOFOL N/A 03/27/2021   Procedure: COLONOSCOPY WITH PROPOFOL;  Surgeon: Clarene Essex, MD;  Location: [facility];  Service: Endoscopy;  Laterality: N/A;   ESOPHAGOGASTRIC FUNDOPLICATION     LEFT ATRIAL APPENDAGE OCCLUSION N/A 10/26/2020   27 mm Watchman left atrial appendage occlusion device   POLYPECTOMY  03/27/2021   Procedure: POLYPECTOMY;  Surgeon: Clarene Essex, MD;  Location: [facility];  Service: Endoscopy;;   TEE WITHOUT CARDIOVERSION N/A 10/26/2020   Procedure: TRANSESOPHAGEAL ECHOCARDIOGRAM (TEE);  Surgeon: Vickie Epley, MD;  Location: [facility];  Service: Cardiovascular;  Laterality: N/A;   TEE WITHOUT CARDIOVERSION N/A 12/07/2020   Procedure: TRANSESOPHAGEAL ECHOCARDIOGRAM (TEE);  Surgeon: Elouise Munroe, MD;  Location: [facility];  Service: Cardiology;  Laterality: N/A;    Social History   Socioeconomic History   Marital status: Married    Spouse name: Not on file   Number of children: Not on file   Years of education: Not on file   Highest education level: Not on file  Occupational History   Not on file  Tobacco Use   Smoking status: Never   Smokeless tobacco: Never  Vaping Use   Vaping Use: Never used  Substance and  Sexual Activity   Alcohol use: Yes    Alcohol/week: 1.0 standard drink    Types: 1 Cans of beer per week    Comment: occas.   Drug use: No   Sexual activity: Not on file   Other Topics Concern   Not on file  Social History Narrative   Not on file   Social Determinants of Health   Financial Resource Strain: Not on file  Food Insecurity: Not on file  Transportation Needs: Not on file  Physical Activity: Not on file  Stress: Not on file  Social Connections: Not on file  Intimate Partner Violence: Not on file    Family History  Problem Relation Age of Onset   Diabetes Mother    Hypertension Mother    Stroke Mother    Cancer Brother    Heart attack Neg Hx     Medications Prior to Admission  Medication Sig Dispense Refill Last Dose   acetaminophen (TYLENOL) 650 MG CR tablet Take 650-1,300 mg by mouth every 8 (eight) hours as needed for pain.      aspirin 81 MG chewable tablet Chew 1 tablet (81 mg total) by mouth daily. (Patient taking differently: Chew 81 mg by mouth every evening.)      atorvastatin (LIPITOR) 20 MG tablet Take 20 mg by mouth daily.  3    B Complex-C (B-COMPLEX WITH VITAMIN C) tablet Take 1 tablet by mouth daily.      cetirizine (ZYRTEC) 10 MG tablet Take 10 mg by mouth daily.      cholecalciferol (VITAMIN D) 25 MCG (1000 UNIT) tablet Take 1,000 Units by mouth daily.      clopidogrel (PLAVIX) 75 MG tablet Take 1 tablet (75 mg total) by mouth daily. (Patient taking differently: Take 75 mg by mouth every evening.) 90 tablet 1    Coenzyme Q10 (COQ10) 200 MG CAPS Take 200 mg by mouth daily.      COVID-19 mRNA bivalent vaccine, Pfizer, (PFIZER COVID-19 VAC BIVALENT) injection Inject into the muscle. (Patient not taking: Reported on 03/05/2021) 0.3 mL 0    ferrous sulfate 325 (65 FE) MG tablet Take 325 mg by mouth daily.      flecainide (TAMBOCOR) 100 MG tablet Take 1 tablet (100 mg total) by mouth 2 (two) times daily. 180 tablet 1    influenza vaccine adjuvanted (FLUAD QUADRIVALENT) 0.5 ML injection Inject into the muscle. (Patient not taking: Reported on 03/05/2021) 0.5 mL 0    metoprolol succinate (TOPROL-XL) 25 MG 24 hr tablet Take 1  tablet (25 mg total) by mouth daily. (Patient taking differently: Take 25 mg by mouth at bedtime.) 90 tablet 1    Multiple Vitamins-Minerals (CENTRUM ADULTS PO) Take 1 tablet by mouth daily.      Omega 3 1000 MG CAPS Take 1,000 mg by mouth daily.      pantoprazole (PROTONIX) 40 MG tablet Take 1 tablet (40 mg total) by mouth daily. 90 tablet 1    polyethylene glycol (MIRALAX / GLYCOLAX) 17 g packet Take 17 g by mouth daily.      triamcinolone (NASACORT) 55 MCG/ACT AERO nasal inhaler Place 1 spray into the nose daily as needed (allergies).       Current Facility-Administered Medications  Medication Dose Route Frequency Provider Last Rate Last Admin   acetaminophen (TYLENOL) tablet 1,000 mg  1,000 mg Oral On Call to OR Michael Boston, MD       alvimopan (ENTEREG) capsule 12 mg  12 mg Oral On Call  to OR Emmaclaire Switala, Remo Lipps, MD       bisacodyl (DULCOLAX) EC tablet 20 mg  20 mg Oral Once Michael Boston, MD       bupivacaine liposome (EXPAREL) 1.3 % injection 266 mg  20 mL Infiltration Once Michael Boston, MD       cefoTEtan (CEFOTAN) 2 g in sodium chloride 0.9 % 100 mL IVPB  2 g Intravenous On Call to OR Michael Boston, MD       chlorhexidine (PERIDEX) 0.12 % solution 15 mL  15 mL Mouth/Throat Once Roderic Palau, MD       Or   MEDLINE mouth rinse  15 mL Mouth Rinse Once Roderic Palau, MD       enoxaparin (LOVENOX) injection 40 mg  40 mg Subcutaneous Once Michael Boston, MD       Derrill Memo ON 03/29/2021] feeding supplement (ENSURE PRE-SURGERY) liquid 296 mL  296 mL Oral Once Michael Boston, MD       feeding supplement (ENSURE PRE-SURGERY) liquid 592 mL  592 mL Oral Once Michael Boston, MD       gabapentin (NEURONTIN) capsule 300 mg  300 mg Oral On Call to OR Michael Boston, MD       lactated ringers infusion   Intravenous Continuous Roderic Palau, MD       neomycin [facility]) tablet 1,000 mg  1,000 mg Oral 3 times per day Michael Boston, MD       And   metroNIDAZOLE (FLAGYL) tablet 1,000 mg  1,000  mg Oral 3 times per day Michael Boston, MD       polyethylene glycol powder (GLYCOLAX/MIRALAX) container 255 g  1 Container Oral Once Michael Boston, MD         Allergies  Allergen Reactions   Timolol     Bradycardia/dizziness    Bee Venom Itching and Swelling   Brimonidine Tartrate Other (See Comments)    Burning, itching, and discomfort    Codeine Nausea And Vomiting    Ht 5\' 8"  (1.727 m)    Wt 90.3 kg    BMI 30.27 kg/m   Labs: No results found for this or any previous visit (from the past 31 hour(s)).  Imaging / Studies: No results found.   Adin Hector, M.D., F.A.C.S. Gastrointestinal and Minimally Invasive Surgery [facility] [address] [place], [place] [address] [phone] Main / Paging  03/28/2021 8:37 AM    Adin Hector

## 2021-03-28 NOTE — Anesthesia Procedure Notes (Signed)
Procedure Name: Intubation Date/Time: 03/28/2021 9:48 AM Performed by: Maxwell Caul, CRNA Pre-anesthesia Checklist: Patient identified, Emergency Drugs available, Suction available and Patient being monitored Patient Re-evaluated:Patient Re-evaluated prior to induction Oxygen Delivery Method: Circle system utilized Preoxygenation: Pre-oxygenation with 100% oxygen Induction Type: IV induction Ventilation: Mask ventilation without difficulty Laryngoscope Size: Mac and 4 Grade View: Grade I Tube type: Oral Tube size: 7.5 mm Number of attempts: 1 Airway Equipment and Method: Stylet and Oral airway Placement Confirmation: ETT inserted through vocal cords under direct vision, positive ETCO2 and breath sounds checked- equal and bilateral Secured at: 21 cm Tube secured with: Tape Dental Injury: Teeth and Oropharynx as per pre-operative assessment

## 2021-03-28 NOTE — Transfer of Care (Signed)
Immediate Anesthesia Transfer of Care Note  Patient: Zachary Anton Coxe  Procedure(s) Performed: ROBOTIC RESECTION OF COLON, DESCENDING AND SIGMOID, INTRAOPERATIVE ASSESSMENT OF PERFUSION USING FIREFLY, BILATERAL TAP BLOCK, BLADDER REPAIR. (Abdomen) PRIMARY REPAIR INCERCERATED UMBILICAL HERNIA 2CM (Abdomen) RIGID PROCTOSCOPY  Patient Location: PACU  Anesthesia Type:General  Level of Consciousness: awake, alert  and oriented  Airway & Oxygen Therapy: Patient Spontanous Breathing and Patient connected to face mask oxygen  Post-op Assessment: Report given to RN and Post -op Vital signs reviewed and stable  Post vital signs: Reviewed and stable  Last Vitals:  Vitals Value Taken Time  BP 159/87 03/28/21 1305  Temp    Pulse 68 03/28/21 1307  Resp 13 03/28/21 1307  SpO2 100 % 03/28/21 1307  Vitals shown include unvalidated device data.  Last Pain:  Vitals:   03/28/21 0856  TempSrc:   PainSc: 0-No pain         Complications: No notable events documented.

## 2021-03-28 NOTE — Progress Notes (Signed)
Pt prefers to use his cpap from home.  Pt does not need assistance with it tonight.  Machine plugged into red outlet.

## 2021-03-28 NOTE — H&P (Signed)
03/28/2021    REFERRING PHYSICIAN: Magod, Glade Stanford, MD  Patient Care Team: Jerlyn Ly, MD as PCP - General (Internal Medicine) Magod, Glade Stanford, MD (Gastroenterology) Elouise Munroe, MD (Cardiovascular Disease)  PROVIDER: Hollace Kinnier, MD  [facility] MRN: [id number] DOB: January 12, 1946   SUBJECTIVE   Chief Complaint:Diverticulitis  History of Present Illness: Zachary Montoya is a 76 y.o. male who is seen today  as an office consultation at the request of Dr. Watt Climes  for evaluation of diverticulitis.   76 year old male. History of cardiac disease fully anticoagulated for atrial fibrillation followed by Cardiology [facility]. Has had episodes of abdominal pain with CAT scan diagnosed diverticulitis. Was admitted in October for couple of days with a focal area suspicious of small abscess. Clinically improved and transition to oral antibiotics. Discussion recommendation for surgical follow-up given repeated attacks. Initial request made by PCP Dr. Joylene Draft but our office did not see the request made to see [place] until December. Apparently patient did go to [facility] gastroenterology. Usually managed by Dr. Cristina Gong but more recently saw Dr. Watt Climes.  Patient has complicated cardiac issues. Atrial fibrillation currently aggravated on Plavix. Have a left atrial appendage closure using a intraluminal watchman device placed in September without any major issues. He does have sleep apnea. Is obese. Hypertension, hypercholesterolemia. Had an appendectomy when he was much younger. Had an open Nissen fundoplication done late 5681E by Dr. Dalbert Batman. Has a small incisional hernia around his bellybutton.  He does have sleep apnea controlled with CPAP. He can walk 20-30 minutes without difficulty. Moves his bowels about every other day. History of colon polyps. Last colonoscopy about 5 years ago. He thinks he is about due for follow-up. He notes he has not been able to get off of antibiotics for more than a couple  weeks. He has had 5 attacks of diverticulitis this year. Usually in the same left lower side. Usually feels abdominal pain cramping and some low-grade fevers.  He is close friends with former dear neighbors of mine.  Colonoscopy done yesterday.  Tolerated and underwhelming.    Medical History:  No past medical history on file.  Patient Active Problem List  Diagnosis   Allergic rhinitis   Anemia   Neuralgia   Diverticular disease of colon   Diverticulitis of intestine with abscess   Elevated blood pressure reading without diagnosis of hypertension   Essential hypertension   Gastro-esophageal reflux disease without esophagitis   Frequency of micturition   Hearing loss   Hyperlipidemia   Impaired fasting glucose   Left temporal headache   Obstructive sleep apnea (adult) (pediatric)   Paroxysmal atrial fibrillation (CMS-HCC)   Presence of Watchman left atrial appendage closure device   Primary open angle glaucoma (POAG) of left eye, severe stage   Primary open angle glaucoma (POAG) of right eye, severe stage   Pure hyperglyceridemia   Sensorineural hearing loss, bilateral   Unspecified visual loss   Tinnitus   No past surgical history on file.   Allergies  Allergen Reactions   Timolol Other (See Comments)  Bradycardia/dizziness Bradycardia/dizziness   No current outpatient medications on file prior to visit.   No current facility-administered medications on file prior to visit.   No family history on file.   Social History   Tobacco Use  Smoking Status Not on file  Smokeless Tobacco Not on file    Social History   Socioeconomic History   Marital status: Married   ############################################################  Review of Systems: A complete  review of systems (ROS) was obtained from the patient. I have reviewed this information and discussed as appropriate with the patient. See HPI as well for other pertinent ROS.  Constitutional: No fevers,  chills, sweats. Weight stable Eyes: No vision changes, No discharge HENT: No sore throats, nasal drainage Lymph: No neck swelling, No bruising easily Pulmonary: No cough, productive sputum CV: No orthopnea, PND Patient walks 20 minutes without difficulty. No exertional chest/neck/shoulder/arm pain.  GI: No personal nor family history of GI/colon cancer, inflammatory bowel disease, irritable bowel syndrome, allergy such as Celiac Sprue, dietary/dairy problems, colitis, ulcers nor gastritis. No recent sick contacts/gastroenteritis. No travel outside the country. No changes in diet.  Renal: No UTIs, No hematuria Genital: No drainage, bleeding, masses Musculoskeletal: No severe joint pain. Good ROM major joints Skin: No sores or lesions Heme/Lymph: No easy bleeding. No swollen lymph nodes  OBJECTIVE   Vitals:  02/06/21 0859  BP: 128/82  Pulse: 60  Weight: 90.9 kg (200 lb 6.4 oz)  Height: 172.7 cm (5\' 8" )    Body mass index is 30.47 kg/m.  PHYSICAL EXAM:  Constitutional: Not cachectic. Hygeine adequate. Vitals signs as above.  Eyes: Pupils reactive, normal extraocular movements. Sclera nonicteric Neuro: CN II-XII intact. No major focal sensory defects. No major motor deficits. Lymph: No head/neck/groin lymphadenopathy Psych: No severe agitation. No severe anxiety. Judgment & insight Adequate, Oriented x4, HENT: Normocephalic, Mucus membranes moist. No thrush.  Neck: Supple, No tracheal deviation. No obvious thyromegaly Chest: No pain to chest wall compression. Good respiratory excursion. No audible wheezing CV: Pulses intact. Regular rhythm. No major extremity edema Ext: No obvious deformity or contracture. Edema: Not present. No cyanosis Skin: No major subcutaneous nodules. Warm and dry Musculoskeletal: Severe joint rigidity not present. No obvious clubbing. No digital petechiae.   Abdomen:  Obese Hernia: Present at: Umbilicus, size 4M2NO. Diastasis recti: Mild supraumbilical  midline.  Soft. Nondistended. Nontender. No hepatomegaly. No splenomegaly  Genital/Pelvic: Inguinal hernia: Not present. Inguinal lymph nodes: without lymphadenopathy.   Rectal: (Deferred)    ###################################################################  Labs, Imaging and Diagnostic Testing:  Located in 'Care Everywhere' section of Epic EMR chart  PRIOR [facility] CLINIC NOTES:  Not applicable  SURGERY NOTES:  Not applicable  PATHOLOGY:  Not applicable  Assessment and Plan:  DIAGNOSES:  Diagnoses and all orders for this visit:  Diverticulitis of large intestine with abscess without bleeding  Paroxysmal atrial fibrillation (CMS-HCC)  Chronic anticoagulation  Obstructive sleep apnea (adult) (pediatric)  Presence of Watchman left atrial appendage closure device    ASSESSMENT/PLAN  Patient with recurrent episodes of diverticulitis documented by numerous CAT scans at least since 2015. Increasing frequency of attacks. Recent hospitalization with probable small contained abscess. Focus at descending/sigmoid colon junction.  I think he would benefit from segmental colonic resection. Ideally cooldown this attack and perform a robotic descending/sigmoid resection with anastomosis.  Colonoscopy done yesterday by Dr. Watt Climes appears underwhelming.  Diverticulosis.  Few small polyps removed.  No major tumors  He has a small periumbilical hernia. He wishes that I fix that. I will do primarily repair.  I cautioned that suture repair is not as successful as mesh, but it is not a safe idea to do mesh repair at the same time. Asked him to remind [place].  I do not think his attacks have been so severe that it rewards firefly preoperatively by urology. Questionable abscess was very anterior on the anterior domino wall. Not running along the ureters  Given his complicated cardiac history with  atrial fibrillation and recent need for watchman placement device for left atrial appendiceal  closure, we requested clearance.  Patient cleared by cardiology.  Plavix being held 5 days.  Adin Hector, MD, FACS, MASCRS Esophageal, Gastrointestinal & Colorectal Surgery Robotic and Minimally Invasive Surgery  [facility], [place]  [address] [place], [place] [address] [phone] Fax [phone] Main  CONTACT INFORMATION:  Weekday (9AM-5PM): Call [facility] main office at [phone]  Weeknight (5PM-9AM) or Weekend/Holiday: Check [email or web address] (password " [email or web address]") for General Surgery [facility] coverage  (Please, do not use SecureChat as it is not reliable communication to operating surgeons for immediate patient care)    03/28/2021

## 2021-03-28 NOTE — Discharge Instructions (Signed)
SURGERY: POST OP INSTRUCTIONS (Surgery for small bowel obstruction, colon resection, etc)   ######################################################################  EAT Gradually transition to a high fiber diet with a fiber supplement over the next few days after discharge  WALK Walk an hour a day.  Control your pain to do that.    CONTROL PAIN Control pain so that you can walk, sleep, tolerate sneezing/coughing, go up/down stairs.  HAVE A BOWEL MOVEMENT DAILY Keep your bowels regular to avoid problems.  OK to try a laxative to override constipation.  OK to use an antidairrheal to slow down diarrhea.  Call if not better after 2 tries  CALL IF YOU HAVE PROBLEMS/CONCERNS Call if you are still struggling despite following these instructions. Call if you have concerns not answered by these instructions  ######################################################################   DIET Follow a light diet the first few days at home.  Start with a bland diet such as soups, liquids, starchy foods, low fat foods, etc.  If you feel full, bloated, or constipated, stay on a ful liquid or pureed/blenderized diet for a few days until you feel better and no longer constipated. Be sure to drink plenty of fluids every day to avoid getting dehydrated (feeling dizzy, not urinating, etc.). Gradually add a fiber supplement to your diet over the next week.  Gradually get back to a regular solid diet.  Avoid fast food or heavy meals the first week as you are more likely to get nauseated. It is expected for your digestive tract to need a few months to get back to normal.  It is common for your bowel movements and stools to be irregular.  You will have occasional bloating and cramping that should eventually fade away.  Until you are eating solid food normally, off all pain medications, and back to regular activities; your bowels will not be normal. Focus on eating a low-fat, high fiber diet the rest of your life  (See Getting to [place], below).  CARE of your INCISION or WOUND It is good for closed incision and even open wounds to be washed every day.  Shower every day.  Short baths are fine.  Wash the incisions and wounds clean with soap & water.     If you have a closed incision(s), wash the incision with soap & water every day.  You may leave closed incisions open to air if it is dry.   You may cover the incision with clean gauze & replace it after your daily shower for comfort.  It is good for closed incisions and even open wounds to be washed every day.  Shower every day.  Short baths are fine.  Wash the incisions and wounds clean with soap & water.    You may leave closed incisions open to air if it is dry.   You may cover the incision with clean gauze & replace it after your daily shower for comfort.  TEGADERM:  You have clear gauze band-aid dressings over your closed incision(s).  Remove the dressings 3 days after surgery.   If you have an open wound with a wound vac, see wound vac care instructions.     ACTIVITIES as tolerated Start light daily activities --- self-care, walking, climbing stairs-- beginning the day after surgery.  Gradually increase activities as tolerated.  Control your pain to be active.  Stop when you are tired.  Ideally, walk several times a day, eventually an hour a day.   Most people are back to most day-to-day activities in  a few weeks.  It takes 4-8 weeks to get back to unrestricted, intense activity. If you can walk 30 minutes without difficulty, it is safe to try more intense activity such as jogging, treadmill, bicycling, low-impact aerobics, swimming, etc. Save the most intensive and strenuous activity for last (Usually 4-8 weeks after surgery) such as sit-ups, heavy lifting, contact sports, etc.  Refrain from any intense heavy lifting or straining until you are off narcotics for pain control.  You will have off days, but things should improve  week-by-week. DO NOT PUSH THROUGH PAIN.  Let pain be your guide: If it hurts to do something, don't do it.  Pain is your body warning you to avoid that activity for another week until the pain goes down. You may drive when you are no longer taking narcotic prescription pain medication, you can comfortably wear a seatbelt, and you can safely make sudden turns/stops to protect yourself without hesitating due to pain. You may have sexual intercourse when it is comfortable. If it hurts to do something, stop.  MEDICATIONS Take your usually prescribed home medications unless otherwise directed.   Blood thinners:  Usually you can restart any strong blood thinners after the second postoperative day.  It is OK to take aspirin right away.     If you are on strong blood thinners (warfarin/Coumadin, Plavix, Xerelto, Eliquis, Pradaxa, etc), discuss with your surgeon, medicine PCP, and/or cardiologist for instructions on when to restart the blood thinner & if blood monitoring is needed (PT/INR blood check, etc).     PAIN CONTROL Pain after surgery or related to activity is often due to strain/injury to muscle, tendon, nerves and/or incisions.  This pain is usually short-term and will improve in a few months.  To help speed the process of healing and to get back to regular activity more quickly, DO THE FOLLOWING THINGS TOGETHER: Increase activity gradually.  DO NOT PUSH THROUGH PAIN Use Ice and/or Heat Try Gentle Massage and/or Stretching Take over the counter pain medication Take Narcotic prescription pain medication for more severe pain  Good pain control = faster recovery.  It is better to take more medicine to be more active than to stay in bed all day to avoid medications.  Increase activity gradually Avoid heavy lifting at first, then increase to lifting as tolerated over the next 6 weeks. Do not push through the pain.  Listen to your body and avoid positions and maneuvers than reproduce the pain.   Wait a few days before trying something more intense Walking an hour a day is encouraged to help your body recover faster and more safely.  Start slowly and stop when getting sore.  If you can walk 30 minutes without stopping or pain, you can try more intense activity (running, jogging, aerobics, cycling, swimming, treadmill, sex, sports, weightlifting, etc.) Remember: If it hurts to do it, then dont do it! Use Ice and/or Heat You will have swelling and bruising around the incisions.  This will take several weeks to resolve. Ice packs or heating pads (6-8 times a day, 30-60 minutes at a time) will help sooth soreness & bruising. Some people prefer to use ice alone, heat alone, or alternate between ice & heat.  Experiment and see what works best for you.  Consider trying ice for the first few days to help decrease swelling and bruising; then, switch to heat to help relax sore spots and speed recovery. Shower every day.  Short baths are fine.  It feels good!  Keep the incisions and wounds clean with soap & water.   Try Gentle Massage and/or Stretching Massage at the area of pain many times a day Stop if you feel pain - do not overdo it Take over the counter pain medication This helps the muscle and nerve tissues become less irritable and calm down faster Choose ONE of the following over-the-counter anti-inflammatory medications: Acetaminophen 500mg  tabs (Tylenol) 1-2 pills with every meal and just before bedtime (avoid if you have liver problems or if you have acetaminophen in you narcotic prescription) Naproxen 220mg  tabs (ex. Aleve, Naprosyn) 1-2 pills twice a day (avoid if you have kidney, stomach, IBD, or bleeding problems) Ibuprofen 200mg  tabs (ex. Advil, Motrin) 3-4 pills with every meal and just before bedtime (avoid if you have kidney, stomach, IBD, or bleeding problems) Take with food/snack several times a day as directed for at least 2 weeks to help keep pain / soreness down & more  manageable. Take Narcotic prescription pain medication for more severe pain A prescription for strong pain control is often given to you upon discharge (for example: oxycodone/Percocet, hydrocodone/Norco/Vicodin, or tramadol/Ultram) Take your pain medication as prescribed. Be mindful that most narcotic prescriptions contain Tylenol (acetaminophen) as well - avoid taking too much Tylenol. If you are having problems/concerns with the prescription medicine (does not control pain, nausea, vomiting, rash, itching, etc.), please call us [phone] to see if we need to switch you to a different pain medicine that will work better for you and/or control your side effects better. If you need a refill on your pain medication, you must call the office before 4 pm and on weekdays only.  By federal law, prescriptions for narcotics cannot be called into a pharmacy.  They must be filled out on paper & picked up from our office by the patient or authorized caretaker.  Prescriptions cannot be filled after 4 pm nor on weekends.    WHEN TO CALL us [phone] Severe uncontrolled or worsening pain  Fever over 101 F (38.5 C) Concerns with the incision: Worsening pain, redness, rash/hives, swelling, bleeding, or drainage Reactions / problems with new medications (itching, rash, hives, nausea, etc.) Nausea and/or vomiting Difficulty urinating Difficulty breathing Worsening fatigue, dizziness, lightheadedness, blurred vision Other concerns If you are not getting better after two weeks or are noticing you are getting worse, contact our office [phone] for further advice.  We may need to adjust your medications, re-evaluate you in the office, send you to the emergency room, or see what other things we can do to help. The clinic staff is available to answer your questions during regular business hours (8:30am-5pm).  Please dont hesitate to call and ask to speak to one of our nurses for clinical concerns.    A  surgeon from [facility] is always on call at the hospitals 24 hours/day If you have a medical emergency, go to the nearest emergency room or call 911.  FOLLOW UP in our office One the day of your discharge from the hospital (or the next business weekday), please call [facility] to set up or confirm an appointment to see your surgeon in the office for a follow-up appointment.  Usually it is 2-3 weeks after your surgery.   If you have skin staples at your incision(s), let the office know so we can set up a time in the office for the nurse to remove them (usually around 10 days after surgery). Make sure that you call for  appointments the day of discharge (or the next business weekday) from the hospital to ensure a convenient appointment time. IF YOU HAVE DISABILITY OR FAMILY LEAVE FORMS, BRING THEM TO THE OFFICE FOR PROCESSING.  DO NOT GIVE THEM TO YOUR DOCTOR.  [facility] [address] [place], [place], [place]  [address] ? [phone] - Main [phone] - [place],  [phone] - Fax [email or web address]  GETTING TO GOOD BOWEL HEALTH. It is expected for your digestive tract to need a few months to get back to normal.  It is common for your bowel movements and stools to be irregular.  You will have occasional bloating and cramping that should eventually fade away.  Until you are eating solid food normally, off all pain medications, and back to regular activities; your bowels will not be normal.   Avoiding constipation The goal: ONE SOFT BOWEL MOVEMENT A DAY!    Drink plenty of fluids.  Choose water first. TAKE A FIBER SUPPLEMENT EVERY DAY THE REST OF YOUR LIFE During your first week back home, gradually add back a fiber supplement every day Experiment which form you can tolerate.   There are many forms such as powders, tablets, wafers, gummies, etc Psyllium bran (Metamucil), methylcellulose (Citrucel), Miralax or Glycolax,  Benefiber, Flax Seed.  Adjust the dose week-by-week (1/2 dose/day to 6 doses a day) until you are moving your bowels 1-2 times a day.  Cut back the dose or try a different fiber product if it is giving you problems such as diarrhea or bloating. Sometimes a laxative is needed to help jump-start bowels if constipated until the fiber supplement can help regulate your bowels.  If you are tolerating eating & you are farting, it is okay to try a gentle laxative such as double dose MiraLax, prune juice, or Milk of Magnesia.  Avoid using laxatives too often. Stool softeners can sometimes help counteract the constipating effects of narcotic pain medicines.  It can also cause diarrhea, so avoid using for too long. If you are still constipated despite taking fiber daily, eating solids, and a few doses of laxatives, call our office. Controlling diarrhea Try drinking liquids and eating bland foods for a few days to avoid stressing your intestines further. Avoid dairy products (especially milk & ice cream) for a short time.  The intestines often can lose the ability to digest lactose when stressed. Avoid foods that cause gassiness or bloating.  Typical foods include beans and other legumes, cabbage, broccoli, and dairy foods.  Avoid greasy, spicy, fast foods.  Every person has some sensitivity to other foods, so listen to your body and avoid those foods that trigger problems for you. Probiotics (such as active yogurt, Align, etc) may help repopulate the intestines and colon with normal bacteria and calm down a sensitive digestive tract Adding a fiber supplement gradually can help thicken stools by absorbing excess fluid and retrain the intestines to act more normally.  Slowly increase the dose over a few weeks.  Too much fiber too soon can backfire and cause cramping & bloating. It is okay to try and slow down diarrhea with a few doses of antidiarrheal medicines.   Bismuth subsalicylate (ex. Kayopectate, Pepto Bismol)  for a few doses can help control diarrhea.  Avoid if pregnant.   Loperamide (Imodium) can slow down diarrhea.  Start with one tablet (2mg ) first.  Avoid if you are having fevers or severe pain.  ILEOSTOMY PATIENTS WILL HAVE CHRONIC DIARRHEA since their colon is  not in use.    Drink plenty of liquids.  You will need to drink even more glasses of water/liquid a day to avoid getting dehydrated. Record output from your ileostomy.  Expect to empty the bag every 3-4 hours at first.  Most people with a permanent ileostomy empty their bag 4-6 times at the least.   Use antidiarrheal medicine (especially Imodium) several times a day to avoid getting dehydrated.  Start with a dose at bedtime & breakfast.  Adjust up or down as needed.  Increase antidiarrheal medications as directed to avoid emptying the bag more than 8 times a day (every 3 hours). Work with your wound ostomy nurse to learn care for your ostomy.  See ostomy care instructions. TROUBLESHOOTING IRREGULAR BOWELS 1) Start with a soft & bland diet. No spicy, greasy, or fried foods.  2) Avoid gluten/wheat or dairy products from diet to see if symptoms improve. 3) Miralax 17gm or flax seed mixed in [place]. water or juice-daily. May use 2-4 times a day as needed. 4) Gas-X, Phazyme, etc. as needed for gas & bloating.  5) Prilosec (omeprazole) over-the-counter as needed 6)  Consider probiotics (Align, Activa, etc) to help calm the bowels down  Call your doctor if you are getting worse or not getting better.  Sometimes further testing (cultures, endoscopy, X-ray studies, CT scans, bloodwork, etc.) may be needed to help diagnose and treat the cause of the diarrhea. [facility], [place], [place], [place], [place]  [phone] - Main.    [phone]  - Toll Free.   [phone] - Fax [email or web address]   ########################################################

## 2021-03-28 NOTE — Op Note (Addendum)
03/28/2021  1:21 PM  PATIENT:  Zachary Montoya  76 y.o. male  Patient Care Team: Crist Infante, MD as PCP - General (Internal Medicine) Constance Haw, MD as PCP - Electrophysiology (Cardiology) Warren Danes, PA-C as Physician Assistant (Dermatology) Michael Boston, MD as Consulting Physician (Colon and Rectal Surgery) Clarene Essex, MD as Consulting Physician (Gastroenterology) Peri Jefferson, FNP (Pulmonary Disease) Jolene Provost, PA-C as Physician Assistant (Otolaryngology) Jettie Booze, MD as Consulting Physician (Cardiology)  PRE-OPERATIVE DIAGNOSIS:    Recurrent left colon diverticulitis Incarcerated incisional hernia   POST-OPERATIVE DIAGNOSIS:   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 PRIMARY REPAIR INCARCERATED PERIUMBILICAL INCISIONAL HERNIA - 2cm RIGID PROCTOSCOPY  SURGEON:  Adin Hector, MD  ASSISTANT: Leighton Ruff, MD, FACS, FASCRS An experienced assistant was required given the standard of surgical care given the complexity of the case.  This assistant was needed for exposure, dissection, suction, tissue approximation, retraction, perception, etc.  ANESTHESIA:     General  Regional TRANSVERSUS ABDOMINIS PLANE (TAP) nerve block for perioperative & postoperative pain control provided with liposomal bupivacaine (Experel) mixed with 0.25% bupivacaine as a Bilateral TAP block x 74mL each side at the level of the transverse abdominis & preperitoneal spaces along the flank at the anterior axillary line, from subcostal ridge to iliac crest under laparoscopic guidance   Local field block at port sites & extraction wound  EBL:  Total I/O In: 2300 [I.V.:2200; IV Piggyback:100] Out: 600 [Urine:500; Blood:100]  Delay start of Pharmacological VTE agent  (>24hrs) due to surgical blood loss or risk of bleeding:  no  DRAINS: 19 Fr Blake drain goes to the pelvis  SPECIMEN:   RECTOSIGMOID COLON (open end proximal) DISTAL ANASTOMOTIC RING (final distal margin)  DISPOSITION OF SPECIMEN:  PATHOLOGY  COUNTS:  YES  PLAN OF CARE: Admit to inpatient   PATIENT DISPOSITION:  PACU - hemodynamically stable.  INDICATION:    Pleasant patient with history of dysrhythmias and chronic anticoagulation with episodes of recurrent diverticulitis in the distal descending and proximal sigmoid colon.  I recommended segmental resection:  The anatomy & physiology of the digestive tract was discussed.  The pathophysiology was discussed.  Natural history risks without surgery was discussed.   I worked to give an overview of the disease and the frequent need to have multispecialty involvement.  I feel the risks of no intervention will lead to serious problems that outweigh the operative risks; therefore, I recommended a partial colectomy to remove the pathology.  Laparoscopic & open techniques were discussed.   Risks such as bleeding, infection, abscess, leak, reoperation, possible ostomy, hernia, heart attack, death, and other risks were discussed.  I noted a good likelihood this will help address the problem.   Goals of post-operative recovery were discussed as well.  We will work to minimize complications.  Educational materials on the pathology had been given in the office.  Questions were answered.    The patient expressed understanding & wished to proceed with surgery.  OR FINDINGS:   Patient had chronically thickened distal descending colon as well as proximal sigmoid colon somewhat twisted and folded upon itself.  Redundant sigmoid colon.  The anastomosis rests 15 cm from the anal verge by rigid proctoscopy.  Is a 41 EEA mid-descending to proximal rectum anastomosis.  34mm cystotomy at very high dome of the bladder which going almost up  to the umbilicus.   Primary repair done.  No leak on insufflation.  Drain left in place.  2cm periumbilical incisional hernia incarcerated with omentum and falciform ligament.  Reduction and transverse primary repair done with #1 PDS.  CASE DATA:  Type of patient?: Elective [facility] Private Case  Status of Case? Elective Scheduled  Infection Present At Time Of Surgery (PATOS)?  PHLEGMON  DESCRIPTION:   Informed consent was confirmed.  The patient underwent general anaesthesia without difficulty.  The patient was positioned appropriately.  VTE prevention in place.  The patient was clipped, prepped, & draped in a sterile fashion.  Surgical timeout confirmed our plan.  The patient was positioned in reverse Trendelenburg.  Abdominal entry was gained using Varess technique at the left subcostal ridge on the anterior abdominal wall.  No elevated EtCO2 noted.  Port placed.  Camera inspection revealed no injury.  Moderate omental adhesions noted supraumbilically -these were carefully swept off with the camera such that extra ports were carefully placed under direct laparoscopic visualization.  I freed freeing off the greater omentum up to the liver edge.  There are some old adhesion of antrum of the stomach and left lateral hepatic sector to the anterior abdominal wall at the costal ridge that I left in place  Upon entering the abdomen (organ space), I encountered a phlegmon involving the distal descending and proximal sigmoid colon .   I reflected the the small bowel in the upper abdomen.  The patient was carefully positioned.  The [employer] daVinci robot was docked with camera & instruments carefully placed.  The patient had rather redundant dilated sigmoid colon twisted upon itself.  Not quite of a volvulus but definitely a hairpin point where he could do that easily.  Inflammation of the distal descending colon with phlegmon.  Mid sigmoid kinked upon itself as well I mobilized the rectosigmoid colon & elevated it to put the  main pedicle on tension.  I scored the base of peritoneum of the medial side of the mesentery of the elevated left colon from the ligament of Treitz to the mid rectum.   I elevated the sigmoid mesentery and entered into the retro-mesenteric plane. We were able to identify the left ureter and gonadal vessels. We kept those posterior within the retroperitoneum and elevated the left colon mesentery off that. I did isolate the inferior mesenteric artery (IMA) pedicle but did not ligate it yet.  I continued distally and got into the avascular plane posterior to the mesorectum, sparing the nervi ergentes.. This allowed me to help mobilize the rectum as well by freeing the mesorectum off the sacrum.  I stayed away from the right and left ureters.  I kept the lateral vascular pedicles to the rectum intact.  I skeletonized the lymph nodes off the inferior mesenteric artery pedicle.  I went down to its takeoff from the aorta.   I isolated the inferior mesenteric vein off of the ligament of Treitz just cephalad to that as well.  After confirming the left ureter was out of the way, I went ahead and ligated the inferior mesenteric artery pedicle just near its takeoff from the aorta.  I did ligate the inferior mesenteric vein in a similar fashion.  We ensured hemostasis.  I continued medial to lateral dissection to free the left colon mesentery off the retroperitoneum going up towards the splenic flexure to allow good mobility and protect the colon mesentery.  I mobilized the left colon in a lateral to medial fashion off  the retroperitoneum and sidewall attachments along the line of Toldt up towards the splenic flexure to ensure good mobilization of the remaining left colon to reach into the pelvis.   We then focused on mesorectal dissection.  Freed the mesorectum off the presacral plane until I was distal to the concerning region.  Freed off peritoneum on the lateral sidewalls as well and transected the mesentery of the  lateral pedicles to get distal to the area of concern.  Came around anteriorly such that I had good circumferential mesorectal excision and a good margin distal to the area of concern.  I chose a region at the mid descending colon that was soft.  Ended up having mobilized some of the splenic flexure to get a little better mobility down such that the mid descending colon could reach down to the sacral promontory.  I transected the mesentery of the colon radially to preserve remaining colon blood supply.  I skeletonized the mesorectum at the rectosigmoid junction   To access vascular perfusion of tissues, we asked anesthesia use intravenous  indocyanine green (ICG) with IV flush.  I switched to the NIR fluorescence (Firefly mode) imaging window on the daVinci robot platform.  We were able to see good light green visualization of blood vessels with good vascular perfusion of tissues, confirming good tissue perfusion of tissues planned for anastomosis.  We placed a 12 mm stapler blunt port in the right suprapubic region under direct visualization.  Was somewhat thickened and challenging and weaned the port more cephalad.  Eventually came through.  Seem to gently thin out.  We transected at the proximal rectum using a robotic stapler -60 mm green load.  No obvious hematuria noted  We created an extraction incision through a small Pfannenstiel incision in the suprapubic region.  Placed a wound protector.  I was able to eviscerate the rectosigmoid and descending colon out the wound.   I clamped the colon proximal to this area using a reusable pursestringer device.  Passed a 2-0 Keith needle. I transected at the descending/sigmoid junction with a scalpel. I got healthy bleeding mucosa.  We sent the rectosigmoid colon specimen off to go to pathology.  We sized the colon orifice.  I chose a 48mm EEA anvil stapler system.  I reinforced the prolene pursestring with interrupted silk "belt loop" sutures.  I placed the anvil  to the open end of the proximal remaining colon and closed around it using the pursestring.    We did copious irrigation with crystalloid solution.  Hemostasis was good.  The distal end of the remaining colon easily reached down to the rectal stump, therefore, splenic flexure mobilization was not needed.   Dr Marcello Moores scrubbed down and did gentle anal dilation and advanced the EEA stapler up the rectal stump. The spike was brought out at the provimal end of the rectal stump under direct visualization.  I  attached the anvil of the proximal colon the spike of the stapler. Anvil was tightened down and held clamped for 60 seconds.  Orientation was confirmed such that there is no twisting of the colon nor small bowel underneath the mesenteric defect. No concerning tension.  The EEA stapler was fired and held clamped for 30 seconds. The stapler was released & removed. Blue stitch is in the proximal ring.  Care was taken to ensure no other structures were incorporated within this either.  We noted 2 excellent anastomotic rings.   The colon proximal to the anastomosis was then gently occluded.  The pelvis was filled with sterile irrigation.  Dr Marcello Moores  did rigid proctoscopy noted the anastomosis was at 15 cm from the anal verge consistent with the proximal rectum.  There was a negative air leak test. There was no tension of mesentery or bowel at the anastomosis.   Tissues looked viable.  Ureters & bowel uninjured.  The anastomosis looked healthy.   On discussing with anesthesia they did notice that the urine was blood-tinged.  To be safe we had the circulator insufflate the bladder with methylene blue sterile solution.  Got good dilation of the bladder which tend to be quite floppy ptotic and much higher than initially expected.  Did notice a leak of methylene blue.  Could confirm it seemed to be on the posterior dome of the bladder.    Endoluminal gas was evacuated.  Ports & wound protector removed.  We changed  gloves & redraped the patient per colon SSI prevention protocol.  We aspirated the irrigation.  Hemostasis was good.  Sterile unused instruments were used from this point.  We could elevate and see the cystotomy in the stretched out dome of the posterior bladder wall.  In about 7 mm.  I did dilated from my finger to going to prove this was at the apex of the dome and were not near the ureteral orifices.  Could palpate and confirm that there was no other opening concerning for through and through injury.  We primarily closed this transversely using 2-0 Vicryl interrupted sutures in a single layer with good full-thickness bites.  We had circulator Reese deflate the bladder.  Got excellent insufflation with no leaking at the repair.  I clamped off the skin and repeated diagnostic laparoscopy with the bladder insufflated improved no leaking elsewhere.  We placed a drain down the pelvis to rest along the left paracolic gutter and dome of the bladder.  I closed the extraction wound using a 0 Vicryl vertical peritoneal closure taking healthy bites of rectus muscle as well.  Use this as a good rectus muscle patch on the bladder repair that was on the posterior dome of the bladder.  We closed the anterior rectus fascia transversely to complete the fascial Pfannenstiel closure.  Next I focused on the periumbilical hernia.  I made a transverse omega shaped incision through the skin at the supraumbilical fold.  I freed the umbilical stalk off to expose the 2 cm hernia that had some old omentum and falciform ligament and fat.  Freed that off.  Primarily repaired the hernia transversely using interrupted #1 PDS to good result.  I tacked the umbilicus back down to the fascia using 0 Vicryl.  I closed the skin at the port sites using Monocryl stitch and sterile dressing.   I closed the skin with some interrupted Monocryl stitches.  I placed sterile dressings.     Patient is being extubated go to recovery room.  No major  hemodynamic events and most likely placed on floor with a low threshold to put in stepdown if needed.  Plan will be to leave Foley catheter for a week and then cystogram to prove no evidence of delayed leak.  Leave the 32 [place] Blake drain in the pelvis for the first few days.  Check drain creatinine and if within normal limits remove before discharge.  Discussed with my urology colleague, Dr. Clista Bernhardt, who agrees with the plan.  I had discussed postop care with the patient in detail the office & in the holding area.  Instructions are written. I discussed operative findings, updated the patient's status, discussed probable steps to recovery, and gave postoperative recommendations to the patient's spouse, Anderson Malta.  Recommendations were made.  Questions were answered.  She expressed understanding & appreciation.  Adin Hector, M.D., F.A.C.S. Gastrointestinal and Minimally Invasive Surgery [facility] [address] [place], [place] [address] [phone] Main / Paging

## 2021-03-28 NOTE — Addendum Note (Signed)
Addendum  created 03/28/21 1552 by Darral Dash, DO   Clinical Note Signed

## 2021-03-28 NOTE — Anesthesia Postprocedure Evaluation (Addendum)
Anesthesia Post Note  Patient: Zachary Montoya [place]  Procedure(s) Performed: ROBOTIC RESECTION OF COLON, DESCENDING AND SIGMOID, INTRAOPERATIVE ASSESSMENT OF PERFUSION USING FIREFLY, BILATERAL TAP BLOCK, BLADDER REPAIR. (Abdomen) PRIMARY REPAIR INCERCERATED UMBILICAL HERNIA 2CM (Abdomen) RIGID PROCTOSCOPY      No notable events documented.  Last Vitals:  Vitals:   03/28/21 1500 03/28/21 1533  BP: 135/86 (!) 145/81  Pulse: 68 67  Resp: 15 18  Temp: 36.7 C   SpO2: 100% 100%    Last Pain:  Vitals:   03/28/21 1500  TempSrc:   PainSc: Asleep                 Belenda Cruise P Egon Dittus

## 2021-03-29 ENCOUNTER — Encounter (HOSPITAL_COMMUNITY): Payer: Self-pay | Admitting: Surgery

## 2021-03-29 DIAGNOSIS — Z860100 Personal history of colon polyps, unspecified: Secondary | ICD-10-CM | POA: Insufficient documentation

## 2021-03-29 DIAGNOSIS — E039 Hypothyroidism, unspecified: Secondary | ICD-10-CM | POA: Insufficient documentation

## 2021-03-29 DIAGNOSIS — D509 Iron deficiency anemia, unspecified: Secondary | ICD-10-CM | POA: Insufficient documentation

## 2021-03-29 DIAGNOSIS — K219 Gastro-esophageal reflux disease without esophagitis: Secondary | ICD-10-CM | POA: Insufficient documentation

## 2021-03-29 DIAGNOSIS — K5901 Slow transit constipation: Secondary | ICD-10-CM | POA: Insufficient documentation

## 2021-03-29 LAB — CBC
HCT: 38.1 % — ABNORMAL LOW (ref 39.0–52.0)
Hemoglobin: 12.5 g/dL — ABNORMAL LOW (ref 13.0–17.0)
MCH: 31.7 pg (ref 26.0–34.0)
MCHC: 32.8 g/dL (ref 30.0–36.0)
MCV: 96.7 fL (ref 80.0–100.0)
Platelets: 179 10*3/uL (ref 150–400)
RBC: 3.94 MIL/uL — ABNORMAL LOW (ref 4.22–5.81)
RDW: 12.5 % (ref 11.5–15.5)
WBC: 13.5 10*3/uL — ABNORMAL HIGH (ref 4.0–10.5)
nRBC: 0 % (ref 0.0–0.2)

## 2021-03-29 LAB — MAGNESIUM: Magnesium: 1.7 mg/dL (ref 1.7–2.4)

## 2021-03-29 LAB — BASIC METABOLIC PANEL
Anion gap: 4 — ABNORMAL LOW (ref 5–15)
BUN: 13 mg/dL (ref 8–23)
CO2: 26 mmol/L (ref 22–32)
Calcium: 8.3 mg/dL — ABNORMAL LOW (ref 8.9–10.3)
Chloride: 104 mmol/L (ref 98–111)
Creatinine, Ser: 1.02 mg/dL (ref 0.61–1.24)
GFR, Estimated: >60 mL/min (ref >60)
Glucose, Bld: 139 mg/dL — ABNORMAL HIGH (ref 70–99)
Potassium: 4.3 mmol/L (ref 3.5–5.1)
Sodium: 134 mmol/L — ABNORMAL LOW (ref 135–145)

## 2021-03-29 MED ORDER — NYSTATIN 100000 UNIT/GM EX POWD
Freq: Three times a day (TID) | CUTANEOUS | Status: DC
  Filled 2021-03-29: qty 15

## 2021-03-29 MED ORDER — CHLORHEXIDINE GLUCONATE CLOTH 2 % EX PADS
6.0000 | MEDICATED_PAD | Freq: Every day | CUTANEOUS | Status: DC
  Administered 2021-03-29 – 2021-03-30 (×2): 6 via TOPICAL

## 2021-03-29 NOTE — Progress Notes (Signed)
Pt in PACU Normal sinus rhythm.  Somewhat sleepy and somnolent but does awake to stimuli.  Will transfer to floor if wakes up and looks good.  Low threshold to place in stepdown unit.  Discussed with PACU nurse at bedside as well.

## 2021-03-29 NOTE — Progress Notes (Signed)
Zachary Montoya [id number] Feb 07, 1946  CARE TEAM:  PCP: Crist Infante, MD  Outpatient Care Team: Patient Care Team: Crist Infante, MD as PCP - General (Internal Medicine) Constance Haw, MD as PCP - Electrophysiology (Cardiology) Warren Danes, PA-C as Physician Assistant (Dermatology) Michael Boston, MD as Consulting Physician (Colon and Rectal Surgery) Clarene Essex, MD as Consulting Physician (Gastroenterology) Peri Jefferson, FNP (Pulmonary Disease) Jolene Provost, PA-C as Physician Assistant (Otolaryngology) Jettie Booze, MD as Consulting Physician (Cardiology)  Inpatient Treatment Team: Treatment Team: Attending Provider: Michael Boston, MD; Technician: Rutherford Nail, NT; Registered Nurse: Verdie Drown, RN   Problem List:   Principal Problem:   Diverticulitis of large intestine with abscess   1 Day Post-Op  03/28/2021  POST-OPERATIVE DIAGNOSIS:   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 PRIMARY REPAIR INCARCERATED PERIUMBILICAL INCISIONAL HERNIA - 2cm RIGID PROCTOSCOPY   SURGEON:  Adin Hector, MD  OR FINDINGS:    Patient had chronically thickened distal descending colon as well as proximal sigmoid colon somewhat twisted and folded upon itself.  Redundant sigmoid colon.   The anastomosis rests 15 cm from the anal verge by rigid proctoscopy.  Is a 87 EEA mid-descending to proximal rectum anastomosis.   70mm cystotomy at very high dome of the bladder which going almost up to the umbilicus.  Primary repair done.  No leak on insufflation.  Drain left in place.   2cm periumbilical incisional hernia incarcerated with omentum and falciform ligament.  Reduction and transverse primary repair done with #1 PDS.  Assessment  Doing relatively  well  [facility] Stay = 1 days)  Plan:  -Enhance recovery/ERAS protocol  Follow-up on pathology  Stop IV fluids and keep on the dry side with his cardiac issues  Flecainide for his cardiac dysrhythmias.  Metoprolol as well.  As needed breakthrough  Patient has some scrotal itching but did not see any major rash nor edema.  We will add nystatin powder 3 times daily.  Resume his usual H2 blocker.  Loratadine is on formulary as opposed to his usual Zyrtec.  Benadryl for breakthrough as well.  He needs to keep the Foley catheter for 7 days with plan cystogram.  Hopefully small bladder repair spot will heal fine.  Keep the surgical drain for the first 48 hours.  If low output and improving, check drain creatinine.  If negative removed.  I discussed this plan with urology, Dr. Tresa Moore.  That is rather standard.  He agrees.  Patient very appreciative of care.  Discussed the plan with the patient as well as night and oncoming morning nurse.  Resume home CPAP for sleep apnea.  -VTE prophylaxis- SCDs, etc  -mobilize as tolerated to help recovery  Disposition:  Disposition:  The patient is from: Home  Anticipate discharge to:  Home with Home Health  Anticipated Date of Discharge is:  February 3,2023    Barriers to discharge:  Pending Clinical improvement (more likely than not)  Patient currently is NOT MEDICALLY STABLE for discharge from the hospital from a surgery standpoint.      25 minutes spent in review, evaluation, examination, counseling, and coordination of care.   I have reviewed this patient's available data, including medical history, events of note, physical examination and test results as part of my evaluation.  A significant portion of that time was spent in counseling.  Care during the described time interval was provided by me.  03/29/2021    Subjective: (Chief complaint)  Patient sitting up in a chair bright and alert.  Tolerated liquids without any nausea  or vomiting.  Denies much pain.  Feels like he is doing rather well.  Does have some itching on his scrotum and perineum that is annoying.  Was just taking Tylenol.  Did not ask for stronger pain medications.  Nursing team just outside room.  Objective:  Vital signs:  Vitals:   03/28/21 1731 03/28/21 2154 03/29/21 0111 03/29/21 0533  BP: (!) 141/95 130/75 111/70 119/69  Pulse: 72 72 71 66  Resp: 14 14 14 18   Temp: 98 F (36.7 C) 97.9 F (36.6 C) 97.9 F (36.6 C) 98.2 F (36.8 C)  TempSrc: Oral Oral Oral Oral  SpO2: 100% 94% 95% 96%  Weight:    87.5 kg  Height:           Intake/Output   Yesterday:  02/01 0701 - 02/02 0700 In: 2900.2 [P.O.:600; I.V.:2200.2; IV Piggyback:100] Out: 6834 [Urine:2950; Drains:285; Blood:100] This shift:  Total I/O In: 600 [P.O.:600] Out: 1120 [Urine:1050; Drains:70]  Bowel function:  Flatus: YES  BM:  YES  Drain: Serosanguinous   Physical Exam:  General: Pt awake/alert in no acute distress.  Sitting up in chair.  Bright and alert and smiling. Eyes: PERRL, normal EOM.  Sclera clear.  No icterus Neuro: CN II-XII intact w/o focal sensory/motor deficits. Lymph: No head/neck/groin lymphadenopathy Psych:  No delerium/psychosis/paranoia.  Oriented x 4 HENT: Normocephalic, Mucus membranes moist.  No thrush Neck: Supple, No tracheal deviation.  No obvious thyromegaly Chest: No pain to chest wall compression.  Good respiratory excursion.  No audible wheezing CV:  Pulses intact.  Regular rhythm.  No major extremity edema MS: Normal AROM mjr joints.  No obvious deformity  Abdomen: Soft.  Mildy distended.  Nontender.  No evidence of peritonitis.  No incarcerated hernias.  Ext:   No deformity.  No mjr edema.  No cyanosis Skin: No petechiae / purpurea.  No major sores.  Warm and dry    Results:   Cultures: Recent Results (from the past 720 hour(s))  SARS CORONAVIRUS 2 (TAT 6-24 HRS) Nasopharyngeal Nasopharyngeal Swab     Status:  None   Collection Time: 03/26/21  6:57 AM   Specimen: Nasopharyngeal Swab  Result Value Ref Range Status   SARS Coronavirus 2 NEGATIVE NEGATIVE Final    Comment: (NOTE) SARS-CoV-2 target nucleic acids are NOT DETECTED.  The SARS-CoV-2 RNA is generally detectable in upper and lower respiratory specimens during the acute phase of infection. Negative results do not preclude SARS-CoV-2 infection, do not rule out co-infections with other pathogens, and should not be used as the sole basis for treatment or other patient management decisions. Negative results must be combined with clinical observations, patient history, and epidemiological information. The expected result is Negative.  Fact Sheet for Patients: SugarRoll.be  Fact Sheet for Healthcare Providers: https://www.woods-mathews.com/  This test is not yet approved or cleared by the [place] FDA and  has been authorized for detection and/or diagnosis of SARS-CoV-2 by FDA under an Emergency Use Authorization (EUA). This EUA will remain  in effect (meaning this test can be used) for the duration of the COVID-19 declaration under Se ction 564(b)(1) of the Act, 21 U.S.C. section 360bbb-3(b)(1), unless the authorization is terminated or revoked sooner.  Performed at [facility], [place] [address] [place], [place] [address]  Labs: Results for orders placed or performed during the hospital encounter of 03/28/21 (from the past 48 hour(s))  Type and screen [place]     Status: None   Collection Time: 03/28/21  8:40 AM  Result Value Ref Range   ABO/RH(D) A POS    Antibody Screen NEG    Sample Expiration      03/31/2021,2359 Performed at [facility], [place] [address], [place], [place] [address]   Basic metabolic panel     Status: Abnormal   Collection Time: 03/29/21  4:41 AM  Result Value Ref Range   Sodium 134 (L) 135 - 145 mmol/L    Potassium 4.3 3.5 - 5.1 mmol/L   Chloride 104 98 - 111 mmol/L   CO2 26 22 - 32 mmol/L   Glucose, Bld 139 (H) 70 - 99 mg/dL    Comment: Glucose reference range applies only to samples taken after fasting for at least 8 hours.   BUN 13 8 - 23 mg/dL   Creatinine, Ser 1.02 0.61 - 1.24 mg/dL   Calcium 8.3 (L) 8.9 - 10.3 mg/dL   GFR, Estimated >60 >60 mL/min    Comment: (NOTE) Calculated using the CKD-EPI Creatinine Equation (2021)    Anion gap 4 (L) 5 - 15    Comment: Performed at [facility], [place] [address], [place], [place] [address]  CBC     Status: Abnormal   Collection Time: 03/29/21  4:41 AM  Result Value Ref Range   WBC 13.5 (H) 4.0 - 10.5 K/uL   RBC 3.94 (L) 4.22 - 5.81 MIL/uL   Hemoglobin 12.5 (L) 13.0 - 17.0 g/dL   HCT 38.1 (L) 39.0 - 52.0 %   MCV 96.7 80.0 - 100.0 fL   MCH 31.7 26.0 - 34.0 pg   MCHC 32.8 30.0 - 36.0 g/dL   RDW 12.5 11.5 - 15.5 %   Platelets 179 150 - 400 K/uL   nRBC 0.0 0.0 - 0.2 %    Comment: Performed at [facility], [place] [address] [place],  [address]  Magnesium     Status: None   Collection Time: 03/29/21  4:41 AM  Result Value Ref Range   Magnesium 1.7 1.7 - 2.4 mg/dL    Comment: Performed at [facility], [place] [address] [place],  [address]    Imaging / Studies: No results found.  Medications / Allergies: per chart  Antibiotics: Anti-infectives (From admission, onward)    Start     Dose/Rate Route Frequency Ordered Stop   03/28/21 2200  cefoTEtan (CEFOTAN) 2 g in sodium chloride 0.9 % 100 mL IVPB        2 g 200 mL/hr over 30 Minutes Intravenous Every 12 hours 03/28/21 1540 03/28/21 2220   03/28/21 1400  neomycin (MYCIFRADIN) tablet 1,000 mg  Status:  Discontinued       See Hyperspace for full Linked Orders Report.   1,000 mg Oral 3 times per day 03/28/21 0826 03/28/21 0842   03/28/21 1400  metroNIDAZOLE (FLAGYL) tablet 1,000 mg  Status:  Discontinued       See  Hyperspace for full Linked Orders Report.   1,000 mg Oral 3 times per day 03/28/21 0826 03/28/21 0842   03/28/21 0830  cefoTEtan (CEFOTAN) 2 g in sodium chloride 0.9 % 100 mL IVPB        2 g 200 mL/hr over 30 Minutes Intravenous On call to O.R. 03/28/21 2836 03/28/21  1019         Note: Portions of this report may have been transcribed using voice recognition software. Every effort was made to ensure accuracy; however, inadvertent computerized transcription errors may be present.   Any transcriptional errors that result from this process are unintentional.    Adin Hector, MD, FACS, MASCRS Esophageal, Gastrointestinal & Colorectal Surgery Robotic and Minimally Invasive Surgery  [facility], [place]  [place]. [address] [place], [place] [address] [phone] Fax [phone] Main  CONTACT INFORMATION:  Weekday (9AM-5PM): Call [facility] main office at [phone]  Weeknight (5PM-9AM) or Weekend/Holiday: Check [email or web address] (password " [email or web address]") for General Surgery [facility] coverage  (Please, do not use SecureChat as it is not reliable communication to operating surgeons for immediate patient care)      03/29/2021  6:59 AM

## 2021-03-29 NOTE — Progress Notes (Signed)
°  Transition of Care [facility]) Screening Note   Patient Details  Name: Zachary Montoya Date of Birth: 02-Mar-1945   Transition of Care [facility]) CM/SW Contact:    Lennart Pall, LCSW Phone Number: 03/29/2021, 2:15 PM    Transition of Care Department [facility]) has reviewed patient and no TOC needs have been identified at this time. We will continue to monitor patient advancement through interdisciplinary progression rounds. If new patient transition needs arise, please place a TOC consult.

## 2021-03-29 NOTE — Progress Notes (Signed)
GI social visit having increased pain today compared to yesterday no problems from recent colonoscopy in okay spirits looks good thanks to surgical team we reviewed 9 benign polyps and await surgical pathology just to be sure and will be on standby to help as needed and happy to see back as needed and we discussed his MiraLAX at home

## 2021-03-30 LAB — CREATININE, FLUID (PLEURAL, PERITONEAL, JP DRAINAGE): Creat, Fluid: 1 mg/dL

## 2021-03-30 LAB — SURGICAL PATHOLOGY

## 2021-03-30 MED ORDER — OXYCODONE HCL 5 MG PO TABS
5.0000 mg | ORAL_TABLET | ORAL | Status: DC | PRN
  Administered 2021-03-30 (×2): 5 mg via ORAL
  Filled 2021-03-30: qty 2
  Filled 2021-03-30 (×2): qty 1

## 2021-03-30 MED ORDER — METHOCARBAMOL 1000 MG/10ML IJ SOLN
1000.0000 mg | Freq: Four times a day (QID) | INTRAVENOUS | Status: DC | PRN
  Filled 2021-03-30: qty 10

## 2021-03-30 MED ORDER — POLYETHYLENE GLYCOL 3350 17 G PO PACK
17.0000 g | PACK | Freq: Every day | ORAL | Status: DC
  Administered 2021-03-30: 17 g via ORAL
  Filled 2021-03-30: qty 1

## 2021-03-30 MED ORDER — GABAPENTIN 100 MG PO CAPS
200.0000 mg | ORAL_CAPSULE | Freq: Three times a day (TID) | ORAL | Status: DC
  Administered 2021-03-30 (×3): 200 mg via ORAL
  Filled 2021-03-30 (×3): qty 2

## 2021-03-30 NOTE — Progress Notes (Signed)
Zachary Montoya [id number] 1945-04-05  CARE TEAM:  PCP: Crist Infante, MD  Outpatient Care Team: Patient Care Team: Crist Infante, MD as PCP - General (Internal Medicine) Constance Haw, MD as PCP - Electrophysiology (Cardiology) Warren Danes, PA-C as Physician Assistant (Dermatology) Michael Boston, MD as Consulting Physician (Colon and Rectal Surgery) Clarene Essex, MD as Consulting Physician (Gastroenterology) Peri Jefferson, [place] (Pulmonary Disease) Jolene Provost, PA-C as Physician Assistant (Otolaryngology) Jettie Booze, MD as Consulting Physician (Cardiology)  Inpatient Treatment Team: Treatment Team: Attending Provider: Michael Boston, MD; Registered Nurse: Verda Cumins, RN; Technician: Leda Quail, NT; Charge Nurse: Steward Ros, RN; Utilization Review: Micah Noel, RN   Problem List:   Principal Problem:   Diverticulitis of large intestine with abscess Active Problems:   Hyperlipidemia   Paroxysmal atrial fibrillation ([place])   Asymmetric SNHL (sensorineural hearing loss)   Essential hypertension   2 Days Post-Op  03/28/2021  POST-OPERATIVE DIAGNOSIS:   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 PRIMARY REPAIR INCARCERATED PERIUMBILICAL INCISIONAL HERNIA - 2cm RIGID PROCTOSCOPY   SURGEON:  Adin Hector, MD  OR FINDINGS:    Patient had chronically thickened distal descending colon as well as proximal sigmoid colon somewhat twisted and folded upon itself.  Redundant sigmoid colon.   The anastomosis rests 15 cm from the anal verge by rigid proctoscopy.  Is a 51 EEA mid-descending to proximal rectum anastomosis.   76mm cystotomy at very high dome of the bladder which going almost up to the umbilicus.  Primary repair done.  No leak on  insufflation.  Drain left in place.   2cm periumbilical incisional hernia incarcerated with omentum and falciform ligament.  Reduction and transverse primary repair done with #1 PDS.  Assessment  Doing relatively well  [facility] Stay = 2 days)  Plan:  Enhance recovery/ERAS protocol  Drain creatinine WNL = OK to d/c  Foley care / leg bag training.  He needs to keep the Foley catheter for 7 days with plan cystogram.  Hopefully small bladder repair spot will heal fine.  Follow-up on pathology  Stop IV fluids and keep on the dry side with his cardiac issues  Flecainide for his cardiac dysrhythmias.  Metoprolol as well.  As needed breakthrough  Patient has some scrotal itching but did not see any major rash nor edema.  We will add nystatin powder 3 times daily.  Resume his usual H2 blocker.  Loratadine is on formulary as opposed to his usual Zyrtec.  Benadryl for breakthrough as well.  Patient very appreciative of care.  Discussed the plan with the patient as well as night and oncoming morning nurse.  Resume home CPAP for sleep apnea.  -VTE prophylaxis- SCDs, etc  -mobilize as tolerated to help recovery  Disposition:  Disposition:  The patient is from: Home  Anticipate discharge to:  Home with Home Health  Anticipated Date of Discharge is:  February 4,2023    Barriers to discharge:  Pending Clinical improvement (more likely than not)  Patient currently is NOT MEDICALLY STABLE for discharge from the hospital from a surgery standpoint.      25 minutes spent in review, evaluation, examination, counseling, and coordination of care.   I have reviewed this patient's available data, including medical history, events of note, physical examination and test results as part of my evaluation.  A  significant portion of that time was spent in counseling.  Care during the described time interval was provided by me.  03/30/2021    Subjective: (Chief complaint)  Had a rather  "sore" last night and required Dilaudid.  Feeling better.  Tolerating p.o.  Minimal drain output.  Walking well.  Objective:  Vital signs:  Vitals:   03/29/21 1405 03/29/21 2118 03/30/21 0500 03/30/21 0600  BP: (!) 143/80 140/83  135/79  Pulse: 71 73  60  Resp: 17 12  14   Temp: 98.3 F (36.8 C) 98.4 F (36.9 C)  97.8 F (36.6 C)  TempSrc: Oral Oral  Oral  SpO2: 97% 95%  96%  Weight:   89.3 kg   Height:           Intake/Output   Yesterday:  02/02 0701 - 02/03 0700 In: 1040 [P.O.:1040] Out: 2365 [Urine:2300; Drains:65] This shift:  No intake/output data recorded.  Bowel function:  Flatus: YES  BM:  YES  Drain: Serosanguinous   Physical Exam:  General: Pt awake/alert in no acute distress.  Sitting up in bed.  Tired but not sickly.  Bright and alert and smiling. Eyes: PERRL, normal EOM.  Sclera clear.  No icterus Neuro: CN II-XII intact w/o focal sensory/motor deficits. Lymph: No head/neck/groin lymphadenopathy Psych:  No delerium/psychosis/paranoia.  Oriented x 4 HENT: Normocephalic, Mucus membranes moist.  No thrush Neck: Supple, No tracheal deviation.  No obvious thyromegaly Chest: No pain to chest wall compression.  Good respiratory excursion.  No audible wheezing CV:  Pulses intact.  Regular rhythm.  No major extremity edema MS: Normal AROM mjr joints.  No obvious deformity  Abdomen: Soft.  Mildy distended.  Mildly tender at incisions only.  No evidence of peritonitis.  No guarding.  No pain with percussion or bed shake.  No incarcerated hernias.  Ext:   No deformity.  No mjr edema.  No cyanosis Skin: No petechiae / purpurea.  No major sores.  Warm and dry    Results:   Cultures: Recent Results (from the past 720 hour(s))  SARS CORONAVIRUS 2 (TAT 6-24 HRS) Nasopharyngeal Nasopharyngeal Swab     Status: None   Collection Time: 03/26/21  6:57 AM   Specimen: Nasopharyngeal Swab  Result Value Ref Range Status   SARS Coronavirus 2 NEGATIVE NEGATIVE  Final    Comment: (NOTE) SARS-CoV-2 target nucleic acids are NOT DETECTED.  The SARS-CoV-2 RNA is generally detectable in upper and lower respiratory specimens during the acute phase of infection. Negative results do not preclude SARS-CoV-2 infection, do not rule out co-infections with other pathogens, and should not be used as the sole basis for treatment or other patient management decisions. Negative results must be combined with clinical observations, patient history, and epidemiological information. The expected result is Negative.  Fact Sheet for Patients: SugarRoll.be  Fact Sheet for Healthcare Providers: https://www.woods-mathews.com/  This test is not yet approved or cleared by the [place] FDA and  has been authorized for detection and/or diagnosis of SARS-CoV-2 by FDA under an Emergency Use Authorization (EUA). This EUA will remain  in effect (meaning this test can be used) for the duration of the COVID-19 declaration under Se ction 564(b)(1) of the Act, 21 U.S.C. section 360bbb-3(b)(1), unless the authorization is terminated or revoked sooner.  Performed at [facility], [place] [address] [place], [place] [address]     Labs: Results for orders placed or performed during the hospital encounter of 03/28/21 (from the past 48 hour(s))  Type and screen [place]     Status: None   Collection Time: 03/28/21  8:40 AM  Result Value Ref Range   ABO/RH(D) A POS    Antibody Screen NEG    Sample Expiration      03/31/2021,2359 Performed at [facility], [place] [address], [place], [place] [address]   Basic metabolic panel     Status: Abnormal   Collection Time: 03/29/21  4:41 AM  Result Value Ref Range   Sodium 134 (L) 135 - 145 mmol/L   Potassium 4.3 3.5 - 5.1 mmol/L   Chloride 104 98 - 111 mmol/L   CO2 26 22 - 32 mmol/L   Glucose, Bld 139 (H) 70 - 99 mg/dL    Comment: Glucose  reference range applies only to samples taken after fasting for at least 8 hours.   BUN 13 8 - 23 mg/dL   Creatinine, Ser 1.02 0.61 - 1.24 mg/dL   Calcium 8.3 (L) 8.9 - 10.3 mg/dL   GFR, Estimated >60 >60 mL/min    Comment: (NOTE) Calculated using the CKD-EPI Creatinine Equation (2021)    Anion gap 4 (L) 5 - 15    Comment: Performed at [facility], [place] [address], [place], [place] [address]  CBC     Status: Abnormal   Collection Time: 03/29/21  4:41 AM  Result Value Ref Range   WBC 13.5 (H) 4.0 - 10.5 K/uL   RBC 3.94 (L) 4.22 - 5.81 MIL/uL   Hemoglobin 12.5 (L) 13.0 - 17.0 g/dL   HCT 38.1 (L) 39.0 - 52.0 %   MCV 96.7 80.0 - 100.0 fL   MCH 31.7 26.0 - 34.0 pg   MCHC 32.8 30.0 - 36.0 g/dL   RDW 12.5 11.5 - 15.5 %   Platelets 179 150 - 400 K/uL   nRBC 0.0 0.0 - 0.2 %    Comment: Performed at [facility], [place] [address], [place], [place] [address]  Magnesium     Status: None   Collection Time: 03/29/21  4:41 AM  Result Value Ref Range   Magnesium 1.7 1.7 - 2.4 mg/dL    Comment: Performed at [facility], [place] [address], [place], [place] [address]  Creatinine, fluid (pleural, peritoneal, JP Drainage)     Status: None   Collection Time: 03/30/21  5:18 AM  Result Value Ref Range   Creat, Fluid 1.0 mg/dL    Comment: (NOTE) No normal range established for this test Results should be evaluated in conjunction with serum values    Fluid Type-FCRE JP DRAINAGE     Comment: Performed at [facility], [place] [address] [place],  [address]    Imaging / Studies: No results found.  Medications / Allergies: per chart  Antibiotics: Anti-infectives (From admission, onward)    Start     Dose/Rate Route Frequency Ordered Stop   03/28/21 2200  cefoTEtan (CEFOTAN) 2 g in sodium chloride 0.9 % 100 mL IVPB        2 g 200 mL/hr over 30 Minutes Intravenous Every 12 hours 03/28/21 1540 03/28/21 2220   03/28/21  1400  neomycin (MYCIFRADIN) tablet 1,000 mg  Status:  Discontinued       See Hyperspace for full Linked Orders Report.   1,000 mg Oral 3 times per day 03/28/21 0826 03/28/21 0842   03/28/21 1400  metroNIDAZOLE (FLAGYL) tablet 1,000 mg  Status:  Discontinued  See Hyperspace for full Linked Orders Report.   1,000 mg Oral 3 times per day 03/28/21 0826 03/28/21 0842   03/28/21 0830  cefoTEtan (CEFOTAN) 2 g in sodium chloride 0.9 % 100 mL IVPB        2 g 200 mL/hr over 30 Minutes Intravenous On call to O.R. 03/28/21 0826 03/28/21 1019         Note: Portions of this report may have been transcribed using voice recognition software. Every effort was made to ensure accuracy; however, inadvertent computerized transcription errors may be present.   Any transcriptional errors that result from this process are unintentional.    Adin Hector, MD, FACS, MASCRS Esophageal, Gastrointestinal & Colorectal Surgery Robotic and Minimally Invasive Surgery  [facility], [place]  [place]. [address], [place], [place] [address] [phone] Fax [phone] Main  CONTACT INFORMATION:  Weekday (9AM-5PM): Call [facility] main office at [phone]  Weeknight (5PM-9AM) or Weekend/Holiday: Check [email or web address] (password " [email or web address]") for General Surgery [facility] coverage  (Please, do not use SecureChat as it is not reliable communication to operating surgeons for immediate patient care)      03/30/2021  7:52 AM

## 2021-03-30 NOTE — Progress Notes (Signed)
Pt prefer self placement on the cpap. His home unit. Machine is ready to be worn.

## 2021-03-31 MED ORDER — OXYCODONE HCL 5 MG PO TABS: 5.0000 mg | ORAL_TABLET | Freq: Four times a day (QID) | ORAL | 0 refills | Status: DC | PRN

## 2021-03-31 MED ORDER — GABAPENTIN 100 MG PO CAPS: 200.0000 mg | ORAL_CAPSULE | Freq: Three times a day (TID) | ORAL | 2 refills | Status: DC

## 2021-03-31 NOTE — Discharge Summary (Signed)
Physician Discharge Summary    Patient ID: Zachary Montoya MRN: [id number] DOB/AGE: 07/25/1945  76 y.o.  Patient Care Team: Crist Infante, MD as PCP - General (Internal Medicine) Constance Haw, MD as PCP - Electrophysiology (Cardiology) Warren Danes, PA-C as Physician Assistant (Dermatology) Michael Boston, MD as Consulting Physician (Colon and Rectal Surgery) Clarene Essex, MD as Consulting Physician (Gastroenterology) Peri Jefferson, FNP (Pulmonary Disease) Jolene Provost, PA-C as Physician Assistant (Otolaryngology) Jettie Booze, MD as Consulting Physician (Cardiology)  Admit date: 03/28/2021  Discharge date: 03/31/2021  Hospital Stay = 3 days    Discharge Diagnoses:  Principal Problem:   Diverticulitis of large intestine with abscess Active Problems:   Hyperlipidemia   Paroxysmal atrial fibrillation (HCC)   Asymmetric SNHL (sensorineural hearing loss)   Essential hypertension   3 Days Post-Op  03/28/2021  POST-OPERATIVE DIAGNOSIS:   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 PRIMARY REPAIR INCARCERATED PERIUMBILICAL INCISIONAL HERNIA - 2cm RIGID PROCTOSCOPY   SURGEON:  Adin Hector, MD   OR FINDINGS:    Patient had chronically thickened distal descending colon as well as proximal sigmoid colon somewhat twisted and folded upon itself.  Redundant sigmoid colon.   The anastomosis rests 15 cm from the anal verge by rigid proctoscopy.  Is a 8 EEA mid-descending to proximal rectum anastomosis.   69mm cystotomy at very high dome of the bladder which going almost up to the umbilicus.  Primary repair done.  No leak on insufflation.  Drain left in place.   2cm periumbilical incisional hernia incarcerated with omentum and falciform ligament.  Reduction and  transverse primary repair done with #1 PDS.    Consults: Urology  Hospital Course:   The patient underwent the surgery above.  Postoperatively, the patient gradually mobilized and advanced to a solid diet.  Pain and other symptoms were treated aggressively.  Drain creatinine was negative for any leak so it was removed on postoperative day #2.  By the time of discharge, the patient was walking well the hallways, eating food, having flatus.  Pain was well-controlled on an oral medications -gabapentin and oxycodone with Tylenol worked well.  Patient transition to a leg bag for his Foley catheter..  Based on meeting discharge criteria and continuing to recover, I felt it was safe for the patient to be discharged from the hospital to further recover with close followup.  Plan outpatient cystogram at radiology department to make sure bladder repair intact and Foley can safely be removed.  Postoperative recommendations were discussed in detail.  They are written as well.  Discharged Condition: good  Discharge Exam: Blood pressure 126/71, pulse (!) 54, temperature 98.4 F (36.9 C), temperature source Oral, resp. rate 15, height 5\' 8"  (1.727 m), weight 89.3 kg, SpO2 92 %.  General: Pt awake/alert/oriented x4 in No acute distress Eyes: PERRL, normal EOM.  Sclera clear.  No icterus Neuro: CN II-XII intact w/o focal sensory/motor deficits. Lymph: No head/neck/groin lymphadenopathy Psych:  No delerium/psychosis/paranoia HENT: Normocephalic, Mucus membranes moist.  No thrush Neck: Supple, No tracheal deviation Chest:  No chest wall pain w good excursion CV:  Pulses intact.  Regular rhythm MS: Normal AROM mjr joints.  No obvious deformity Abdomen: Soft.  Nondistended.  Mildly tender at incisions only.  No evidence of peritonitis.  No incarcerated hernias. Ext:  SCDs BLE.  No mjr edema.  No  cyanosis Skin: No petechiae / purpura   Disposition:     Discharge disposition: 01-Home or Self  Care       Discharge Instructions     Call MD for:   Complete by: As directed    FEVER > 101.5 F  (temperatures < 101.5 F are not significant)   Call MD for:  extreme fatigue   Complete by: As directed    Call MD for:  persistant dizziness or light-headedness   Complete by: As directed    Call MD for:  persistant nausea and vomiting   Complete by: As directed    Call MD for:  redness, tenderness, or signs of infection (pain, swelling, redness, odor or green/yellow discharge around incision site)   Complete by: As directed    Call MD for:  severe uncontrolled pain   Complete by: As directed    Diet - low sodium heart healthy   Complete by: As directed    Start with a bland diet such as soups, liquids, starchy foods, low fat foods, etc. the first few days at home. Gradually advance to a solid, low-fat, high fiber diet by the end of the first week at home.   Add a fiber supplement to your diet (Metamucil, etc) If you feel full, bloated, or constipated, stay on a full liquid or pureed/blenderized diet for a few days until you feel better and are no longer constipated.   Discharge instructions   Complete by: As directed    See Discharge Instructions If you are not getting better after two weeks or are noticing you are getting worse, contact our office [phone] for further advice.  We may need to adjust your medications, re-evaluate you in the office, send you to the emergency room, or see what other things we can do to help. The clinic staff is available to answer your questions during regular business hours (8:30am-5pm).  Please don't hesitate to call and ask to speak to one of our nurses for clinical concerns.    A surgeon from [facility] is always on call at the hospitals 24 hours/day If you have a medical emergency, go to the nearest emergency room or call 911.   Discharge wound care:   Complete by: As directed    t is good for closed incisions and even open  wounds to be washed every day.  Shower every day.  Short baths are fine.  Wash the incisions and wounds clean with soap & water.    You may leave closed incisions open to air if it is dry.   You may cover the incision with clean gauze & replace it after your daily shower for comfort.   Driving Restrictions   Complete by: As directed    You may drive when: - you are no longer taking narcotic prescription pain medication - you can comfortably wear a seatbelt - you can safely make sudden turns/stops without pain.   Increase activity slowly   Complete by: As directed    Start light daily activities --- self-care, walking, climbing stairs- beginning the day after surgery.  Gradually increase activities as tolerated.  Control your pain to be active.  Stop when you are tired.  Ideally, walk several times a day, eventually an hour a day.   Most people are back to most day-to-day activities in a few weeks.  It takes 4-6 weeks to get back to unrestricted, intense activity. If you can walk 30 minutes without difficulty, it  is safe to try more intense activity such as jogging, treadmill, bicycling, low-impact aerobics, swimming, etc. Save the most intensive and strenuous activity for last (Usually 4-8 weeks after surgery) such as sit-ups, heavy lifting, contact sports, etc.  Refrain from any intense heavy lifting or straining until you are off narcotics for pain control.  You will have off days, but things should improve week-by-week. DO NOT PUSH THROUGH PAIN.  Let pain be your guide: If it hurts to do something, don't do it.   Lifting restrictions   Complete by: As directed    If you can walk 30 minutes without difficulty, it is safe to try more intense activity such as jogging, treadmill, bicycling, low-impact aerobics, swimming, etc. Save the most intensive and strenuous activity for last (Usually 4-8 weeks after surgery) such as sit-ups, heavy lifting, contact sports, etc.   Refrain from any intense heavy  lifting or straining until you are off narcotics for pain control.  You will have off days, but things should improve week-by-week. DO NOT PUSH THROUGH PAIN.  Let pain be your guide: If it hurts to do something, don't do it.  Pain is your body warning you to avoid that activity for another week until the pain goes down.   May shower / Bathe   Complete by: As directed    May walk up steps   Complete by: As directed    Remove dressing in 72 hours   Complete by: As directed    Make sure all dressings are removed by the third day after surgery.  Leave incisions open to air.  OK to cover incisions with gauze or bandages as desired   Sexual Activity Restrictions   Complete by: As directed    You may have sexual intercourse when it is comfortable. If it hurts to do something, stop.       Allergies as of 03/31/2021       Reactions   Bee Venom Itching, Swelling   Timolol Other (See Comments)   Bradycardia/dizziness Tolerates metoprolol fine   Brimonidine Tartrate Other (See Comments)   Burning, itching, and discomfort    Ciprofloxacin Other (See Comments)   Joint pain   Codeine Nausea And Vomiting        Medication List     TAKE these medications    acetaminophen 650 MG CR tablet Commonly known as: TYLENOL Take 650-1,300 mg by mouth every 8 (eight) hours as needed for pain.   aspirin 81 MG chewable tablet Chew 1 tablet (81 mg total) by mouth daily. What changed: when to take this   atorvastatin 20 MG tablet Commonly known as: LIPITOR Take 20 mg by mouth daily.   B-complex with vitamin C tablet Take 1 tablet by mouth daily.   CENTRUM ADULTS PO Take 1 tablet by mouth daily.   cetirizine 10 MG tablet Commonly known as: ZYRTEC Take 10 mg by mouth daily.   cholecalciferol 25 MCG (1000 UNIT) tablet Commonly known as: VITAMIN D Take 1,000 Units by mouth daily.   clopidogrel 75 MG tablet Commonly known as: PLAVIX Take 1 tablet (75 mg total) by mouth daily. What changed:  when to take this   CoQ10 200 MG Caps Take 200 mg by mouth daily.   ferrous sulfate 325 (65 FE) MG tablet Take 325 mg by mouth daily.   flecainide 100 MG tablet Commonly known as: TAMBOCOR Take 1 tablet (100 mg total) by mouth 2 (two) times daily.   Fluad Quadrivalent 0.5 ML  injection Generic drug: influenza vaccine adjuvanted Inject into the muscle.   gabapentin 100 MG capsule Commonly known as: NEURONTIN Take 2 capsules (200 mg total) by mouth 3 (three) times daily.   metoprolol succinate 25 MG 24 hr tablet Commonly known as: TOPROL-XL Take 1 tablet (25 mg total) by mouth daily. What changed: when to take this   Omega 3 1000 MG Caps Take 1,000 mg by mouth daily.   oxyCODONE 5 MG immediate release tablet Commonly known as: Oxy IR/ROXICODONE Take 1 tablet (5 mg total) by mouth every 6 (six) hours as needed for moderate pain, severe pain or breakthrough pain.   pantoprazole 40 MG tablet Commonly known as: PROTONIX Take 1 tablet (40 mg total) by mouth daily.   Pfizer COVID-19 Vac Bivalent injection Generic drug: COVID-19 mRNA bivalent vaccine Therapist, music) Inject into the muscle.   polyethylene glycol 17 g packet Commonly known as: MIRALAX / GLYCOLAX Take 17 g by mouth daily.   triamcinolone 55 MCG/ACT Aero nasal inhaler Commonly known as: NASACORT Place 1 spray into the nose daily as needed (allergies).               Discharge Care Instructions  (From admission, onward)           Start     Ordered   03/31/21 0000  Discharge wound care:       Comments: t is good for closed incisions and even open wounds to be washed every day.  Shower every day.  Short baths are fine.  Wash the incisions and wounds clean with soap & water.    You may leave closed incisions open to air if it is dry.   You may cover the incision with clean gauze & replace it after your daily shower for comfort.   03/31/21 0907            Significant Diagnostic Studies:  Results for  orders placed or performed during the hospital encounter of 03/28/21 (from the past 72 hour(s))  Surgical pathology     Status: None   Collection Time: 03/28/21 11:38 AM  Result Value Ref Range   SURGICAL PATHOLOGY      SURGICAL PATHOLOGY CASE: [id number] PATIENT: Darrin Luis Surgical Pathology Report     Clinical History: Diverticulitis (kc)     FINAL MICROSCOPIC DIAGNOSIS:  A. COLON, DESCENDING, SIGMOID, RESECTION: - Segment of colon (13.5 cm) with diverticulosis and diverticulitis - Margins appear viable  B. COLON, FINAL DISTAL, MARGIN: - Colonic donut with no specific histopathologic changes      Richie Vadala DESCRIPTION:  A: The specimen is received fresh labeled descending and sigmoid colon, and consists of a portion of colon with 1 stapled and one open margin measuring 13.5 cm in length.  The serosal surface is tan-pink with attached tan-yellow adipose tissue, and mild adhesions.  Per the requisition the open end is proximal.  Lumen is filled with a moderate amount of tan-yellow fecal material.  Mucosa is tan-pink with normal folding, and multiple diverticuli are noted along the length of the specimen.  The wall ranges from 0.3 to 0.6 cm in thickness.  No enlarged  lymph nodes are grossly identified.  Representative sections are submitted in 4 cassettes. 1 = proximal margin 2 = distal margin 3 and 4 = diverticuli  B: The specimen is received fresh and consists of a circular portion of tan mucosa, measuring 2.5 x 1.6 x 1.1 cm.  The specimen displays a staple line.  Representative sections are submitted  in 1 cassette.  Craig Staggers 03/29/2021)    Final Diagnosis performed by Jaquita Folds, MD.   Electronically signed 03/30/2021 Technical and / or Professional components performed at [place] [address] [place], [place] [address].  Immunohistochemistry Technical component (if applicable) was performed at [facility]. [address] [place], [place], [place] [address].   IMMUNOHISTOCHEMISTRY DISCLAIMER (if applicable): Some of these immunohistochemical stains may have been developed and the performance characteristics determine by [facility]. Some may not have been cleared or  approved by the U.S. Food and Drug Administration. The FDA has determined that such clearance or approval is not necessary. This test is used for clinical purposes. It should not be regarded as investigational or for research. This laboratory is certified under the [place] (CLIA-88) as qualified to perform high complexity clinical laboratory testing.  The controls stained appropriately.   Basic metabolic panel     Status: Abnormal   Collection Time: 03/29/21  4:41 AM  Result Value Ref Range   Sodium 134 (L) 135 - 145 mmol/L   Potassium 4.3 3.5 - 5.1 mmol/L   Chloride 104 98 - 111 mmol/L   CO2 26 22 - 32 mmol/L   Glucose, Bld 139 (H) 70 - 99 mg/dL    Comment: Glucose reference range applies only to samples taken after fasting for at least 8 hours.   BUN 13 8 - 23 mg/dL   Creatinine, Ser 1.02 0.61 - 1.24 mg/dL   Calcium 8.3 (L) 8.9 - 10.3 mg/dL   GFR, Estimated >60 >60 mL/min    Comment: (NOTE) Calculated using the CKD-EPI Creatinine Equation (2021)    Anion gap 4 (L) 5 - 15    Comment: Performed at [facility], [place] [address], [place], [place] [address]  CBC     Status: Abnormal   Collection Time: 03/29/21  4:41 AM  Result Value Ref Range   WBC 13.5 (H) 4.0 - 10.5 K/uL   RBC 3.94 (L) 4.22 - 5.81 MIL/uL   Hemoglobin 12.5 (L) 13.0 - 17.0 g/dL   HCT 38.1 (L) 39.0 - 52.0 %   MCV 96.7 80.0 - 100.0 fL   MCH 31.7 26.0 - 34.0 pg   MCHC 32.8 30.0 - 36.0 g/dL   RDW 12.5 11.5 - 15.5 %   Platelets 179 150 - 400 K/uL   nRBC 0.0 0.0 - 0.2 %    Comment: Performed at [facility], [place] [address], [place], [place] [address]  Magnesium      Status: None   Collection Time: 03/29/21  4:41 AM  Result Value Ref Range   Magnesium 1.7 1.7 - 2.4 mg/dL    Comment: Performed at [facility], [place] [address], [place], [place] [address]  Creatinine, fluid (pleural, peritoneal, JP Drainage)     Status: None   Collection Time: 03/30/21  5:18 AM  Result Value Ref Range   Creat, Fluid 1.0 mg/dL    Comment: (NOTE) No normal range established for this test Results should be evaluated in conjunction with serum values    Fluid Type-FCRE JP DRAINAGE     Comment: Performed at [facility], [place] [address] [place],  [address]    No results found.  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   Left Posterior Neck   Sleep apnea     Past Surgical History:  Procedure Laterality Date   APPENDECTOMY     COLONOSCOPY WITH PROPOFOL N/A 03/27/2021   Procedure: COLONOSCOPY WITH PROPOFOL;  Surgeon: Clarene Essex, MD;  Location: [facility];  Service: Endoscopy;  Laterality: N/A;   ESOPHAGOGASTRIC FUNDOPLICATION     LEFT ATRIAL APPENDAGE OCCLUSION N/A 10/26/2020   27 mm Watchman left atrial appendage occlusion device   POLYPECTOMY  03/27/2021   Procedure: POLYPECTOMY;  Surgeon: Clarene Essex, MD;  Location: [facility];  Service: Endoscopy;;   PROCTOSCOPY N/A 03/28/2021   Procedure: RIGID PROCTOSCOPY;  Surgeon: Michael Boston, MD;  Location: [facility];  Service: General;  Laterality: N/A;   TEE WITHOUT CARDIOVERSION N/A 10/26/2020   Procedure: TRANSESOPHAGEAL ECHOCARDIOGRAM (TEE);  Surgeon: Vickie Epley, MD;  Location: [facility];  Service: Cardiovascular;  Laterality: N/A;   TEE WITHOUT CARDIOVERSION N/A 12/07/2020   Procedure: TRANSESOPHAGEAL ECHOCARDIOGRAM (TEE);  Surgeon: Elouise Munroe, MD;  Location: [facility];  Service: Cardiology;  Laterality: N/A;   UMBILICAL HERNIA REPAIR N/A 03/28/2021   Procedure: PRIMARY REPAIR INCERCERATED UMBILICAL HERNIA 2CM;  Surgeon: Michael Boston, MD;  Location: [facility];  Service: General;  Laterality: N/A;    Social History   Socioeconomic History   Marital status: Married    Spouse name: Not on file   Number of children: Not on file   Years of education: Not on file   Highest education level: Not on file  Occupational History   Not on file  Tobacco Use   Smoking status: Never   Smokeless tobacco: Never  Vaping Use   Vaping Use: Never used  Substance and Sexual Activity   Alcohol use: Yes    Alcohol/week: 1.0 standard drink    Types: 1 Cans of beer per week    Comment: occas.   Drug use: No   Sexual activity: Not on file  Other Topics Concern   Not on file  Social History Narrative   Not on file   Social Determinants of Health   Financial Resource Strain: Not on file  Food Insecurity: Not on file  Transportation Needs: Not on file  Physical Activity: Not on file  Stress: Not on file  Social Connections: Not on file  Intimate Partner Violence: Not on file    Family History  Problem Relation Age of Onset   Diabetes Mother    Hypertension Mother    Stroke Mother    Cancer Brother    Heart attack Neg Hx     Current Facility-Administered Medications  Medication Dose Route Frequency Provider Last Rate Last Admin   0.9 %  sodium chloride infusion  250 mL Intravenous PRN Michael Boston, MD       acetaminophen (TYLENOL) tablet 1,000 mg  1,000 mg Oral Q6H Michael Boston, MD   1,000 mg at 03/31/21 0636   albuterol (PROVENTIL) (2.5 MG/3ML) 0.083% nebulizer solution 2.5 mg  2.5 mg Nebulization Q6H PRN Michael Boston, MD       alum & mag hydroxide-simeth  (MAALOX/MYLANTA) 200-200-20 MG/5ML suspension 30 mL  30 mL Oral Q6H PRN Michael Boston, MD       alvimopan (ENTEREG) capsule 12 mg  12 mg Oral BID Michael Boston,  MD   12 mg at 03/30/21 8676   aspirin chewable tablet 81 mg  81 mg Oral QPM Michael Boston, MD   81 mg at 03/30/21 1922   atorvastatin (LIPITOR) tablet 20 mg  20 mg Oral Daily Michael Boston, MD   20 mg at 03/30/21 1950   Chlorhexidine Gluconate Cloth 2 % PADS 6 each  6 each Topical Daily Michael Boston, MD   6 each at 03/30/21 0934   diphenhydrAMINE (BENADRYL) 12.5 MG/5ML elixir 12.5 mg  12.5 mg Oral Q6H PRN Michael Boston, MD       Or   diphenhydrAMINE (BENADRYL) injection 12.5 mg  12.5 mg Intravenous Q6H PRN Michael Boston, MD       enalaprilat (VASOTEC) injection 0.625-1.25 mg  0.625-1.25 mg Intravenous Q6H PRN Michael Boston, MD       enoxaparin (LOVENOX) injection 40 mg  40 mg Subcutaneous Q24H Michael Boston, MD   40 mg at 03/31/21 0854   feeding supplement (ENSURE SURGERY) liquid 237 mL  237 mL Oral BID BM Michael Boston, MD   237 mL at 03/29/21 1007   flecainide (TAMBOCOR) tablet 100 mg  100 mg Oral BID Michael Boston, MD   100 mg at 03/30/21 2105   gabapentin (NEURONTIN) capsule 200 mg  200 mg Oral TID Michael Boston, MD   200 mg at 03/30/21 2104   HYDROmorphone (DILAUDID) injection 0.5-2 mg  0.5-2 mg Intravenous Q4H PRN Michael Boston, MD   1 mg at 03/30/21 0122   lip balm (CARMEX) ointment 1 application  1 application Topical BID Michael Boston, MD   1 application at 93/26/71 2107   loratadine (CLARITIN) tablet 10 mg  10 mg Oral Daily Michael Boston, MD   10 mg at 03/30/21 2458   magic mouthwash  15 mL Oral QID PRN Michael Boston, MD       melatonin tablet 3 mg  3 mg Oral QHS PRN Michael Boston, MD   3 mg at 03/30/21 2104   methocarbamol (ROBAXIN) 1,000 mg in dextrose 5 % 100 mL IVPB  1,000 mg Intravenous Q6H PRN Michael Boston, MD       methocarbamol (ROBAXIN) tablet 1,000 mg  1,000 mg Oral Q6H PRN Michael Boston, MD       metoprolol  succinate (TOPROL-XL) 24 hr tablet 25 mg  25 mg Oral Ardeen Fillers, MD   25 mg at 03/30/21 2105   metoprolol tartrate (LOPRESSOR) injection 5 mg  5 mg Intravenous Q6H PRN Michael Boston, MD       nystatin (MYCOSTATIN/NYSTOP) topical powder   Topical TID Michael Boston, MD   Given at 03/30/21 2106   ondansetron (ZOFRAN) tablet 4 mg  4 mg Oral Q6H PRN Michael Boston, MD       Or   ondansetron [facility]) injection 4 mg  4 mg Intravenous Q6H PRN Michael Boston, MD   4 mg at 03/28/21 2141   oxyCODONE (Oxy IR/ROXICODONE) immediate release tablet 5-10 mg  5-10 mg Oral Q4H PRN Michael Boston, MD   5 mg at 03/30/21 1357   pantoprazole (PROTONIX) EC tablet 40 mg  40 mg Oral Daily Michael Boston, MD   40 mg at 03/30/21 0934   polyethylene glycol (MIRALAX / GLYCOLAX) packet 17 g  17 g Oral Daily Michael Boston, MD   17 g at 03/30/21 0998   prochlorperazine (COMPAZINE) tablet 10 mg  10 mg Oral Q6H PRN Michael Boston, MD       Or   prochlorperazine (COMPAZINE) injection  5-10 mg  5-10 mg Intravenous Q6H PRN Michael Boston, MD       simethicone [facility]) chewable tablet 40 mg  40 mg Oral Q6H PRN Michael Boston, MD       sodium chloride flush (NS) 0.9 % injection 3 mL  3 mL Intravenous Gorden Harms, MD   3 mL at 03/30/21 2106   sodium chloride flush (NS) 0.9 % injection 3 mL  3 mL Intravenous PRN Michael Boston, MD       triamcinolone (NASACORT) nasal inhaler 1 spray  1 spray Nasal Daily PRN Michael Boston, MD         Allergies  Allergen Reactions   Bee Venom Itching and Swelling   Timolol Other (See Comments)    Bradycardia/dizziness Tolerates metoprolol fine   Brimonidine Tartrate Other (See Comments)    Burning, itching, and discomfort    Ciprofloxacin Other (See Comments)    Joint pain   Codeine Nausea And Vomiting    Signed: Morton Peters, MD, FACS, MASCRS Esophageal, Gastrointestinal & Colorectal Surgery Robotic and Minimally Invasive Surgery  [facility], [place]  [address], [place], [place] [address] [phone] Fax [phone] Main  CONTACT INFORMATION:  Weekday (9AM-5PM): Call [facility] main office at [phone]  Weeknight (5PM-9AM) or Weekend/Holiday: Check [email or web address] (password " [email or web address]") for General Surgery [facility] coverage  (Please, do not use SecureChat as it is not reliable communication to operating surgeons for immediate patient care)      03/31/2021, 9:07 AM

## 2021-04-01 NOTE — Progress Notes (Signed)
Patient status post robotic distal left colectomy. Path benign = consistent with diverticulitis/diverticulosis as suspected. I told the pt the good news

## 2021-04-03 ENCOUNTER — Other Ambulatory Visit (HOSPITAL_COMMUNITY): Payer: Self-pay | Admitting: Surgery

## 2021-04-03 ENCOUNTER — Other Ambulatory Visit: Payer: Self-pay | Admitting: Surgery

## 2021-04-03 DIAGNOSIS — Z9889 Other specified postprocedural states: Secondary | ICD-10-CM

## 2021-04-04 ENCOUNTER — Other Ambulatory Visit: Payer: Self-pay

## 2021-04-04 ENCOUNTER — Ambulatory Visit (HOSPITAL_COMMUNITY)
Admission: RE | Admit: 2021-04-04 | Discharge: 2021-04-04 | Disposition: A | Discharge status: Home / Self Care | Payer: Medicare HMO | Source: Ambulatory Visit | Attending: Surgery | Admitting: Surgery

## 2021-04-04 DIAGNOSIS — Z9049 Acquired absence of other specified parts of digestive tract: Secondary | ICD-10-CM | POA: Diagnosis not present

## 2021-04-04 DIAGNOSIS — S3729XA Other injury of bladder, initial encounter: Secondary | ICD-10-CM | POA: Diagnosis not present

## 2021-04-04 DIAGNOSIS — Y838 Other surgical procedures as the cause of abnormal reaction of the patient, or of later complication, without mention of misadventure at the time of the procedure: Secondary | ICD-10-CM | POA: Insufficient documentation

## 2021-04-04 DIAGNOSIS — N323 Diverticulum of bladder: Secondary | ICD-10-CM | POA: Insufficient documentation

## 2021-04-04 DIAGNOSIS — Z9889 Other specified postprocedural states: Secondary | ICD-10-CM | POA: Diagnosis not present

## 2021-04-04 MED ORDER — IOTHALAMATE MEGLUMINE 17.2 % UR SOLN
250.0000 mL | Freq: Once | URETHRAL | Status: AC | PRN
  Administered 2021-04-04: 250 mL via INTRAVESICAL

## 2021-04-12 DIAGNOSIS — G4733 Obstructive sleep apnea (adult) (pediatric): Secondary | ICD-10-CM | POA: Diagnosis not present

## 2021-04-12 DIAGNOSIS — K5792 Diverticulitis of intestine, part unspecified, without perforation or abscess without bleeding: Secondary | ICD-10-CM | POA: Diagnosis not present

## 2021-04-12 DIAGNOSIS — K219 Gastro-esophageal reflux disease without esophagitis: Secondary | ICD-10-CM | POA: Diagnosis not present

## 2021-04-12 DIAGNOSIS — I480 Paroxysmal atrial fibrillation: Secondary | ICD-10-CM | POA: Diagnosis not present

## 2021-04-12 DIAGNOSIS — R350 Frequency of micturition: Secondary | ICD-10-CM | POA: Diagnosis not present

## 2021-04-20 NOTE — Progress Notes (Signed)
[facility]                                     Cardiology Office Note:    Date:  04/23/2021   ID:  Zachary Montoya, DOB [birth year], MRN [id number]  PCP:  Crist Infante, MD  [facility] Cardiologist:  None  [facility] Electrophysiologist:  Will Meredith Leeds, MD   Referring MD: Crist Infante, MD   Chief Complaint  Patient presents with   Follow-up    6 month s/p Watchman implant    History of Present Illness:    Zachary Montoya is a 76 y.o. male with a hx of obesity, OSA, HTN, PAF and poor candidacy for long term anticoagulation due to high risk occupation who underwent successful LAAO with a Watchman Flex on 10/26/20 (04/25/21>>6 months) who presents to clinic for 6 month follow up.   As above, he underwent successful LAAO with a 27 mm Watchman FLX by Dr. Quentin Ore on 10/26/20. Intraoperative TEE showed a well seated device with 22% compression and no peripheral leak. He was discharged on Eliquis 5mg  BID and ASA 81 mg for 45 days and was transitioned to DAPT with ASA/Plavix after 45 day TEE showed an adequate seal.   Today he is here alone and reports that he was recently discharged from the hospital after colectomy secondary to diverticulitis of large intestine with abscess. He continues to recover slowly from this and has been walking about 1 ,mile per day despite recent surgery. He has not chest pain, LE edema, palpitations, dizziness, or syncope. He has tolerated ASA and Plavix with no bleeding. He did hold Plavix per surgery request however has since restarted.   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   Right Scaphoid Fossa   PAF (paroxysmal atrial fibrillation) (HCC)    SCCA (squamous cell carcinoma) of skin  07/26/2020   Left Posterior Neck   Sleep apnea     Past Surgical History:  Procedure Laterality Date   APPENDECTOMY     COLONOSCOPY WITH PROPOFOL N/A 03/27/2021   Procedure: COLONOSCOPY WITH PROPOFOL;  Surgeon: Clarene Essex, MD;  Location: [facility];  Service: Endoscopy;  Laterality: N/A;   ESOPHAGOGASTRIC FUNDOPLICATION     LEFT ATRIAL APPENDAGE OCCLUSION N/A 10/26/2020   27 mm Watchman left atrial appendage occlusion device   POLYPECTOMY  03/27/2021   Procedure: POLYPECTOMY;  Surgeon: Clarene Essex, MD;  Location: [facility];  Service: Endoscopy;;   PROCTOSCOPY N/A 03/28/2021   Procedure: RIGID PROCTOSCOPY;  Surgeon: Michael Boston, MD;  Location: [facility];  Service: General;  Laterality: N/A;   TEE WITHOUT CARDIOVERSION N/A 10/26/2020   Procedure: TRANSESOPHAGEAL ECHOCARDIOGRAM (TEE);  Surgeon: Vickie Epley, MD;  Location: [place] [facility];  Service: Cardiovascular;  Laterality: N/A;   TEE WITHOUT CARDIOVERSION N/A 12/07/2020   Procedure: TRANSESOPHAGEAL ECHOCARDIOGRAM (TEE);  Surgeon: Elouise Munroe, MD;  Location: [place];  Service: Cardiology;  Laterality: N/A;   UMBILICAL HERNIA REPAIR N/A 03/28/2021   Procedure: PRIMARY REPAIR INCERCERATED UMBILICAL HERNIA 2CM;  Surgeon: Michael Boston, MD;  Location: [facility];  Service: General;  Laterality: N/A;    Current Medications: Current Meds  Medication Sig  acetaminophen (TYLENOL) 650 MG CR tablet Take 650-1,300 mg by mouth every 8 (eight) hours as needed for pain.   aspirin 81 MG chewable tablet Chew 1 tablet (81 mg total) by mouth daily.   atorvastatin (LIPITOR) 20 MG tablet Take 20 mg by mouth daily.   B Complex-C (B-COMPLEX WITH VITAMIN C) tablet Take 1 tablet by mouth daily.   cetirizine (ZYRTEC) 10 MG tablet Take 10 mg by mouth daily.   cholecalciferol (VITAMIN D) 25 MCG (1000 UNIT) tablet Take 1,000 Units by mouth daily.   Coenzyme Q10 (COQ10) 200 MG CAPS Take 200 mg by mouth daily.   COVID-19 mRNA bivalent vaccine,  Pfizer, (PFIZER COVID-19 VAC BIVALENT) injection Inject into the muscle.   ferrous sulfate 325 (65 FE) MG tablet Take 325 mg by mouth daily.   flecainide (TAMBOCOR) 100 MG tablet Take 1 tablet (100 mg total) by mouth 2 (two) times daily.   influenza vaccine adjuvanted (FLUAD QUADRIVALENT) 0.5 ML injection Inject into the muscle.   metoprolol succinate (TOPROL-XL) 25 MG 24 hr tablet Take 1 tablet (25 mg total) by mouth daily.   Multiple Vitamins-Minerals (CENTRUM ADULTS PO) Take 1 tablet by mouth daily.   Omega 3 1000 MG CAPS Take 1,000 mg by mouth daily.   pantoprazole (PROTONIX) 40 MG tablet Take 1 tablet (40 mg total) by mouth daily.   polyethylene glycol (MIRALAX / GLYCOLAX) 17 g packet Take 17 g by mouth daily.   triamcinolone (NASACORT) 55 MCG/ACT AERO nasal inhaler Place 1 spray into the nose daily as needed (allergies).   [DISCONTINUED] clopidogrel (PLAVIX) 75 MG tablet Take 1 tablet (75 mg total) by mouth daily.     Allergies:   Bee venom, Timolol, Brimonidine tartrate, Ciprofloxacin, and Codeine   Social History   Socioeconomic History   Marital status: Married    Spouse name: Not on file   Number of children: Not on file   Years of education: Not on file   Highest education level: Not on file  Occupational History   Not on file  Tobacco Use   Smoking status: Never   Smokeless tobacco: Never  Vaping Use   Vaping Use: Never used  Substance and Sexual Activity   Alcohol use: Yes    Alcohol/week: 1.0 standard drink    Types: 1 Cans of beer per week    Comment: occas.   Drug use: No   Sexual activity: Not on file  Other Topics Concern   Not on file  Social History Narrative   Not on file   Social Determinants of Health   Financial Resource Strain: Not on file  Food Insecurity: Not on file  Transportation Needs: Not on file  Physical Activity: Not on file  Stress: Not on file  Social Connections: Not on file     Family History: The patient's family history  includes Cancer in his brother; Diabetes in his mother; Hypertension in his mother; Stroke in his mother. There is no history of Heart attack.  ROS:   Please see the history of present illness.    All other systems reviewed and are negative.  EKGs/Labs/Other Studies Reviewed:    The following studies were reviewed today:  TEE 10/26/20 Transeptal Puncture Intra-procedural TEE which showed no LAA thrombus Left atrial appendage occlusive device placement on 10/26/20 by Dr. Quentin Ore.  This study demonstrated: IMPRESSIONS   1. Left ventricular ejection fraction, by estimation, is 60 to 65%. The  left ventricle has normal function. The left ventricle has  no regional  wall motion abnormalities.   2. Right ventricular systolic function is normal. The right ventricular  size is normal.   3. Left atrial appendage windsock morphology with average ostial diameter  of 21 mm. A 27 mm Watchman FLX device is implanted, is stable and well  seated with 22% compression and no peripheral leak. 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  Left atrial size was mildly dilated. No left atrial/left atrial appendage  thrombus was detected.   4. The mitral valve is normal in structure. Trivial mitral valve  regurgitation. No evidence of mitral stenosis.   5. The aortic valve is tricuspid. Aortic valve regurgitation is trivial.  No aortic stenosis is present.   6. The inferior vena cava is normal in size with greater than 50%  respiratory variability, suggesting right atrial pressure of 3 mmHg.   Conclusion(s)/Recommendation(s): TEE was used for pre-procedure  measurements and planning, guidance of transseptal puncture, device  deployment, post procedure assessment , including 3D reconstruction of  images.    ______________________   LAAO 10/26/20 CONCLUSIONS:  1.Successful implantation of a WATCHMAN left atrial  appendage occlusive device    2. TEE demonstrating no LAA thrombus 3. No early apparent complications.  4. Plan for Apixaban 5mg  PO BID and Aspirin 81mg  PO daily until the 45 day follow up. If no leak or thrombus at that TEE will transition to Aspirin 81mg  PO daily and Plavix 75mg  PO daily.     EKG:  EKG is not ordered today.    Recent Labs: 12/22/2020: ALT 24 03/29/2021: BUN 13; Creatinine, Ser 1.02; Hemoglobin 12.5; Magnesium 1.7; Platelets 179; Potassium 4.3; Sodium 134  Recent Lipid Panel No results found for: CHOL, TRIG, HDL, CHOLHDL, VLDL, LDLCALC, LDLDIRECT  Physical Exam:    VS:  BP 132/74    Pulse (!) 58    Ht 5\' 8"  (1.727 m)    Wt 191 lb 12.8 oz (87 kg)    SpO2 98%    BMI 29.16 kg/m     Wt Readings from Last 3 Encounters:  04/23/21 191 lb 12.8 oz (87 kg)  03/30/21 196 lb 13.9 oz (89.3 kg)  03/27/21 199 lb 1.2 oz (90.3 kg)    General: Well developed, well nourished, NAD Lungs:Clear to ausculation bilaterally. No wheezes, rales, or rhonchi. Breathing is unlabored. Cardiovascular: RRR with S1 S2. No murmurs Extremities: No edema.  Neuro: Alert and oriented. No focal deficits. No facial asymmetry. MAE spontaneously. Psych: Responds to questions appropriately with normal affect.    ASSESSMENT/PLAN:    PAF: Doing well with no recurrent AF after starting Flecainide 100 mg BID. Underwent LAAO with a 27 mm Watchman FLX by Dr. Quentin Ore on 10/26/20. He was transitioned to DAPT with ASA and Plavix after TEE showed well seated device with no leaking. He will now stop his Plavix and continue ASA indefinitely. No need for SBE after 04/25/21.     HTN: BP well controlled. No changes made today.    OSA: Continue CPAP  Diverticulitis: w/ recent surgery. Recovering at adequate pace. Follows with PCP closely     Medication Adjustments/Labs and Tests Ordered: Current medicines are reviewed at length with the patient today.  Concerns regarding medicines are outlined above.  No orders of the  defined types were placed in this encounter.  No orders of the defined types were placed in this encounter.   Patient Instructions  Medication Instructions:  Your physician has recommended you make the following change in your medication:  STOP PLAVIX  *If you need a refill on your cardiac medications before your next appointment, please call your pharmacy*   Lab Work: NONE If you have labs (blood work) drawn today and your tests are completely normal, you will receive your results only by: [place] (if you have MyChart) OR A paper copy in the mail If you have any lab test that is abnormal or we need to change your treatment, we will call you to review the results.   Testing/Procedures: NONE   Follow-Up: At [facility], you and your health needs are our priority.  As part of our continuing mission to provide you with exceptional heart care, we have created designated Provider Care Teams.  These Care Teams include your primary Cardiologist (physician) and Advanced Practice Providers (APPs -  Physician Assistants and Nurse Practitioners) who all work together to provide you with the care you need, when you need it.  We recommend signing up for the patient portal called "MyChart".  Sign up information is provided on this After Visit Summary.  MyChart is used to connect with patients for Virtual Visits (Telemedicine).  Patients are able to view lab/test results, encounter notes, upcoming appointments, etc.  Non-urgent messages can be sent to your provider as well.   To learn more about what you can do with MyChart, go to [email or web address].    Your next appointment:   October 2023  The format for your next appointment:   In Person  Provider:   DR. Quentin Ore OR Naw Lasala, NP   Signed, Kathyrn Drown, NP  04/23/2021 11:45 AM    [place]

## 2021-04-23 ENCOUNTER — Other Ambulatory Visit: Payer: Self-pay

## 2021-04-23 ENCOUNTER — Ambulatory Visit: Payer: Medicare HMO | Admitting: Cardiology

## 2021-04-23 VITALS — BP 132/74 | HR 58 | Ht 68.0 in | Wt 191.8 lb

## 2021-04-23 DIAGNOSIS — K5720 Diverticulitis of large intestine with perforation and abscess without bleeding: Secondary | ICD-10-CM

## 2021-04-23 DIAGNOSIS — I10 Essential (primary) hypertension: Secondary | ICD-10-CM

## 2021-04-23 DIAGNOSIS — I480 Paroxysmal atrial fibrillation: Secondary | ICD-10-CM

## 2021-04-23 DIAGNOSIS — Z95818 Presence of other cardiac implants and grafts: Secondary | ICD-10-CM | POA: Diagnosis not present

## 2021-04-23 NOTE — Patient Instructions (Addendum)
Medication Instructions:  Your physician has recommended you make the following change in your medication:  STOP PLAVIX  *If you need a refill on your cardiac medications before your next appointment, please call your pharmacy*   Lab Work: NONE If you have labs (blood work) drawn today and your tests are completely normal, you will receive your results only by: [place] (if you have MyChart) OR A paper copy in the mail If you have any lab test that is abnormal or we need to change your treatment, we will call you to review the results.   Testing/Procedures: NONE   Follow-Up: At [facility], you and your health needs are our priority.  As part of our continuing mission to provide you with exceptional heart care, we have created designated Provider Care Teams.  These Care Teams include your primary Cardiologist (physician) and Advanced Practice Providers (APPs -  Physician Assistants and Nurse Practitioners) who all work together to provide you with the care you need, when you need it.  We recommend signing up for the patient portal called "MyChart".  Sign up information is provided on this After Visit Summary.  MyChart is used to connect with patients for Virtual Visits (Telemedicine).  Patients are able to view lab/test results, encounter notes, upcoming appointments, etc.  Non-urgent messages can be sent to your provider as well.   To learn more about what you can do with MyChart, go to [email or web address].    Your next appointment:   October 2023  The format for your next appointment:   In Person  Provider:   DR. Quentin Ore OR JILL MCDANIEL, NP

## 2021-05-23 DIAGNOSIS — J309 Allergic rhinitis, unspecified: Secondary | ICD-10-CM | POA: Diagnosis not present

## 2021-05-23 DIAGNOSIS — I700 Atherosclerosis of aorta: Secondary | ICD-10-CM | POA: Diagnosis not present

## 2021-05-23 DIAGNOSIS — Z800 Family history of malignant neoplasm of digestive organs: Secondary | ICD-10-CM | POA: Diagnosis not present

## 2021-05-23 DIAGNOSIS — Z008 Encounter for other general examination: Secondary | ICD-10-CM | POA: Diagnosis not present

## 2021-05-23 DIAGNOSIS — I10 Essential (primary) hypertension: Secondary | ICD-10-CM | POA: Diagnosis not present

## 2021-05-23 DIAGNOSIS — D6869 Other thrombophilia: Secondary | ICD-10-CM | POA: Diagnosis not present

## 2021-05-23 DIAGNOSIS — Z7982 Long term (current) use of aspirin: Secondary | ICD-10-CM | POA: Diagnosis not present

## 2021-05-23 DIAGNOSIS — H547 Unspecified visual loss: Secondary | ICD-10-CM | POA: Diagnosis not present

## 2021-05-23 DIAGNOSIS — G4733 Obstructive sleep apnea (adult) (pediatric): Secondary | ICD-10-CM | POA: Diagnosis not present

## 2021-05-23 DIAGNOSIS — K219 Gastro-esophageal reflux disease without esophagitis: Secondary | ICD-10-CM | POA: Diagnosis not present

## 2021-05-23 DIAGNOSIS — E785 Hyperlipidemia, unspecified: Secondary | ICD-10-CM | POA: Diagnosis not present

## 2021-05-23 DIAGNOSIS — I4891 Unspecified atrial fibrillation: Secondary | ICD-10-CM | POA: Diagnosis not present

## 2021-05-23 DIAGNOSIS — H409 Unspecified glaucoma: Secondary | ICD-10-CM | POA: Diagnosis not present

## 2021-05-24 DIAGNOSIS — H6092 Unspecified otitis externa, left ear: Secondary | ICD-10-CM | POA: Diagnosis not present

## 2021-05-24 DIAGNOSIS — Z974 Presence of external hearing-aid: Secondary | ICD-10-CM | POA: Diagnosis not present

## 2021-05-24 DIAGNOSIS — H938X3 Other specified disorders of ear, bilateral: Secondary | ICD-10-CM | POA: Diagnosis not present

## 2021-06-06 ENCOUNTER — Ambulatory Visit: Payer: Medicare HMO | Admitting: Physician Assistant

## 2021-06-06 DIAGNOSIS — D485 Neoplasm of uncertain behavior of skin: Secondary | ICD-10-CM

## 2021-06-06 DIAGNOSIS — C44622 Squamous cell carcinoma of skin of right upper limb, including shoulder: Secondary | ICD-10-CM

## 2021-06-06 DIAGNOSIS — Z85828 Personal history of other malignant neoplasm of skin: Secondary | ICD-10-CM | POA: Diagnosis not present

## 2021-06-06 DIAGNOSIS — L570 Actinic keratosis: Secondary | ICD-10-CM

## 2021-06-06 DIAGNOSIS — Z1283 Encounter for screening for malignant neoplasm of skin: Secondary | ICD-10-CM | POA: Diagnosis not present

## 2021-06-06 NOTE — Patient Instructions (Signed)

## 2021-06-07 DIAGNOSIS — H5213 Myopia, bilateral: Secondary | ICD-10-CM | POA: Diagnosis not present

## 2021-06-07 DIAGNOSIS — H2513 Age-related nuclear cataract, bilateral: Secondary | ICD-10-CM | POA: Diagnosis not present

## 2021-06-07 DIAGNOSIS — H401122 Primary open-angle glaucoma, left eye, moderate stage: Secondary | ICD-10-CM | POA: Diagnosis not present

## 2021-06-07 DIAGNOSIS — H52223 Regular astigmatism, bilateral: Secondary | ICD-10-CM | POA: Diagnosis not present

## 2021-06-07 DIAGNOSIS — H401112 Primary open-angle glaucoma, right eye, moderate stage: Secondary | ICD-10-CM | POA: Diagnosis not present

## 2021-06-11 ENCOUNTER — Encounter: Payer: Self-pay | Admitting: Physician Assistant

## 2021-06-11 NOTE — Progress Notes (Signed)
? ?  Follow-Up Visit ?  ?Subjective  ?Zachary Montoya is a 76 y.o. male who presents for the following: Follow-up (6 month follow up. Wants his scalp checked well and has a new lesion on right elbow x weeks History of non mole skin cancers.). ? ? ?The following portions of the chart were reviewed this encounter and updated as appropriate:  Tobacco  Allergies  Meds  Problems  Med Hx  Surg Hx  Fam Hx   ?  ? ?Objective  ?Well appearing patient in no apparent distress; mood and affect are within normal limits. ? ?All skin waist up examined. ? ?No atypical nevi No signs of non-mole skin cancer.  ? ?Mid Parietal Scalp (3), Right Temple (2) ?Erythematous patches with gritty scale. ? ?Right Forearm - Posterior ?Volcano growth on pink base  ? ? ? ? ? ? ? ?Assessment & Plan  ?Encounter for screening for malignant neoplasm of skin ? ?Yearly skin exam ? ?AK (actinic keratosis) (5) ?Mid Parietal Scalp (3); Right Temple (2) ? ?Destruction of lesion - Mid Parietal Scalp (2), Right Temple ?Complexity: simple   ?Destruction method: cryotherapy   ?Informed consent: discussed and consent obtained   ?Timeout:  patient name, date of birth, surgical site, and procedure verified ?Lesion destroyed using liquid nitrogen: Yes   ?Cryotherapy cycles:  1 ?Outcome: patient tolerated procedure well with no complications   ?Post-procedure details: wound care instructions given   ? ?SCC (squamous cell carcinoma), arm, right ?Right Forearm - Posterior ? ?Skin / nail biopsy ?Type of biopsy: tangential   ?Informed consent: discussed and consent obtained   ?Timeout: patient name, date of birth, surgical site, and procedure verified   ?Procedure prep:  Patient was prepped and draped in usual sterile fashion (Non sterile) ?Prep type:  Chlorhexidine ?Anesthesia: the lesion was anesthetized in a standard fashion   ?Anesthetic:  1% lidocaine w/ epinephrine 1-100,000 local infiltration ?Instrument used: flexible razor blade   ?Outcome: patient tolerated  procedure well   ?Post-procedure details: wound care instructions given   ? ?Destruction of lesion ?Complexity: simple   ?Destruction method: electrodesiccation and curettage   ?Informed consent: discussed and consent obtained   ?Timeout:  patient name, date of birth, surgical site, and procedure verified ?Anesthesia: the lesion was anesthetized in a standard fashion   ?Anesthetic:  1% lidocaine w/ epinephrine 1-100,000 local infiltration ?Curettage performed in three different directions: Yes   ?Electrodesiccation performed over the curetted area: Yes   ?Curettage cycles:  3 ?Margin per side (cm):  0.1 ?Final wound size (cm):  1.3 ?Hemostasis achieved with:  aluminum chloride ?Outcome: patient tolerated procedure well with no complications   ?Post-procedure details: wound care instructions given   ? ?Specimen 1 - Surgical pathology ?Differential Diagnosis: bcc vs scc- txpbx ? ?Check Margins: No ? ? ? ?I, Jasiel Belisle, PA-C, have reviewed all documentation's for this visit.  The documentation on 06/12/21 for the exam, diagnosis, procedures and orders are all accurate and complete. ?

## 2021-06-12 ENCOUNTER — Telehealth: Payer: Self-pay | Admitting: *Deleted

## 2021-06-12 NOTE — Telephone Encounter (Signed)
Path to patient. Patient has a 6 month follow up.  ?

## 2021-06-12 NOTE — Telephone Encounter (Signed)
-----   Message from Warren Danes, [place] sent at 06/12/2021  1:35 PM EDT ----- ?Tx with biopsy. Recheck 6 months ?

## 2021-07-18 DIAGNOSIS — H401113 Primary open-angle glaucoma, right eye, severe stage: Secondary | ICD-10-CM | POA: Diagnosis not present

## 2021-07-18 DIAGNOSIS — H401123 Primary open-angle glaucoma, left eye, severe stage: Secondary | ICD-10-CM | POA: Diagnosis not present

## 2021-07-24 DIAGNOSIS — H60392 Other infective otitis externa, left ear: Secondary | ICD-10-CM | POA: Diagnosis not present

## 2021-07-31 ENCOUNTER — Other Ambulatory Visit: Payer: Self-pay

## 2021-07-31 MED ORDER — METOPROLOL SUCCINATE ER 25 MG PO TB24: 25.0000 mg | ORAL_TABLET | Freq: Every day | ORAL | 2 refills | Status: DC

## 2021-08-09 DIAGNOSIS — H903 Sensorineural hearing loss, bilateral: Secondary | ICD-10-CM | POA: Diagnosis not present

## 2021-08-15 ENCOUNTER — Other Ambulatory Visit: Payer: Self-pay

## 2021-08-15 MED ORDER — FLECAINIDE ACETATE 100 MG PO TABS: 100.0000 mg | ORAL_TABLET | Freq: Two times a day (BID) | ORAL | 2 refills | Status: DC

## 2021-09-13 DIAGNOSIS — H401123 Primary open-angle glaucoma, left eye, severe stage: Secondary | ICD-10-CM | POA: Diagnosis not present

## 2021-09-24 DIAGNOSIS — K219 Gastro-esophageal reflux disease without esophagitis: Secondary | ICD-10-CM | POA: Diagnosis not present

## 2021-09-24 DIAGNOSIS — H60332 Swimmer's ear, left ear: Secondary | ICD-10-CM | POA: Diagnosis not present

## 2021-09-24 DIAGNOSIS — E781 Pure hyperglyceridemia: Secondary | ICD-10-CM | POA: Diagnosis not present

## 2021-09-24 DIAGNOSIS — I480 Paroxysmal atrial fibrillation: Secondary | ICD-10-CM | POA: Diagnosis not present

## 2021-09-24 DIAGNOSIS — Z974 Presence of external hearing-aid: Secondary | ICD-10-CM | POA: Diagnosis not present

## 2021-10-01 DIAGNOSIS — H401123 Primary open-angle glaucoma, left eye, severe stage: Secondary | ICD-10-CM | POA: Diagnosis not present

## 2021-10-01 DIAGNOSIS — H401113 Primary open-angle glaucoma, right eye, severe stage: Secondary | ICD-10-CM | POA: Diagnosis not present

## 2021-10-04 DIAGNOSIS — H60392 Other infective otitis externa, left ear: Secondary | ICD-10-CM | POA: Diagnosis not present

## 2021-10-05 ENCOUNTER — Telehealth: Payer: Self-pay

## 2021-10-05 NOTE — Telephone Encounter (Signed)
Spoke with the patient. Scheduled him for 1 year LAAO visit on 10/31/2021. He was grateful for call and agrees with plan.

## 2021-10-19 DIAGNOSIS — R7301 Impaired fasting glucose: Secondary | ICD-10-CM | POA: Diagnosis not present

## 2021-10-19 DIAGNOSIS — I480 Paroxysmal atrial fibrillation: Secondary | ICD-10-CM | POA: Diagnosis not present

## 2021-10-19 DIAGNOSIS — D649 Anemia, unspecified: Secondary | ICD-10-CM | POA: Diagnosis not present

## 2021-10-19 DIAGNOSIS — R001 Bradycardia, unspecified: Secondary | ICD-10-CM | POA: Diagnosis not present

## 2021-10-19 DIAGNOSIS — R011 Cardiac murmur, unspecified: Secondary | ICD-10-CM | POA: Diagnosis not present

## 2021-10-19 DIAGNOSIS — G4733 Obstructive sleep apnea (adult) (pediatric): Secondary | ICD-10-CM | POA: Diagnosis not present

## 2021-10-30 NOTE — Progress Notes (Unsigned)
[facility]                                     Cardiology Office Note:    Date:  10/31/2021   ID:  Zachary Montoya, DOB 22-Apr-1945, MRN [id number]  PCP:  Crist Infante, MD  [facility] Cardiologist:  None  [facility] Electrophysiologist:  Will Meredith Leeds, MD   Referring MD: Crist Infante, MD   1 year s/p LAAO  History of Present Illness:    Zachary Montoya is a 76 y.o. male with a hx of obesity, OSA, HTN, PAF and poor candidacy for long term anticoagulation due to high risk occupation who underwent successful LAAO with a Watchman Flex on 10/26/20 who presents to clinic for follow up.  As above, he underwent successful LAAO with a 27 mm Watchman FLX by Dr. Quentin Ore on 10/26/20. Intraoperative TEE showed a well seated device with 22% compression and no peripheral leak. He was discharged on Eliquis '5mg'$  BID and ASA 81 mg. 45 day TEE 12/09/21 showed well seated device with no leaking and he was transitioned to DAPT x 6 months and then aspirin '81mg'$  daily alone thereafter.   Admitted in 03/2021 for a colectomy for diverticulitis and did quite well.   Today he presents to clinic for follow up. Here alone. No CP or SOB. No LE edema, orthopnea or PND. No dizziness or syncope. No blood in stool or urine. No palpitations. Remains active with no limitations.    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   Right Scaphoid Fossa   PAF (paroxysmal atrial fibrillation) (HCC)    SCCA (squamous cell carcinoma) of skin 07/26/2020   Left Posterior Neck   Sleep apnea     Past Surgical History:  Procedure Laterality Date   APPENDECTOMY     COLONOSCOPY WITH PROPOFOL N/A 03/27/2021    Procedure: COLONOSCOPY WITH PROPOFOL;  Surgeon: Clarene Essex, MD;  Location: [facility];  Service: Endoscopy;  Laterality: N/A;   ESOPHAGOGASTRIC FUNDOPLICATION     LEFT ATRIAL APPENDAGE OCCLUSION N/A 10/26/2020   27 mm Watchman left atrial appendage occlusion device   POLYPECTOMY  03/27/2021   Procedure: POLYPECTOMY;  Surgeon: Clarene Essex, MD;  Location: [facility];  Service: Endoscopy;;   PROCTOSCOPY N/A 03/28/2021   Procedure: RIGID PROCTOSCOPY;  Surgeon: Michael Boston, MD;  Location: [facility];  Service: General;  Laterality: N/A;   TEE WITHOUT CARDIOVERSION N/A 10/26/2020   Procedure: TRANSESOPHAGEAL ECHOCARDIOGRAM (TEE);  Surgeon: Vickie Epley, MD;  Location: [facility];  Service: Cardiovascular;  Laterality: N/A;   TEE WITHOUT CARDIOVERSION N/A 12/07/2020   Procedure: TRANSESOPHAGEAL ECHOCARDIOGRAM (TEE);  Surgeon: Elouise Munroe, MD;  Location: [employer];  Service: Cardiology;  Laterality: N/A;   UMBILICAL HERNIA REPAIR N/A 03/28/2021   Procedure: PRIMARY REPAIR INCERCERATED UMBILICAL HERNIA 2CM;  Surgeon: Michael Boston, MD;  Location: [facility];  Service: General;  Laterality: N/A;    Current Medications: Current Meds  Medication Sig   acetaminophen (TYLENOL) 650 MG CR tablet Take 650-1,300 mg by mouth every 8 (eight) hours as needed for pain.   aspirin 81 MG chewable tablet 1  tablet   atorvastatin (LIPITOR) 20 MG tablet Take 1 tablet by mouth daily.   B Complex-C (B-COMPLEX WITH VITAMIN C) tablet Take 1 tablet by mouth daily.   cetirizine (ZYRTEC) 10 MG tablet Take 10 mg by mouth daily.   cholecalciferol (VITAMIN D) 25 MCG (1000 UNIT) tablet Take 1,000 Units by mouth daily.   COVID-19 mRNA bivalent vaccine, Pfizer, (PFIZER COVID-19 VAC BIVALENT) injection Inject into the muscle.   ferrous sulfate 325 (65 FE) MG tablet Take 325 mg by mouth daily.   flecainide (TAMBOCOR) 100 MG tablet Take 1 tablet (100 mg total) by mouth 2 (two) times daily.   influenza vaccine  adjuvanted (FLUAD QUADRIVALENT) 0.5 ML injection Inject into the muscle.   metoprolol succinate (TOPROL-XL) 25 MG 24 hr tablet Take 1 tablet (25 mg total) by mouth daily.   Multiple Vitamins-Minerals (CENTRUM ADULTS PO) Take 1 tablet by mouth daily.   Omega 3 1000 MG CAPS Take 1,000 mg by mouth daily.   polyethylene glycol (MIRALAX / GLYCOLAX) 17 g packet Take 17 g by mouth daily.   triamcinolone (NASACORT) 55 MCG/ACT AERO nasal inhaler Place 1 spray into the nose daily as needed (allergies).     Allergies:   Bee venom, Timolol, Brimonidine tartrate, and Codeine   Social History   Socioeconomic History   Marital status: Married    Spouse name: Not on file   Number of children: Not on file   Years of education: Not on file   Highest education level: Not on file  Occupational History   Not on file  Tobacco Use   Smoking status: Never   Smokeless tobacco: Never  Vaping Use   Vaping Use: Never used  Substance and Sexual Activity   Alcohol use: Yes    Alcohol/week: 1.0 standard drink of alcohol    Types: 1 Cans of beer per week    Comment: occas.   Drug use: No   Sexual activity: Not on file  Other Topics Concern   Not on file  Social History Narrative   Not on file   Social Determinants of Health   Financial Resource Strain: Not on file  Food Insecurity: Not on file  Transportation Needs: Not on file  Physical Activity: Not on file  Stress: Not on file  Social Connections: Not on file     Family History: The patient's family history includes Cancer in his brother; Diabetes in his mother; Hypertension in his mother; Stroke in his mother. There is no history of Heart attack.  ROS:   Please see the history of present illness.    All other systems reviewed and are negative.  EKGs/Labs/Other Studies Reviewed:    The following studies were reviewed today:  TEE 10/26/20 Transeptal Puncture Intra-procedural TEE which showed no LAA thrombus Left atrial appendage  occlusive device placement on 10/26/20 by Dr. Quentin Ore.  This study demonstrated: IMPRESSIONS   1. Left ventricular ejection fraction, by estimation, is 60 to 65%. The  left ventricle has normal function. The left ventricle has no regional  wall motion abnormalities.   2. Right ventricular systolic function is normal. The right ventricular  size is normal.   3. Left atrial appendage windsock morphology with average ostial diameter  of 21 mm. A 27 mm Watchman FLX device is implanted, is stable and well  seated with 22% compression and no peripheral leak. 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  Left atrial size was mildly dilated. No left atrial/left atrial appendage  thrombus was detected.   4. The mitral valve is normal in structure. Trivial mitral valve  regurgitation. No evidence of mitral stenosis.   5. The aortic valve is tricuspid. Aortic valve regurgitation is trivial.  No aortic stenosis is present.   6. The inferior vena cava is normal in size with greater than 50%  respiratory variability, suggesting right atrial pressure of 3 mmHg.   Conclusion(s)/Recommendation(s): TEE was used for pre-procedure  measurements and planning, guidance of transseptal puncture, device  deployment, post procedure assessment , including 3D reconstruction of  images.   ______________________  LAAO 10/26/20 CONCLUSIONS:  1.Successful implantation of a WATCHMAN left atrial appendage occlusive device    2. TEE demonstrating no LAA thrombus 3. No early apparent complications.  4. Plan for Apixaban '5mg'$  PO BID and Aspirin '81mg'$  PO daily until the 45 day follow up. If no leak or thrombus at that TEE will transition to Aspirin '81mg'$  PO daily and Plavix '75mg'$  PO daily.   __________________________  TEE 12/09/21 IMPRESSIONS   1. 27 mm Watchman FLX left atrial appendage occluder well seated, no  residual flow. Left  atrial size was mildly dilated. No left atrial/left  atrial appendage thrombus was detected.   2. Left ventricular ejection fraction, by estimation, is 60 to 65%. The  left ventricle has normal function.   3. Right ventricular systolic function is normal. The right ventricular  size is normal.   4. The mitral valve is grossly normal. Mild mitral valve regurgitation.   5. The aortic valve is normal in structure. Aortic valve regurgitation is  trivial.    EKG:  EKG is ordered today. ECG shows sinus brady with 1st deg av block, HR 50 bpm  Recent Labs: 12/22/2020: ALT 24 03/29/2021: BUN 13; Creatinine, Ser 1.02; Hemoglobin 12.5; Magnesium 1.7; Platelets 179; Potassium 4.3; Sodium 134  Recent Lipid Panel No results found for: "CHOL", "TRIG", "HDL", "CHOLHDL", "VLDL", "LDLCALC", "LDLDIRECT"   Risk Assessment/Calculations:    CHA2DS2-VASc Score =     This indicates a  % annual risk of stroke. The patient's score is based upon:        Physical Exam:    VS:  BP 120/84   Pulse (!) 50   Ht '5\' 8"'$  (1.727 m)   Wt 203 lb (92.1 kg)   SpO2 94%   BMI 30.87 kg/m     Wt Readings from Last 3 Encounters:  10/31/21 203 lb (92.1 kg)  04/23/21 191 lb 12.8 oz (87 kg)  03/30/21 196 lb 13.9 oz (89.3 kg)     GEN:  Well nourished, well developed in no acute distress HEENT: Normal NECK: No JVD; No carotid bruits LYMPHATICS: No lymphadenopathy CARDIAC: RRR, no murmurs, rubs, gallops RESPIRATORY:  Clear to auscultation without rales, wheezing or rhonchi  ABDOMEN: Soft, non-tender, non-distended MUSCULOSKELETAL:  No edema; No deformity  SKIN: Warm and dry NEUROLOGIC:  Alert and oriented x 3 PSYCHIATRIC:  Normal affect   ASSESSMENT:    1. Presence of Watchman left atrial appendage closure device   2. Paroxysmal atrial fibrillation (HCC)   3. Essential hypertension   4. OSA (obstructive sleep apnea)     PLAN:    In order of problems listed above:  PAF s/p LAAO with Watchman: doing  great on a baby aspirin. No recurrent afib on flecainide and toprol XL. Will see him back with Dr. Curt Bears in 1 year or sooner  if needed.   HTN: BP well controlled. No changes made today.   OSA: continue CPAP    Medication Adjustments/Labs and Tests Ordered: Current medicines are reviewed at length with the patient today.  Concerns regarding medicines are outlined above.  No orders of the defined types were placed in this encounter.   No orders of the defined types were placed in this encounter.    There are no Patient Instructions on file for this visit.   Signed, Angelena Form, PA-C  10/31/2021 1:55 PM    [facility]

## 2021-10-31 ENCOUNTER — Ambulatory Visit: Payer: Medicare HMO | Attending: Cardiology | Admitting: Physician Assistant

## 2021-10-31 ENCOUNTER — Ambulatory Visit: Payer: Medicare HMO | Admitting: Physician Assistant

## 2021-10-31 VITALS — BP 120/84 | HR 50 | Ht 68.0 in | Wt 203.0 lb

## 2021-10-31 DIAGNOSIS — Z95818 Presence of other cardiac implants and grafts: Secondary | ICD-10-CM

## 2021-10-31 DIAGNOSIS — I10 Essential (primary) hypertension: Secondary | ICD-10-CM | POA: Diagnosis not present

## 2021-10-31 DIAGNOSIS — G4733 Obstructive sleep apnea (adult) (pediatric): Secondary | ICD-10-CM | POA: Diagnosis not present

## 2021-10-31 DIAGNOSIS — I480 Paroxysmal atrial fibrillation: Secondary | ICD-10-CM | POA: Diagnosis not present

## 2021-10-31 NOTE — Patient Instructions (Signed)
Medication Instructions:  Your physician recommends that you continue on your current medications as directed. Please refer to the Current Medication list given to you today.  *If you need a refill on your cardiac medications before your next appointment, please call your pharmacy*   Lab Work: NONE If you have labs (blood work) drawn today and your tests are completely normal, you will receive your results only by: [place] (if you have MyChart) OR A paper copy in the mail If you have any lab test that is abnormal or we need to change your treatment, we will call you to review the results.   Testing/Procedures: NONE   Follow-Up: At [facility], you and your health needs are our priority.  As part of our continuing mission to provide you with exceptional heart care, we have created designated Provider Care Teams.  These Care Teams include your primary Cardiologist (physician) and Advanced Practice Providers (APPs -  Physician Assistants and Nurse Practitioners) who all work together to provide you with the care you need, when you need it.  We recommend signing up for the patient portal called "MyChart".  Sign up information is provided on this After Visit Summary.  MyChart is used to connect with patients for Virtual Visits (Telemedicine).  Patients are able to view lab/test results, encounter notes, upcoming appointments, etc.  Non-urgent messages can be sent to your provider as well.   To learn more about what you can do with MyChart, go to [email or web address].    Your next appointment:   1 year(s)  The format for your next appointment:   In Person  Provider:   You may see Will Meredith Leeds, MD or one of the following Advanced Practice Providers on your designated Care Team:   Tommye Standard, [place] Legrand Como "Jonni Sanger" Chalmers Cater, [place]   Important Information About Sugar

## 2021-11-13 DIAGNOSIS — H401123 Primary open-angle glaucoma, left eye, severe stage: Secondary | ICD-10-CM | POA: Diagnosis not present

## 2021-11-13 DIAGNOSIS — H401113 Primary open-angle glaucoma, right eye, severe stage: Secondary | ICD-10-CM | POA: Diagnosis not present

## 2021-11-14 ENCOUNTER — Ambulatory Visit: Payer: Medicare HMO | Admitting: Physician Assistant

## 2021-11-19 ENCOUNTER — Other Ambulatory Visit (HOSPITAL_BASED_OUTPATIENT_CLINIC_OR_DEPARTMENT_OTHER): Payer: Self-pay

## 2021-11-19 MED ORDER — INFLUENZA VAC A&B SA ADJ QUAD 0.5 ML IM PRSY
PREFILLED_SYRINGE | INTRAMUSCULAR | 0 refills | Status: AC
  Filled 2021-11-19: qty 0.5, 1d supply, fill #0

## 2021-12-13 ENCOUNTER — Other Ambulatory Visit (HOSPITAL_BASED_OUTPATIENT_CLINIC_OR_DEPARTMENT_OTHER): Payer: Self-pay

## 2021-12-13 MED ORDER — COMIRNATY 30 MCG/0.3ML IM SUSY
PREFILLED_SYRINGE | INTRAMUSCULAR | 0 refills | Status: AC
  Filled 2021-12-13: qty 0.3, 1d supply, fill #0

## 2021-12-24 DIAGNOSIS — L821 Other seborrheic keratosis: Secondary | ICD-10-CM | POA: Diagnosis not present

## 2021-12-24 DIAGNOSIS — L814 Other melanin hyperpigmentation: Secondary | ICD-10-CM | POA: Diagnosis not present

## 2021-12-24 DIAGNOSIS — D229 Melanocytic nevi, unspecified: Secondary | ICD-10-CM | POA: Diagnosis not present

## 2021-12-24 DIAGNOSIS — L570 Actinic keratosis: Secondary | ICD-10-CM | POA: Diagnosis not present

## 2021-12-24 DIAGNOSIS — L578 Other skin changes due to chronic exposure to nonionizing radiation: Secondary | ICD-10-CM | POA: Diagnosis not present

## 2022-01-03 DIAGNOSIS — Z125 Encounter for screening for malignant neoplasm of prostate: Secondary | ICD-10-CM | POA: Diagnosis not present

## 2022-01-03 DIAGNOSIS — E781 Pure hyperglyceridemia: Secondary | ICD-10-CM | POA: Diagnosis not present

## 2022-01-03 DIAGNOSIS — R7989 Other specified abnormal findings of blood chemistry: Secondary | ICD-10-CM | POA: Diagnosis not present

## 2022-01-03 DIAGNOSIS — Z1212 Encounter for screening for malignant neoplasm of rectum: Secondary | ICD-10-CM | POA: Diagnosis not present

## 2022-01-03 DIAGNOSIS — D649 Anemia, unspecified: Secondary | ICD-10-CM | POA: Diagnosis not present

## 2022-01-03 DIAGNOSIS — R7301 Impaired fasting glucose: Secondary | ICD-10-CM | POA: Diagnosis not present

## 2022-01-04 DIAGNOSIS — Z8669 Personal history of other diseases of the nervous system and sense organs: Secondary | ICD-10-CM | POA: Diagnosis not present

## 2022-01-04 DIAGNOSIS — H938X3 Other specified disorders of ear, bilateral: Secondary | ICD-10-CM | POA: Diagnosis not present

## 2022-01-08 DIAGNOSIS — H9319 Tinnitus, unspecified ear: Secondary | ICD-10-CM | POA: Diagnosis not present

## 2022-01-08 DIAGNOSIS — Z1339 Encounter for screening examination for other mental health and behavioral disorders: Secondary | ICD-10-CM | POA: Diagnosis not present

## 2022-01-08 DIAGNOSIS — I480 Paroxysmal atrial fibrillation: Secondary | ICD-10-CM | POA: Diagnosis not present

## 2022-01-08 DIAGNOSIS — D649 Anemia, unspecified: Secondary | ICD-10-CM | POA: Diagnosis not present

## 2022-01-08 DIAGNOSIS — Z0000 Encounter for general adult medical examination without abnormal findings: Secondary | ICD-10-CM | POA: Diagnosis not present

## 2022-01-08 DIAGNOSIS — K5790 Diverticulosis of intestine, part unspecified, without perforation or abscess without bleeding: Secondary | ICD-10-CM | POA: Diagnosis not present

## 2022-01-08 DIAGNOSIS — R55 Syncope and collapse: Secondary | ICD-10-CM | POA: Diagnosis not present

## 2022-01-08 DIAGNOSIS — E1169 Type 2 diabetes mellitus with other specified complication: Secondary | ICD-10-CM | POA: Diagnosis not present

## 2022-01-08 DIAGNOSIS — R700 Elevated erythrocyte sedimentation rate: Secondary | ICD-10-CM | POA: Diagnosis not present

## 2022-01-08 DIAGNOSIS — Z23 Encounter for immunization: Secondary | ICD-10-CM | POA: Diagnosis not present

## 2022-01-08 DIAGNOSIS — E781 Pure hyperglyceridemia: Secondary | ICD-10-CM | POA: Diagnosis not present

## 2022-01-08 DIAGNOSIS — H547 Unspecified visual loss: Secondary | ICD-10-CM | POA: Diagnosis not present

## 2022-01-08 DIAGNOSIS — R011 Cardiac murmur, unspecified: Secondary | ICD-10-CM | POA: Diagnosis not present

## 2022-01-08 DIAGNOSIS — Z1331 Encounter for screening for depression: Secondary | ICD-10-CM | POA: Diagnosis not present

## 2022-01-10 DIAGNOSIS — G4733 Obstructive sleep apnea (adult) (pediatric): Secondary | ICD-10-CM | POA: Diagnosis not present

## 2022-01-22 ENCOUNTER — Other Ambulatory Visit (HOSPITAL_BASED_OUTPATIENT_CLINIC_OR_DEPARTMENT_OTHER): Payer: Self-pay

## 2022-01-22 MED ORDER — AREXVY 120 MCG/0.5ML IM SUSR
INTRAMUSCULAR | 0 refills | Status: AC
  Filled 2022-01-22: qty 0.5, 1d supply, fill #0

## 2022-02-14 DIAGNOSIS — H5213 Myopia, bilateral: Secondary | ICD-10-CM | POA: Diagnosis not present

## 2022-02-19 ENCOUNTER — Other Ambulatory Visit: Payer: Self-pay

## 2022-03-04 DIAGNOSIS — H401113 Primary open-angle glaucoma, right eye, severe stage: Secondary | ICD-10-CM | POA: Diagnosis not present

## 2022-03-04 DIAGNOSIS — H401123 Primary open-angle glaucoma, left eye, severe stage: Secondary | ICD-10-CM | POA: Diagnosis not present

## 2022-03-07 DIAGNOSIS — L570 Actinic keratosis: Secondary | ICD-10-CM | POA: Diagnosis not present

## 2022-03-07 DIAGNOSIS — D485 Neoplasm of uncertain behavior of skin: Secondary | ICD-10-CM | POA: Diagnosis not present

## 2022-03-07 DIAGNOSIS — C44722 Squamous cell carcinoma of skin of right lower limb, including hip: Secondary | ICD-10-CM | POA: Diagnosis not present

## 2022-03-19 DIAGNOSIS — C4492 Squamous cell carcinoma of skin, unspecified: Secondary | ICD-10-CM | POA: Diagnosis not present

## 2022-03-19 DIAGNOSIS — C44722 Squamous cell carcinoma of skin of right lower limb, including hip: Secondary | ICD-10-CM | POA: Diagnosis not present

## 2022-03-29 ENCOUNTER — Other Ambulatory Visit: Payer: Self-pay

## 2022-03-29 MED ORDER — METOPROLOL SUCCINATE ER 25 MG PO TB24: 25.0000 mg | ORAL_TABLET | Freq: Every day | ORAL | 2 refills | Status: DC

## 2022-03-30 DIAGNOSIS — Z85828 Personal history of other malignant neoplasm of skin: Secondary | ICD-10-CM | POA: Diagnosis not present

## 2022-03-30 DIAGNOSIS — Z8249 Family history of ischemic heart disease and other diseases of the circulatory system: Secondary | ICD-10-CM | POA: Diagnosis not present

## 2022-03-30 DIAGNOSIS — E669 Obesity, unspecified: Secondary | ICD-10-CM | POA: Diagnosis not present

## 2022-03-30 DIAGNOSIS — K219 Gastro-esophageal reflux disease without esophagitis: Secondary | ICD-10-CM | POA: Diagnosis not present

## 2022-03-30 DIAGNOSIS — I951 Orthostatic hypotension: Secondary | ICD-10-CM | POA: Diagnosis not present

## 2022-03-30 DIAGNOSIS — E785 Hyperlipidemia, unspecified: Secondary | ICD-10-CM | POA: Diagnosis not present

## 2022-03-30 DIAGNOSIS — D6869 Other thrombophilia: Secondary | ICD-10-CM | POA: Diagnosis not present

## 2022-03-30 DIAGNOSIS — I10 Essential (primary) hypertension: Secondary | ICD-10-CM | POA: Diagnosis not present

## 2022-03-30 DIAGNOSIS — I4891 Unspecified atrial fibrillation: Secondary | ICD-10-CM | POA: Diagnosis not present

## 2022-03-30 DIAGNOSIS — G4730 Sleep apnea, unspecified: Secondary | ICD-10-CM | POA: Diagnosis not present

## 2022-03-30 DIAGNOSIS — Z6830 Body mass index (BMI) 30.0-30.9, adult: Secondary | ICD-10-CM | POA: Diagnosis not present

## 2022-03-30 DIAGNOSIS — Z809 Family history of malignant neoplasm, unspecified: Secondary | ICD-10-CM | POA: Diagnosis not present

## 2022-04-18 DIAGNOSIS — D1801 Hemangioma of skin and subcutaneous tissue: Secondary | ICD-10-CM | POA: Diagnosis not present

## 2022-04-18 DIAGNOSIS — L821 Other seborrheic keratosis: Secondary | ICD-10-CM | POA: Diagnosis not present

## 2022-04-18 DIAGNOSIS — L570 Actinic keratosis: Secondary | ICD-10-CM | POA: Diagnosis not present

## 2022-04-18 DIAGNOSIS — Z5189 Encounter for other specified aftercare: Secondary | ICD-10-CM | POA: Diagnosis not present

## 2022-04-18 DIAGNOSIS — L814 Other melanin hyperpigmentation: Secondary | ICD-10-CM | POA: Diagnosis not present

## 2022-04-18 DIAGNOSIS — R238 Other skin changes: Secondary | ICD-10-CM | POA: Diagnosis not present

## 2022-04-18 DIAGNOSIS — L578 Other skin changes due to chronic exposure to nonionizing radiation: Secondary | ICD-10-CM | POA: Diagnosis not present

## 2022-04-18 DIAGNOSIS — D229 Melanocytic nevi, unspecified: Secondary | ICD-10-CM | POA: Diagnosis not present

## 2022-04-25 DIAGNOSIS — L578 Other skin changes due to chronic exposure to nonionizing radiation: Secondary | ICD-10-CM | POA: Diagnosis not present

## 2022-04-25 DIAGNOSIS — L821 Other seborrheic keratosis: Secondary | ICD-10-CM | POA: Diagnosis not present

## 2022-04-25 DIAGNOSIS — L570 Actinic keratosis: Secondary | ICD-10-CM | POA: Diagnosis not present

## 2022-04-25 DIAGNOSIS — D1801 Hemangioma of skin and subcutaneous tissue: Secondary | ICD-10-CM | POA: Diagnosis not present

## 2022-04-25 DIAGNOSIS — L814 Other melanin hyperpigmentation: Secondary | ICD-10-CM | POA: Diagnosis not present

## 2022-04-25 DIAGNOSIS — D229 Melanocytic nevi, unspecified: Secondary | ICD-10-CM | POA: Diagnosis not present

## 2022-05-16 DIAGNOSIS — Z5189 Encounter for other specified aftercare: Secondary | ICD-10-CM | POA: Diagnosis not present

## 2022-05-30 DIAGNOSIS — Z85828 Personal history of other malignant neoplasm of skin: Secondary | ICD-10-CM | POA: Diagnosis not present

## 2022-05-30 DIAGNOSIS — Z5189 Encounter for other specified aftercare: Secondary | ICD-10-CM | POA: Diagnosis not present

## 2022-06-19 DIAGNOSIS — R7301 Impaired fasting glucose: Secondary | ICD-10-CM | POA: Diagnosis not present

## 2022-06-19 DIAGNOSIS — Z7689 Persons encountering health services in other specified circumstances: Secondary | ICD-10-CM | POA: Diagnosis not present

## 2022-06-20 DIAGNOSIS — H401133 Primary open-angle glaucoma, bilateral, severe stage: Secondary | ICD-10-CM | POA: Diagnosis not present

## 2022-07-25 ENCOUNTER — Other Ambulatory Visit: Payer: Self-pay | Admitting: Cardiology

## 2022-08-19 DIAGNOSIS — D1801 Hemangioma of skin and subcutaneous tissue: Secondary | ICD-10-CM | POA: Diagnosis not present

## 2022-08-19 DIAGNOSIS — L821 Other seborrheic keratosis: Secondary | ICD-10-CM | POA: Diagnosis not present

## 2022-08-19 DIAGNOSIS — Z8589 Personal history of malignant neoplasm of other organs and systems: Secondary | ICD-10-CM | POA: Diagnosis not present

## 2022-08-19 DIAGNOSIS — L578 Other skin changes due to chronic exposure to nonionizing radiation: Secondary | ICD-10-CM | POA: Diagnosis not present

## 2022-08-19 DIAGNOSIS — D229 Melanocytic nevi, unspecified: Secondary | ICD-10-CM | POA: Diagnosis not present

## 2022-08-19 DIAGNOSIS — L814 Other melanin hyperpigmentation: Secondary | ICD-10-CM | POA: Diagnosis not present

## 2022-08-19 DIAGNOSIS — L570 Actinic keratosis: Secondary | ICD-10-CM | POA: Diagnosis not present

## 2022-08-20 DIAGNOSIS — H903 Sensorineural hearing loss, bilateral: Secondary | ICD-10-CM | POA: Diagnosis not present

## 2022-09-02 DIAGNOSIS — R011 Cardiac murmur, unspecified: Secondary | ICD-10-CM | POA: Diagnosis not present

## 2022-09-02 DIAGNOSIS — K5792 Diverticulitis of intestine, part unspecified, without perforation or abscess without bleeding: Secondary | ICD-10-CM | POA: Diagnosis not present

## 2022-09-02 DIAGNOSIS — H6123 Impacted cerumen, bilateral: Secondary | ICD-10-CM | POA: Diagnosis not present

## 2022-09-02 DIAGNOSIS — R350 Frequency of micturition: Secondary | ICD-10-CM | POA: Diagnosis not present

## 2022-09-02 DIAGNOSIS — G4733 Obstructive sleep apnea (adult) (pediatric): Secondary | ICD-10-CM | POA: Diagnosis not present

## 2022-09-02 DIAGNOSIS — R001 Bradycardia, unspecified: Secondary | ICD-10-CM | POA: Diagnosis not present

## 2022-09-02 DIAGNOSIS — K219 Gastro-esophageal reflux disease without esophagitis: Secondary | ICD-10-CM | POA: Diagnosis not present

## 2022-09-02 DIAGNOSIS — H547 Unspecified visual loss: Secondary | ICD-10-CM | POA: Diagnosis not present

## 2022-09-02 DIAGNOSIS — M792 Neuralgia and neuritis, unspecified: Secondary | ICD-10-CM | POA: Diagnosis not present

## 2022-09-02 DIAGNOSIS — I480 Paroxysmal atrial fibrillation: Secondary | ICD-10-CM | POA: Diagnosis not present

## 2022-09-02 DIAGNOSIS — E1169 Type 2 diabetes mellitus with other specified complication: Secondary | ICD-10-CM | POA: Diagnosis not present

## 2022-09-02 DIAGNOSIS — J309 Allergic rhinitis, unspecified: Secondary | ICD-10-CM | POA: Diagnosis not present

## 2022-09-18 DIAGNOSIS — H401133 Primary open-angle glaucoma, bilateral, severe stage: Secondary | ICD-10-CM | POA: Diagnosis not present

## 2022-09-19 DIAGNOSIS — D485 Neoplasm of uncertain behavior of skin: Secondary | ICD-10-CM | POA: Diagnosis not present

## 2022-09-19 DIAGNOSIS — C44329 Squamous cell carcinoma of skin of other parts of face: Secondary | ICD-10-CM | POA: Diagnosis not present

## 2022-10-01 ENCOUNTER — Other Ambulatory Visit: Payer: Self-pay | Admitting: Cardiology

## 2022-11-14 IMAGING — CR DG ABDOMEN 1V
1 series · 1 of 1 positions shown · non-contrast
Comparison: None.

CLINICAL DATA: Acute diverticulitis

EXAM:
ABDOMEN - 1 VIEW

[t abdomen supine]
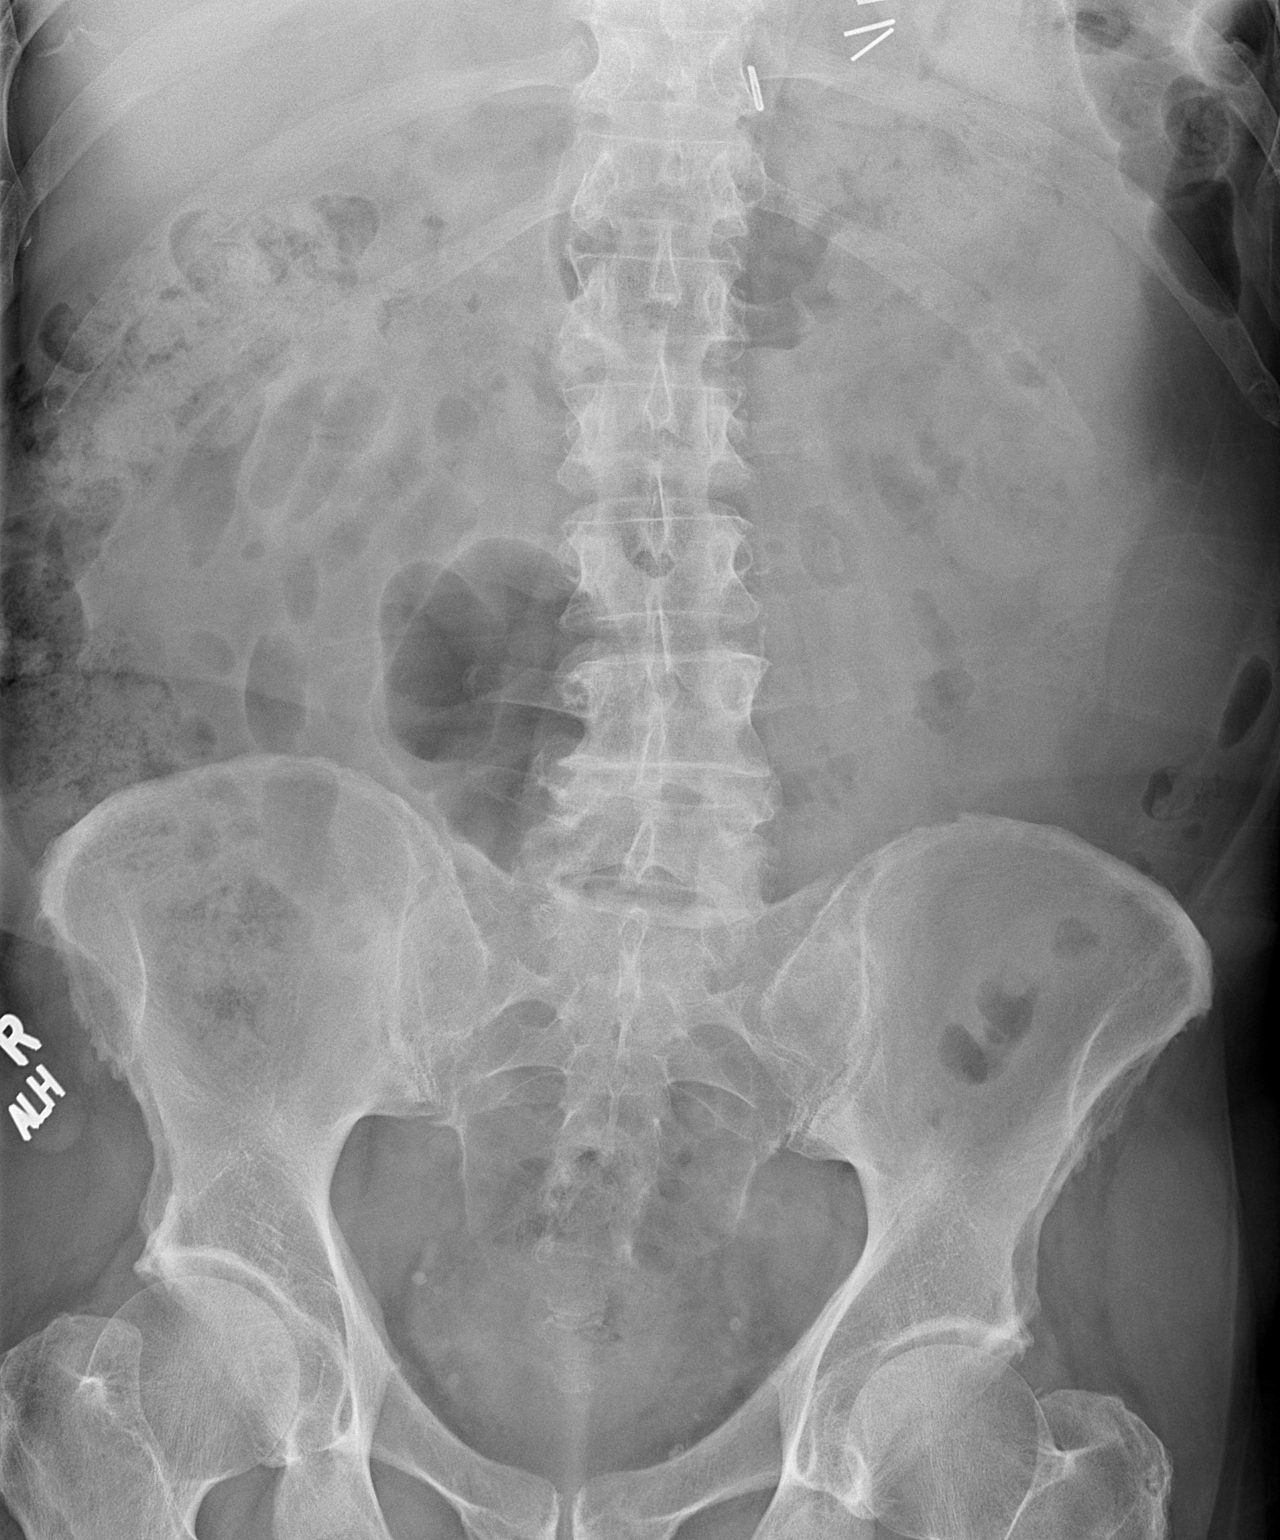

[1 of 1 positions shown; findings below may reference images not displayed]

FINDINGS: Normal abdominal gas pattern. No free intraperitoneal gas
identified. Surgical clips are seen within the epigastrium. Multiple
phleboliths noted within the pelvis. Osseous structures are
age-appropriate.
IMPRESSION: Negative.

## 2022-11-21 ENCOUNTER — Other Ambulatory Visit (HOSPITAL_BASED_OUTPATIENT_CLINIC_OR_DEPARTMENT_OTHER): Payer: Self-pay

## 2022-11-21 MED ORDER — COVID-19 MRNA VAC-TRIS(PFIZER) 30 MCG/0.3ML IM SUSY
0.3000 mL | PREFILLED_SYRINGE | Freq: Once | INTRAMUSCULAR | 0 refills | Status: AC
  Filled 2022-11-21: qty 0.3, 1d supply, fill #0

## 2022-11-21 MED ORDER — INFLUENZA VAC A&B SURF ANT ADJ 0.5 ML IM SUSY
0.5000 mL | PREFILLED_SYRINGE | Freq: Once | INTRAMUSCULAR | 0 refills | Status: AC
  Filled 2022-11-21: qty 0.5, 1d supply, fill #0

## 2022-11-25 DIAGNOSIS — H60332 Swimmer's ear, left ear: Secondary | ICD-10-CM | POA: Diagnosis not present

## 2022-11-25 DIAGNOSIS — H9113 Presbycusis, bilateral: Secondary | ICD-10-CM | POA: Diagnosis not present

## 2022-11-29 ENCOUNTER — Encounter: Payer: Self-pay | Admitting: Cardiology

## 2022-11-29 ENCOUNTER — Ambulatory Visit: Payer: Medicare HMO | Attending: Cardiology | Admitting: Cardiology

## 2022-11-29 VITALS — BP 116/80 | HR 58 | Ht 68.0 in | Wt 203.0 lb

## 2022-11-29 DIAGNOSIS — G4733 Obstructive sleep apnea (adult) (pediatric): Secondary | ICD-10-CM | POA: Diagnosis not present

## 2022-11-29 DIAGNOSIS — D6869 Other thrombophilia: Secondary | ICD-10-CM

## 2022-11-29 DIAGNOSIS — I480 Paroxysmal atrial fibrillation: Secondary | ICD-10-CM

## 2022-11-29 NOTE — Progress Notes (Signed)
  Electrophysiology Office Note:   Date:  11/29/2022  ID:  Zachary Montoya, DOB [birth year], MRN [id number]  Primary Cardiologist: None Electrophysiologist: Shantelle Alles Jorja Loa, MD      History of Present Illness:   Zachary Montoya is a 77 y.o. male with h/o sleep apnea, atrial fibrillation post Watchman, diabetes seen today for routine electrophysiology followup.   Since last being seen in our clinic the patient reports doing well.  He has had no chest pain or shortness of breath.  He has been able to do all of his daily activities without restriction.  He is noted no further episodes of atrial fibrillation.  he denies chest pain, palpitations, dyspnea, PND, orthopnea, nausea, vomiting, dizziness, syncope, edema, weight gain, or early satiety.   Review of systems complete and found to be negative unless listed in HPI.   EP Information / Studies Reviewed:    EKG is ordered today. Personal review as below.  EKG Interpretation Date/Time:  Friday November 29 2022 11:46:15 EDT Ventricular Rate:  58 PR Interval:  208 QRS Duration:  110 QT Interval:  460 QTC Calculation: 451 R Axis:   -54  Text Interpretation: Sinus bradycardia Left anterior fascicular block Minimal voltage criteria for LVH, may be normal variant ( Cornell product ) When compared with ECG of 27-Oct-2020 06:47, No significant change was found Confirmed by Delvon Chipps ([id number]) on 11/29/2022 11:54:28 AM     Risk Assessment/Calculations:    CHA2DS2-VASc Score = 2   This indicates a 2.2% annual risk of stroke. The patient's score is based upon: CHF History: 0 HTN History: 0 Diabetes History: 0 Stroke History: 0 Vascular Disease History: 0 Age Score: 2 Gender Score: 0             Physical Exam:   VS:  BP 116/80 (BP Location: Left Arm, Patient Position: Sitting, Cuff Size: Large)   Pulse (!) 58   Ht 5\' 8"  (1.727 m)   Wt 203 lb (92.1 kg)   SpO2 97%   BMI 30.87 kg/m    Wt Readings from Last 3 Encounters:   11/29/22 203 lb (92.1 kg)  10/31/21 203 lb (92.1 kg)  04/23/21 191 lb 12.8 oz (87 kg)     GEN: Well nourished, well developed in no acute distress NECK: No JVD; No carotid bruits CARDIAC: Regular rate and rhythm, no murmurs, rubs, gallops RESPIRATORY:  Clear to auscultation without rales, wheezing or rhonchi  ABDOMEN: Soft, non-tender, non-distended EXTREMITIES:  No edema; No deformity   ASSESSMENT AND PLAN:    1.  Paroxysmal atrial fibrillation: Currently on diltiazem and flecainide.  He remains in sinus rhythm without further episodes of atrial fibrillation.  Happy with his control.  2.  Secondary hypercoagulable state: Post watchman for atrial fibrillation  3.  Obstructive sleep apnea: CPAP compliance encouraged  Follow up with EP APP in 12 months  Signed, Doyce Stonehouse Jorja Loa, MD

## 2022-12-03 DIAGNOSIS — Z8669 Personal history of other diseases of the nervous system and sense organs: Secondary | ICD-10-CM | POA: Diagnosis not present

## 2022-12-03 DIAGNOSIS — H903 Sensorineural hearing loss, bilateral: Secondary | ICD-10-CM | POA: Diagnosis not present

## 2022-12-19 ENCOUNTER — Other Ambulatory Visit: Payer: Self-pay

## 2022-12-19 MED ORDER — FLECAINIDE ACETATE 100 MG PO TABS: 100.0000 mg | ORAL_TABLET | Freq: Two times a day (BID) | ORAL | 3 refills | Status: DC

## 2023-01-14 DIAGNOSIS — L570 Actinic keratosis: Secondary | ICD-10-CM | POA: Diagnosis not present

## 2023-01-14 DIAGNOSIS — C44329 Squamous cell carcinoma of skin of other parts of face: Secondary | ICD-10-CM | POA: Diagnosis not present

## 2023-01-14 DIAGNOSIS — L821 Other seborrheic keratosis: Secondary | ICD-10-CM | POA: Diagnosis not present

## 2023-01-14 DIAGNOSIS — L814 Other melanin hyperpigmentation: Secondary | ICD-10-CM | POA: Diagnosis not present

## 2023-01-14 DIAGNOSIS — D229 Melanocytic nevi, unspecified: Secondary | ICD-10-CM | POA: Diagnosis not present

## 2023-01-14 DIAGNOSIS — L578 Other skin changes due to chronic exposure to nonionizing radiation: Secondary | ICD-10-CM | POA: Diagnosis not present

## 2023-01-14 DIAGNOSIS — Z8589 Personal history of malignant neoplasm of other organs and systems: Secondary | ICD-10-CM | POA: Diagnosis not present

## 2023-01-27 DIAGNOSIS — H401133 Primary open-angle glaucoma, bilateral, severe stage: Secondary | ICD-10-CM | POA: Diagnosis not present

## 2023-02-12 IMAGING — CT CT HEART MORPH/PULM VEIN W/ CM & W/O CA SCORE
1 series · 12 of 14 positions shown, 15 images · non-contrast
Comparison: 01/15/2006 chest radiograph 01/15/2006 chest CT.
COMPARISON: 01/15/2006 chest radiograph 01/15/2006 chest CT.

Addendum:
EXAM:
OVER-READ INTERPRETATION  CT CHEST

The following report is an over-read performed by radiologist Dr.
Demian Gaskin [[email or web address]] on 08/31/2020. This over-read
does not include interpretation of cardiac or coronary anatomy or
pathology. The coronary CTA interpretation by the cardiologist is
attached.
CLINICAL DATA: Atrial fibrillation scheduled for left atrial
appendage closure.
Cardiac CT/CTA
TECHNIQUE: A non-contrast, gated CT scan was obtained with axial slices of 3 mm
through the heart for calcium scoring. Calcium scoring was performed
using the Agatston method. A 110 kV prospective, gated, contrast
cardiac scan was obtained. Gantry rotation speed was 250 msecs and
collimation was 0.6 mm. Nitroglycerin was not given. A delayed scan
was obtained to exclude left atrial appendage thrombus. The 3D
dataset was reconstructed in 5% intervals of the 25-50% of the R-R
cycle. Late systolic phases were analyzed on a dedicated workstation
using MPR, MIP, and VRT modes. The patient received 100 cc of
contrast.

[Series 645: findings · 0.30mm/px · 12 of 14 slices shown, 15 images]
[im 2/14  vessel]
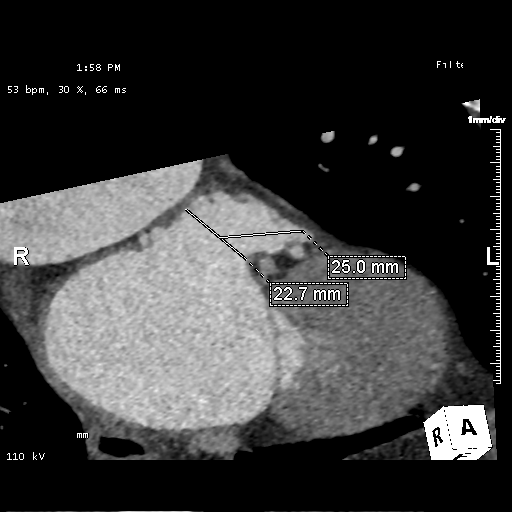
[im 2/14  lung]
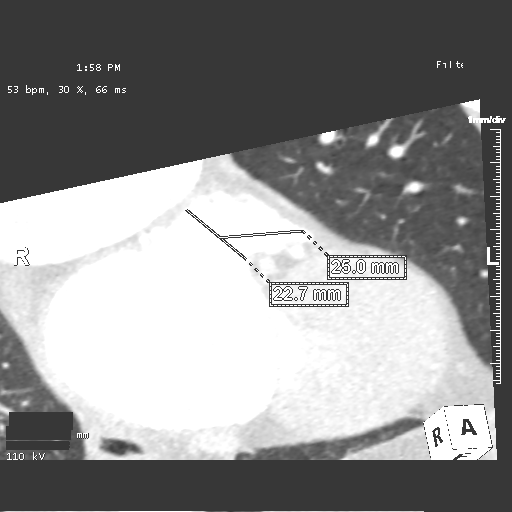
[im 3/14  vessel]
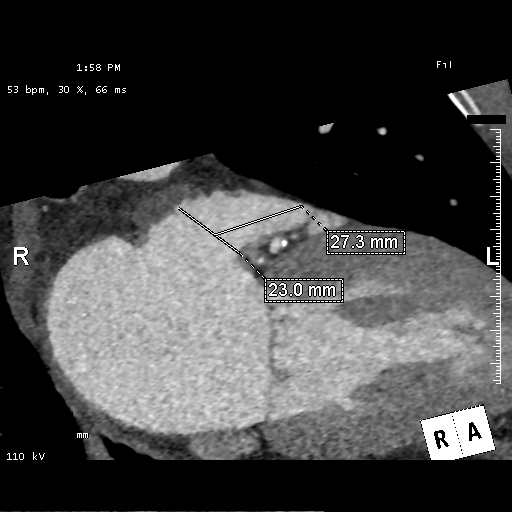
[im 4/14  vessel]
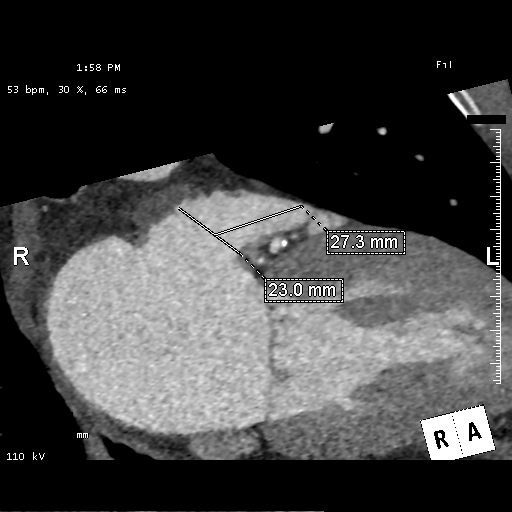
[im 5/14  vessel]
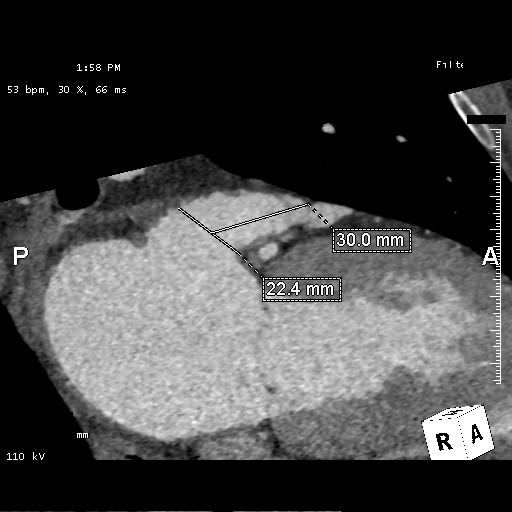
[im 6/14  vessel]
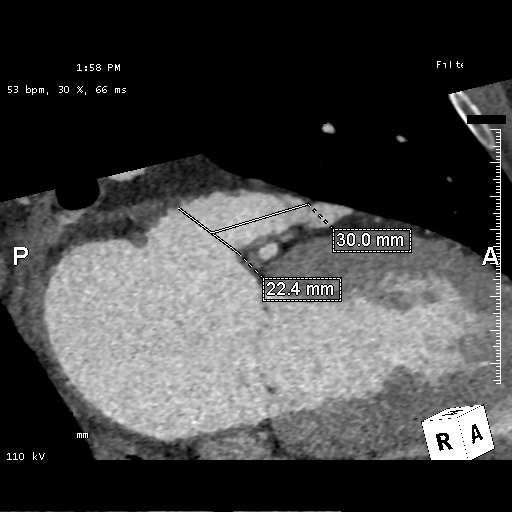
[im 6/14  lung]
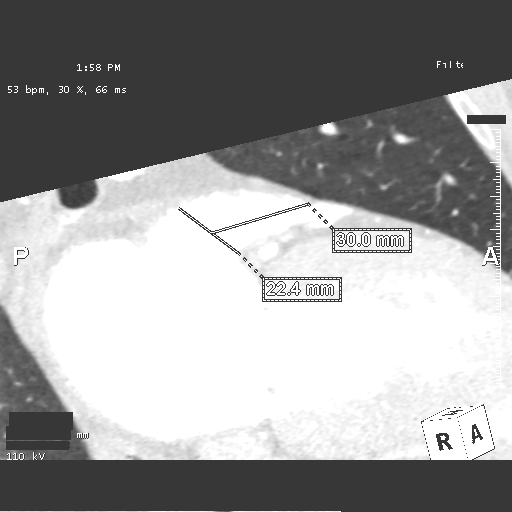
[im 7/14  vessel]
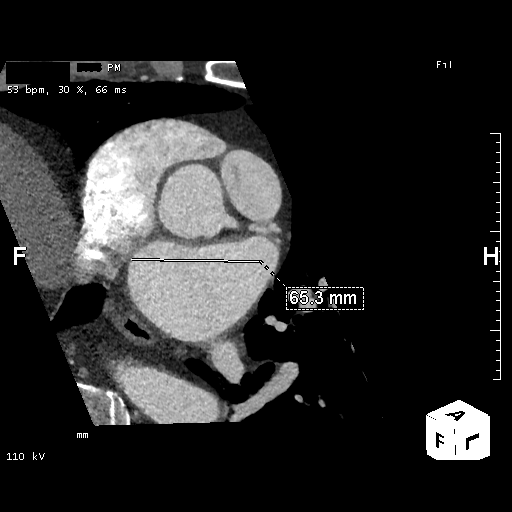
[im 8/14  vessel]
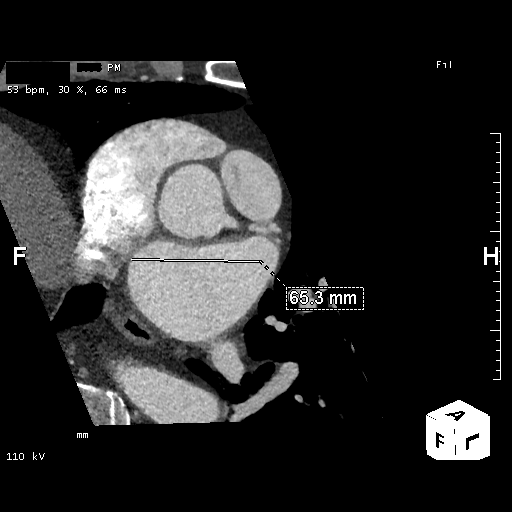
[im 9/14  vessel]
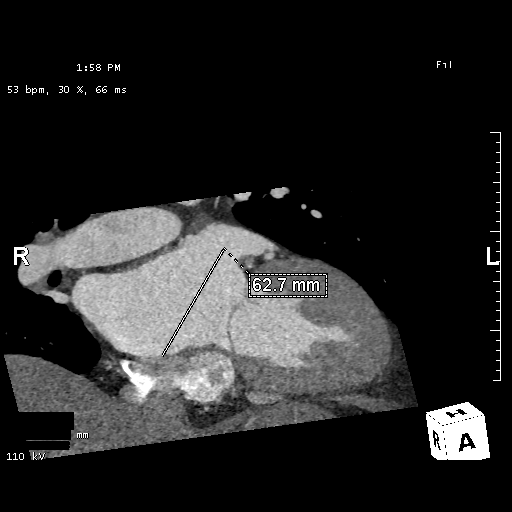
[im 10/14  vessel]
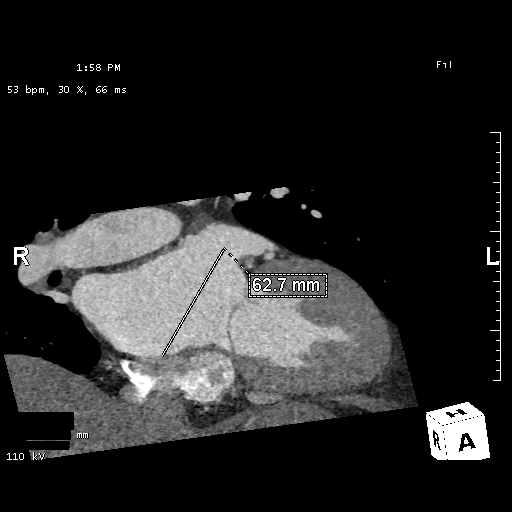
[im 10/14  lung]
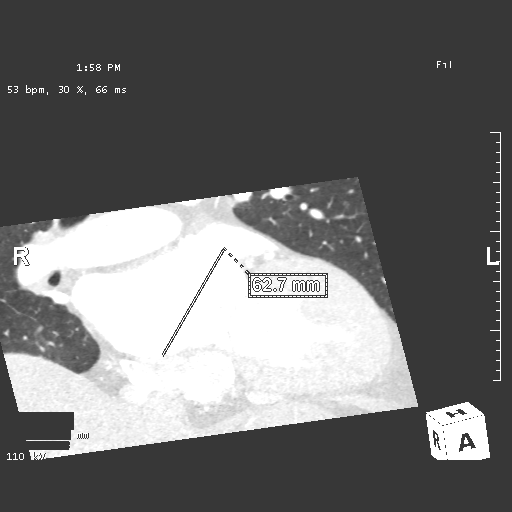
[im 11/14  vessel]
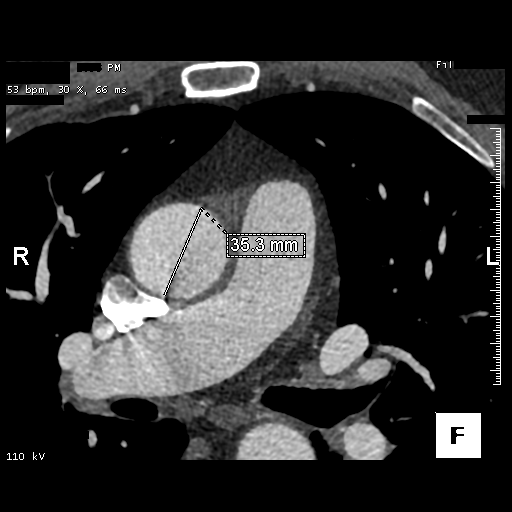
[im 12/14  vessel]
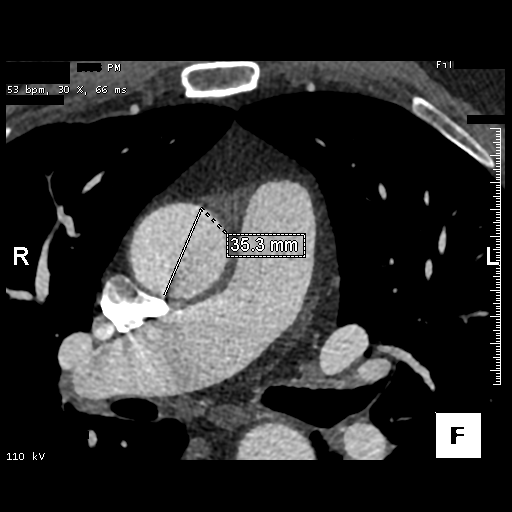
[im 13/14  vessel]
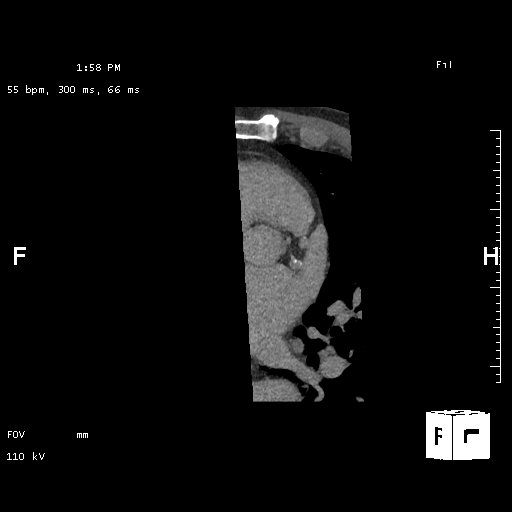

[12 of 14 positions shown; findings below may reference images not displayed]

FINDINGS: Vascular: Aortic atherosclerosis. No central pulmonary embolism, on
this non-dedicated study.

Mediastinum/Nodes: No imaged thoracic adenopathy.

Tiny hiatal hernia.  Subtle fluid level in the esophagus.

Lungs/Pleura: No pleural fluid.  Bibasilar scarring.

Upper Abdomen: Tiny perfusion anomaly in the hepatic dome. Normal
imaged portions of the spleen. Left upper quadrant surgical clips.

Musculoskeletal: No acute osseous abnormality.
IMPRESSION: 1.  No acute findings in the imaged extracardiac chest.
2.  Aortic Atherosclerosis (ZAC0G-071.1).
3. Esophageal air fluid level suggests dysmotility or
gastroesophageal reflux.
FINDINGS: Image quality: average.

Noise artifact is: Mild. Contrast bolus noted in the SVC. Suspect
the exam triggered early due to artifact from the SVC.

Left Atrium: The left atrial size is dilated. There is no PFO/ASD.
There is normal pulmonary vein drainage into the left atrium (2 on
the right and 2 on the left). There is a likely an accessory left
atrial appendage in the superior aspect of the LA.

Left Atrial Appendage:

Morphology: The left atrial appendage is large windsock morphology.
Thrombus: There is no thrombus in the left atrial appendage on
contrast or delayed imaging.

The following measurements were made regarding left atrial appendage
closure:

Phase assessed: 30%

Landing Zone measurement: 24.8 mm x 21.1 mm

DAW Length (maximum): 30 mm

Optimal interatrial septum puncture site: Mid and mid

Optimal deployment angle: RAO 20 SAEZ ORTIZ EDWIN 20

Catheter: A double curve catheter is recommended.

Abdoulkader Moussa [place] Device: A 31 mm device is recommended with 20%
compression.

Other comments: None.

Coronary Arteries: CAC score of 122, which is 39th percentile for
age-, sex-, and race-matched controls. Normal coronary origin. Right
dominance. The study was performed without use of NTG and is
insufficient for plaque evaluation.

Right Atrium: Right atrial size is dilated.

Right Ventricle: The right ventricular cavity is within normal
limits.

Left Ventricle: The ventricular cavity size is within normal limits.
There are no stigmata of prior infarction. There is no abnormal
filling defect.

Pulmonary Artery: Normal caliber without proximal filling defect.

Cardiac valves: The aortic valve is trileaflet without significant
calcification. The mitral valve is normal structure without
significant calcification.

Aorta: Normal caliber with no significant disease.

Pericardium: Normal thickness with no significant effusion or
calcium present.

Extra-cardiac findings: See attached radiology report for
non-cardiac structures.
IMPRESSION: 1. The left atrial appendage is a large windsock morphology without
thrombus.

2. A 31 mm Girme Ilma device is recommended based on the above
landing zone measurements (24.8 maximum diameter; 20% compression).

3. There is no thrombus in the left atrial appendage.

4. A mid and mid HACK puncture site is recommended.

5. Optimal deployment angle: RAO 20 SAEZ ORTIZ EDWIN 20

6. Normal coronary origin. Right dominance. CAC score of 122, which
is 39th percentile for age-, sex-, and race-matched controls.

*** End of Addendum ***
EXAM:
OVER-READ INTERPRETATION  CT CHEST

The following report is an over-read performed by radiologist Dr.
Demian Gaskin [[email or web address]] on 08/31/2020. This over-read
does not include interpretation of cardiac or coronary anatomy or
pathology. The coronary CTA interpretation by the cardiologist is
attached.
FINDINGS: Vascular: Aortic atherosclerosis. No central pulmonary embolism, on
this non-dedicated study.

Mediastinum/Nodes: No imaged thoracic adenopathy.

Tiny hiatal hernia.  Subtle fluid level in the esophagus.

Lungs/Pleura: No pleural fluid.  Bibasilar scarring.

Upper Abdomen: Tiny perfusion anomaly in the hepatic dome. Normal
imaged portions of the spleen. Left upper quadrant surgical clips.

Musculoskeletal: No acute osseous abnormality.
IMPRESSION: 1.  No acute findings in the imaged extracardiac chest.
2.  Aortic Atherosclerosis (ZAC0G-071.1).
3. Esophageal air fluid level suggests dysmotility or
gastroesophageal reflux.

## 2023-02-25 DIAGNOSIS — C44329 Squamous cell carcinoma of skin of other parts of face: Secondary | ICD-10-CM | POA: Diagnosis not present

## 2023-03-09 ENCOUNTER — Other Ambulatory Visit: Payer: Self-pay | Admitting: Cardiology

## 2023-03-10 DIAGNOSIS — E781 Pure hyperglyceridemia: Secondary | ICD-10-CM | POA: Diagnosis not present

## 2023-03-10 DIAGNOSIS — E1169 Type 2 diabetes mellitus with other specified complication: Secondary | ICD-10-CM | POA: Diagnosis not present

## 2023-03-10 DIAGNOSIS — R7989 Other specified abnormal findings of blood chemistry: Secondary | ICD-10-CM | POA: Diagnosis not present

## 2023-03-10 DIAGNOSIS — D649 Anemia, unspecified: Secondary | ICD-10-CM | POA: Diagnosis not present

## 2023-03-10 DIAGNOSIS — Z125 Encounter for screening for malignant neoplasm of prostate: Secondary | ICD-10-CM | POA: Diagnosis not present

## 2023-03-10 DIAGNOSIS — Z1212 Encounter for screening for malignant neoplasm of rectum: Secondary | ICD-10-CM | POA: Diagnosis not present

## 2023-03-17 DIAGNOSIS — I480 Paroxysmal atrial fibrillation: Secondary | ICD-10-CM | POA: Diagnosis not present

## 2023-03-17 DIAGNOSIS — H9319 Tinnitus, unspecified ear: Secondary | ICD-10-CM | POA: Diagnosis not present

## 2023-03-17 DIAGNOSIS — E781 Pure hyperglyceridemia: Secondary | ICD-10-CM | POA: Diagnosis not present

## 2023-03-17 DIAGNOSIS — Z0000 Encounter for general adult medical examination without abnormal findings: Secondary | ICD-10-CM | POA: Diagnosis not present

## 2023-03-17 DIAGNOSIS — E1169 Type 2 diabetes mellitus with other specified complication: Secondary | ICD-10-CM | POA: Diagnosis not present

## 2023-03-17 DIAGNOSIS — K5790 Diverticulosis of intestine, part unspecified, without perforation or abscess without bleeding: Secondary | ICD-10-CM | POA: Diagnosis not present

## 2023-03-17 DIAGNOSIS — R972 Elevated prostate specific antigen [PSA]: Secondary | ICD-10-CM | POA: Diagnosis not present

## 2023-03-24 DIAGNOSIS — Z008 Encounter for other general examination: Secondary | ICD-10-CM | POA: Diagnosis not present

## 2023-04-28 DIAGNOSIS — R011 Cardiac murmur, unspecified: Secondary | ICD-10-CM | POA: Diagnosis not present

## 2023-04-28 DIAGNOSIS — R001 Bradycardia, unspecified: Secondary | ICD-10-CM | POA: Diagnosis not present

## 2023-04-28 DIAGNOSIS — I480 Paroxysmal atrial fibrillation: Secondary | ICD-10-CM | POA: Diagnosis not present

## 2023-04-28 DIAGNOSIS — R972 Elevated prostate specific antigen [PSA]: Secondary | ICD-10-CM | POA: Diagnosis not present

## 2023-04-28 DIAGNOSIS — R634 Abnormal weight loss: Secondary | ICD-10-CM | POA: Diagnosis not present

## 2023-05-07 DIAGNOSIS — H401132 Primary open-angle glaucoma, bilateral, moderate stage: Secondary | ICD-10-CM | POA: Diagnosis not present

## 2023-06-02 DIAGNOSIS — H401133 Primary open-angle glaucoma, bilateral, severe stage: Secondary | ICD-10-CM | POA: Diagnosis not present

## 2023-06-23 DIAGNOSIS — D229 Melanocytic nevi, unspecified: Secondary | ICD-10-CM | POA: Diagnosis not present

## 2023-06-23 DIAGNOSIS — L814 Other melanin hyperpigmentation: Secondary | ICD-10-CM | POA: Diagnosis not present

## 2023-06-23 DIAGNOSIS — D485 Neoplasm of uncertain behavior of skin: Secondary | ICD-10-CM | POA: Diagnosis not present

## 2023-06-23 DIAGNOSIS — C4442 Squamous cell carcinoma of skin of scalp and neck: Secondary | ICD-10-CM | POA: Diagnosis not present

## 2023-06-23 DIAGNOSIS — L578 Other skin changes due to chronic exposure to nonionizing radiation: Secondary | ICD-10-CM | POA: Diagnosis not present

## 2023-06-23 DIAGNOSIS — L820 Inflamed seborrheic keratosis: Secondary | ICD-10-CM | POA: Diagnosis not present

## 2023-06-23 DIAGNOSIS — Z8589 Personal history of malignant neoplasm of other organs and systems: Secondary | ICD-10-CM | POA: Diagnosis not present

## 2023-06-23 DIAGNOSIS — B079 Viral wart, unspecified: Secondary | ICD-10-CM | POA: Diagnosis not present

## 2023-06-23 DIAGNOSIS — D1801 Hemangioma of skin and subcutaneous tissue: Secondary | ICD-10-CM | POA: Diagnosis not present

## 2023-06-23 DIAGNOSIS — L821 Other seborrheic keratosis: Secondary | ICD-10-CM | POA: Diagnosis not present

## 2023-06-23 DIAGNOSIS — L570 Actinic keratosis: Secondary | ICD-10-CM | POA: Diagnosis not present

## 2023-07-03 DIAGNOSIS — C4442 Squamous cell carcinoma of skin of scalp and neck: Secondary | ICD-10-CM | POA: Diagnosis not present

## 2023-09-15 DIAGNOSIS — E781 Pure hyperglyceridemia: Secondary | ICD-10-CM | POA: Diagnosis not present

## 2023-09-15 DIAGNOSIS — E1169 Type 2 diabetes mellitus with other specified complication: Secondary | ICD-10-CM | POA: Diagnosis not present

## 2023-09-15 DIAGNOSIS — R972 Elevated prostate specific antigen [PSA]: Secondary | ICD-10-CM | POA: Diagnosis not present

## 2023-09-15 DIAGNOSIS — Z1389 Encounter for screening for other disorder: Secondary | ICD-10-CM | POA: Diagnosis not present

## 2023-09-15 DIAGNOSIS — I480 Paroxysmal atrial fibrillation: Secondary | ICD-10-CM | POA: Diagnosis not present

## 2023-09-18 DIAGNOSIS — H401123 Primary open-angle glaucoma, left eye, severe stage: Secondary | ICD-10-CM | POA: Diagnosis not present

## 2023-09-18 DIAGNOSIS — H401113 Primary open-angle glaucoma, right eye, severe stage: Secondary | ICD-10-CM | POA: Diagnosis not present

## 2023-10-13 DIAGNOSIS — H401113 Primary open-angle glaucoma, right eye, severe stage: Secondary | ICD-10-CM | POA: Diagnosis not present

## 2023-10-13 DIAGNOSIS — H401123 Primary open-angle glaucoma, left eye, severe stage: Secondary | ICD-10-CM | POA: Diagnosis not present

## 2023-11-03 ENCOUNTER — Other Ambulatory Visit (HOSPITAL_BASED_OUTPATIENT_CLINIC_OR_DEPARTMENT_OTHER): Payer: Self-pay

## 2023-11-03 MED ORDER — FLUZONE HIGH-DOSE 0.5 ML IM SUSY
0.5000 mL | PREFILLED_SYRINGE | Freq: Once | INTRAMUSCULAR | 0 refills | Status: AC
  Filled 2023-11-03: qty 0.5, 1d supply, fill #0

## 2023-12-01 ENCOUNTER — Other Ambulatory Visit: Payer: Self-pay

## 2023-12-01 DIAGNOSIS — L814 Other melanin hyperpigmentation: Secondary | ICD-10-CM | POA: Diagnosis not present

## 2023-12-01 DIAGNOSIS — L820 Inflamed seborrheic keratosis: Secondary | ICD-10-CM | POA: Diagnosis not present

## 2023-12-01 DIAGNOSIS — D229 Melanocytic nevi, unspecified: Secondary | ICD-10-CM | POA: Diagnosis not present

## 2023-12-01 DIAGNOSIS — L249 Irritant contact dermatitis, unspecified cause: Secondary | ICD-10-CM | POA: Diagnosis not present

## 2023-12-01 DIAGNOSIS — L578 Other skin changes due to chronic exposure to nonionizing radiation: Secondary | ICD-10-CM | POA: Diagnosis not present

## 2023-12-01 DIAGNOSIS — L821 Other seborrheic keratosis: Secondary | ICD-10-CM | POA: Diagnosis not present

## 2023-12-01 DIAGNOSIS — Z8589 Personal history of malignant neoplasm of other organs and systems: Secondary | ICD-10-CM | POA: Diagnosis not present

## 2023-12-01 DIAGNOSIS — L570 Actinic keratosis: Secondary | ICD-10-CM | POA: Diagnosis not present

## 2023-12-01 DIAGNOSIS — D1801 Hemangioma of skin and subcutaneous tissue: Secondary | ICD-10-CM | POA: Diagnosis not present

## 2023-12-02 MED ORDER — FLECAINIDE ACETATE 100 MG PO TABS: 100.0000 mg | ORAL_TABLET | Freq: Two times a day (BID) | ORAL | 0 refills | Status: DC

## 2023-12-12 ENCOUNTER — Telehealth: Payer: Self-pay | Admitting: Cardiology

## 2023-12-12 MED ORDER — METOPROLOL SUCCINATE ER 25 MG PO TB24: 25.0000 mg | ORAL_TABLET | Freq: Every day | ORAL | 0 refills | Status: DC

## 2023-12-12 NOTE — Telephone Encounter (Signed)
 Patient needs to schedule a follow up appointment with Dr. Inocencio.

## 2023-12-15 MED ORDER — FLECAINIDE ACETATE 100 MG PO TABS: 100.0000 mg | ORAL_TABLET | Freq: Every day | ORAL | 1 refills | Status: DC

## 2023-12-15 MED ORDER — METOPROLOL SUCCINATE ER 25 MG PO TB24: 25.0000 mg | ORAL_TABLET | Freq: Every day | ORAL | 1 refills | Status: DC

## 2023-12-15 NOTE — Telephone Encounter (Signed)
 Called pt to discuss. Pt reports he needs Flecainide  & Toprol  refilled.  He reports he is only taking Flecainide  once daily for the past 4-5 months and doing well/fine.  Discussed with Dr. Inocencio - ok to leave at daily, but pt aware it may not be effective at only a daily dosing. Will send refills for Flecainide  & Toprol .  Pt informed that he is due for his yearly follow up and I apologized that he had not received the recall/reminder letter. Aware office will call to arrange yearly follow up with Dr. Inocencio. Patient verbalized understanding and agreeable to plan.

## 2023-12-15 NOTE — Telephone Encounter (Signed)
 Call x1; lvmtcb to schedule from recall with [facility] for overdue (10/2023) yearly f/u.

## 2023-12-17 NOTE — Telephone Encounter (Signed)
 Call x2; lvmtcb to schedule patient FROM RECALL w/ [facility] for overdue yearly f/u (due 10/2023).

## 2023-12-26 ENCOUNTER — Other Ambulatory Visit (HOSPITAL_BASED_OUTPATIENT_CLINIC_OR_DEPARTMENT_OTHER): Payer: Self-pay

## 2023-12-26 MED ORDER — COMIRNATY 30 MCG/0.3ML IM SUSY
0.3000 mL | PREFILLED_SYRINGE | Freq: Once | INTRAMUSCULAR | 0 refills | Status: AC
  Filled 2023-12-26: qty 0.3, 1d supply, fill #0

## 2023-12-30 DIAGNOSIS — H903 Sensorineural hearing loss, bilateral: Secondary | ICD-10-CM | POA: Diagnosis not present

## 2024-01-08 ENCOUNTER — Other Ambulatory Visit: Payer: Self-pay

## 2024-01-08 ENCOUNTER — Emergency Department (HOSPITAL_BASED_OUTPATIENT_CLINIC_OR_DEPARTMENT_OTHER)
Admission: EM | Admit: 2024-01-08 | Discharge: 2024-01-08 | Disposition: A | Discharge status: Home / Self Care | Attending: Emergency Medicine | Admitting: Emergency Medicine

## 2024-01-08 ENCOUNTER — Encounter (HOSPITAL_BASED_OUTPATIENT_CLINIC_OR_DEPARTMENT_OTHER): Payer: Self-pay

## 2024-01-08 ENCOUNTER — Emergency Department (HOSPITAL_BASED_OUTPATIENT_CLINIC_OR_DEPARTMENT_OTHER): Admitting: Radiology

## 2024-01-08 ENCOUNTER — Emergency Department (HOSPITAL_BASED_OUTPATIENT_CLINIC_OR_DEPARTMENT_OTHER)

## 2024-01-08 DIAGNOSIS — W01198A Fall on same level from slipping, tripping and stumbling with subsequent striking against other object, initial encounter: Secondary | ICD-10-CM | POA: Insufficient documentation

## 2024-01-08 DIAGNOSIS — Z7982 Long term (current) use of aspirin: Secondary | ICD-10-CM | POA: Insufficient documentation

## 2024-01-08 DIAGNOSIS — M19041 Primary osteoarthritis, right hand: Secondary | ICD-10-CM | POA: Diagnosis not present

## 2024-01-08 DIAGNOSIS — S0083XA Contusion of other part of head, initial encounter: Secondary | ICD-10-CM | POA: Diagnosis not present

## 2024-01-08 DIAGNOSIS — M7989 Other specified soft tissue disorders: Secondary | ICD-10-CM | POA: Diagnosis not present

## 2024-01-08 DIAGNOSIS — S63286A Dislocation of proximal interphalangeal joint of right little finger, initial encounter: Secondary | ICD-10-CM | POA: Diagnosis not present

## 2024-01-08 DIAGNOSIS — S63283A Dislocation of proximal interphalangeal joint of left middle finger, initial encounter: Secondary | ICD-10-CM | POA: Diagnosis not present

## 2024-01-08 DIAGNOSIS — I10 Essential (primary) hypertension: Secondary | ICD-10-CM | POA: Insufficient documentation

## 2024-01-08 DIAGNOSIS — E039 Hypothyroidism, unspecified: Secondary | ICD-10-CM | POA: Insufficient documentation

## 2024-01-08 DIAGNOSIS — Z79899 Other long term (current) drug therapy: Secondary | ICD-10-CM | POA: Insufficient documentation

## 2024-01-08 DIAGNOSIS — S61011A Laceration without foreign body of right thumb without damage to nail, initial encounter: Secondary | ICD-10-CM | POA: Insufficient documentation

## 2024-01-08 DIAGNOSIS — S6992XA Unspecified injury of left wrist, hand and finger(s), initial encounter: Secondary | ICD-10-CM | POA: Diagnosis not present

## 2024-01-08 DIAGNOSIS — S63259A Unspecified dislocation of unspecified finger, initial encounter: Secondary | ICD-10-CM

## 2024-01-08 DIAGNOSIS — M19042 Primary osteoarthritis, left hand: Secondary | ICD-10-CM | POA: Diagnosis not present

## 2024-01-08 DIAGNOSIS — S61213A Laceration without foreign body of left middle finger without damage to nail, initial encounter: Secondary | ICD-10-CM | POA: Insufficient documentation

## 2024-01-08 DIAGNOSIS — M85842 Other specified disorders of bone density and structure, left hand: Secondary | ICD-10-CM | POA: Diagnosis not present

## 2024-01-08 MED ORDER — LIDOCAINE HCL 2 % IJ SOLN
10.0000 mL | Freq: Once | INTRAMUSCULAR | Status: AC
  Administered 2024-01-08: 200 mg
  Filled 2024-01-08: qty 20

## 2024-01-08 MED ORDER — CEPHALEXIN 500 MG PO CAPS: 500.0000 mg | ORAL_CAPSULE | Freq: Four times a day (QID) | ORAL | 0 refills | Status: AC

## 2024-01-08 MED ORDER — ACETAMINOPHEN 500 MG PO TABS
1000.0000 mg | ORAL_TABLET | Freq: Once | ORAL | Status: AC
Start: 2024-01-08 — End: 2024-01-08
  Administered 2024-01-08: 1000 mg via ORAL
  Filled 2024-01-08: qty 2

## 2024-01-08 NOTE — ED Triage Notes (Signed)
 Patient fell the last couple steps onto the drive way. He has a large left hematoma and abrasion on his forehead, a right thumb laceration, and a middle left finger laceration with exposed tendon noted. -LOC and not on anticoagulation therapy.

## 2024-01-08 NOTE — ED Provider Notes (Signed)
.  Reduction of dislocation  Date/Time: 01/08/2024 2:00 PM  Performed by: Desiderio Chew, PA-C Authorized by: Desiderio Chew, PA-C  Consent: Verbal consent obtained Patient identity confirmed: verbally with patient Local anesthesia used: successful digital block by DuFour PA-C.  Anesthesia: Local anesthesia used: successful digital block by DuFour PA-C. Patient tolerance: patient tolerated the procedure well with no immediate complications Comments: Reviewed x-ray.  Left third digit, PIP dislocation reduced with gentle traction and slight extension of the joint.  Palpable click felt with reduction.  Patient with improved range of motion of the digit.  Patient did have bleeding from the laceration on the volar aspect of the digit due to manipulation of the finger, controlled with pressure.       Desiderio Chew, PA-C 01/08/24 1626    Emil Share, DO 01/09/24 [phone]

## 2024-01-08 NOTE — ED Notes (Signed)
 Pt d/c instructions, medications, and follow-up care reviewed with pt. Pt verbalized understanding and had no further questions at time of d/c. Pt CA&Ox4, ambulatory, and in NAD at time of d/c. Pt discharged with friend

## 2024-01-08 NOTE — ED Provider Notes (Signed)
 [place] EMERGENCY DEPARTMENT AT [facility] Provider Note   CSN: [id number] Arrival date & time: 01/08/24  1232     Patient presents with: Fall and Laceration   Zachary Montoya is a 78 y.o. male with past medical history of hyperlipidemia, hypertension, GERD, hypothyroidism, open-angle glaucoma who presents to the emergency department for evaluation after a fall.  Patient states he was using his leaf blower this morning, when he missed a step and fell approximately 1-1/2 or 2 feet.  Patient hit his head, however did not lose consciousness.  Patient has 2 open lacerations, 1 on his right thumb and the other on his left middle finger.  Patient does report some mild pain at this time.  He does take daily baby aspirin , but is not on any other blood thinners.  Patient does not report any other pain.   Fall  Laceration      Prior to Admission medications   Medication Sig Start Date End Date Taking? Authorizing Provider  cephALEXin (KEFLEX) 500 MG capsule Take 1 capsule (500 mg total) by mouth 4 (four) times daily. 01/08/24  Yes Arfa Lamarca, Marry RAMAN, PA-C  acetaminophen  (TYLENOL ) 650 MG CR tablet Take 650-1,300 mg by mouth every 8 (eight) hours as needed for pain.    [provider]  acetic acid-hydrocortisone (VOSOL-HC) OTIC solution Instill 4 drops into left ear. Repeat two days out of the week. Patient not taking: Reported on 10/31/2021 05/24/21   [provider]  aspirin  81 MG chewable tablet Chew 1 tablet (81 mg total) by mouth daily. 10/27/20   Sebastian Lamarr SAUNDERS, PA-C  aspirin  81 MG chewable tablet 1 tablet Patient not taking: Reported on 11/29/2022    [provider]  atorvastatin  (LIPITOR) 20 MG tablet Take 20 mg by mouth daily. Patient not taking: Reported on 11/29/2022 05/11/14   [provider]  atorvastatin  (LIPITOR) 20 MG tablet Take 1 tablet by mouth daily.    [provider]  B Complex-C (B-COMPLEX WITH VITAMIN C) tablet Take 1  tablet by mouth daily.    [provider]  cetirizine (ZYRTEC) 10 MG tablet Take 10 mg by mouth daily.    [provider]  cholecalciferol (VITAMIN D) 25 MCG (1000 UNIT) tablet Take 1,000 Units by mouth daily.    [provider]  Coenzyme Q10 (COQ10) 200 MG CAPS Take 200 mg by mouth daily. Patient not taking: Reported on 10/31/2021    [provider]  COVID-19 mRNA bivalent vaccine, Pfizer, (PFIZER COVID-19 VAC BIVALENT) injection Inject into the muscle. 12/29/20   Luiz Channel, MD  COVID-19 mRNA vaccine [phone] (COMIRNATY ) syringe Inject into the muscle. 12/13/21   Luiz Channel, MD  ferrous sulfate  325 (65 FE) MG tablet Take 325 mg by mouth daily. Patient not taking: Reported on 11/29/2022    [provider]  flecainide  (TAMBOCOR ) 100 MG tablet Take 1 tablet (100 mg total) by mouth daily. 12/15/23   Camnitz, Soyla Lunger, MD  influenza vaccine adjuvanted (FLUAD  QUADRIVALENT) 0.5 ML injection Inject into the muscle. Patient not taking: Reported on 11/29/2022 11/20/20     influenza vaccine adjuvanted (FLUAD ) 0.5 ML injection Inject into the muscle. Patient not taking: Reported on 11/29/2022 11/19/21   Luiz Channel, MD  metoprolol  succinate (TOPROL -XL) 25 MG 24 hr tablet Take 1 tablet (25 mg total) by mouth daily. 12/15/23   Camnitz, Soyla Lunger, MD  Multiple Vitamins-Minerals (CENTRUM ADULTS PO) Take 1 tablet by mouth daily.    [provider]  Natale  3 1000 MG CAPS Take 1,000 mg by mouth daily.    [provider]  omeprazole (PRILOSEC) 20 MG capsule Take 20 mg by mouth daily. 10/22/21   [provider]  pantoprazole  (PROTONIX ) 40 MG tablet Take 1 tablet (40 mg total) by mouth daily. 12/11/20 06/09/21  Cindie Ole DASEN, MD  polyethylene glycol (MIRALAX  / GLYCOLAX ) 17 g packet Take 17 g by mouth daily.    [provider]  RSV vaccine recomb adjuvanted (AREXVY ) 120 MCG/0.5ML injection Inject into the muscle. 01/22/22    Luiz Channel, MD  triamcinolone  (NASACORT ) 55 MCG/ACT AERO nasal inhaler Place 1 spray into the nose daily as needed (allergies).    [provider]    Allergies: Bee venom, Timolol, Brimonidine tartrate, and Codeine    Review of Systems  Skin:  Positive for wound.    Updated Vital Signs BP (!) 151/87   Pulse (!) 55   Temp 97.7 F (36.5 C) (Temporal)   Resp 16   SpO2 100%   Physical Exam Vitals and nursing note reviewed.  Constitutional:      Appearance: Normal appearance. He is not ill-appearing.  HENT:     Head:     Comments: Large left-sided hematoma with open abrasion on patient's forehead.  Hemostatic at this time.  Bleeding is controlled. Eyes:     General: No scleral icterus. Pulmonary:     Effort: Pulmonary effort is normal. No respiratory distress.  Musculoskeletal:        General: Signs of injury present. No deformity.     Comments: 1 approximately 1 cm laceration to the right thumb.  Hemostatic and bleeding controlled at this time.  Approximately 4 cm laceration to left middle finger with tendon exposure.  Patient unable to flex finger.  There is significant swelling distal to the injury.  Patient also has a hematoma on his left elbow without bleeding.  No additional bruising, swelling and noted to bilateral shoulders, forearm or lower extremities.  Skin:    Coloration: Skin is not jaundiced.  Neurological:     General: No focal deficit present.     Mental Status: He is alert.  Psychiatric:        Mood and Affect: Mood normal.     (all labs ordered are listed, but only abnormal results are displayed) Labs Reviewed - No data to display  EKG: None  Radiology: DG Finger Middle Left Result Date: 01/08/2024 CLINICAL DATA:  Postreduction. EXAM: LEFT MIDDLE FINGER 2+V COMPARISON:  Earlier radiograph dated 01/08/2024. FINDINGS: Interval reduction of the previously seen dislocated proximal interphalangeal joint of the third digit, now in anatomic  alignment. No acute fracture. The bones are osteopenic. Mild arthritic changes of the interphalangeal joint. The soft tissue swelling of the third digit. No opaque foreign object sized gas. IMPRESSION: Successful interval reduction of the third digit. Electronically Signed   By: Vanetta Chou M.D.   On: 01/08/2024 17:10   DG Finger Middle Left Result Date: 01/08/2024 CLINICAL DATA:  Fall.  Injury. EXAM: LEFT MIDDLE FINGER 2+V COMPARISON:  None Available. FINDINGS: Dorsal dislocation of the left third middle phalanx with mild proximal distraction at the third proximal interphalangeal joint. No definite fracture identified. Overlying open soft tissue wound and laceration of the mid third digit. No radiopaque foreign body. The remainder of the visualized bones appear intact. 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 Mild osteoarthritis of the third DIP joint. IMPRESSION: 1. Dorsal dislocation of the left third middle phalanx with mild  proximal distraction at the third proximal interphalangeal joint. No definite fracture identified. 2. Overlying open soft tissue wound and laceration of the mid third digit. No radiopaque foreign body. Electronically Signed   By: Harrietta Sherry M.D.   On: 01/08/2024 14:28   DG Finger Thumb Right Result Date: 01/08/2024 CLINICAL DATA:  Fall.  Injury. EXAM: RIGHT THUMB 2+V COMPARISON:  None Available. FINDINGS: No acute fracture or dislocation. Mild osteoarthritis of the first MCP joint and second and third PIP joints. Soft tissue laceration of the distal thumb with swelling. No radiopaque foreign body. IMPRESSION: 1. No acute osseous abnormality. 2. Soft tissue laceration of the distal thumb. No radiopaque foreign body. Electronically Signed   By: Harrietta Sherry M.D.   On: 01/08/2024 14:25   CT Head Wo Contrast Result Date: 01/08/2024 EXAM: CT HEAD WITHOUT CONTRAST 01/08/2024 01:35:00 PM TECHNIQUE: CT of the head was performed without the administration of  intravenous contrast. Automated exposure control, iterative reconstruction, and/or weight based adjustment of the mA/kV was utilized to reduce the radiation dose to as low as reasonably achievable. COMPARISON: Brain MRI 03/28/2020 and prior head CT 03/28/2020. CLINICAL HISTORY: 78 year old male status post fall on steps with forehead hematoma. FINDINGS: BRAIN AND VENTRICLES: No acute hemorrhage. No evidence of acute infarct. No hydrocephalus. No extra-axial collection. No mass effect or midline shift. Brain volume not significantly changed since 03/28/2020, with minimal limits for age. Faint chronic basal ganglia vascular calcifications are stable. Gray white differentiation within normal limits for age. No suspicious intracranial vascular hyperdensity. Calcified atherosclerosis at the skull base. ORBITS: No acute abnormality. SINUSES: Paranasal sinuses remain well aerated. SOFT TISSUES AND SKULL: Anterior left forehead, supraorbital scalp hematoma measuring up to 9 mm. Underlying left frontal bone and sinus appear intact. Other soft tissues appear to remain normal. No scalp soft tissue gas. Middle ears and mastoids remain well aerated. IMPRESSION: 1. Anterior left forehead supraorbital scalp soft tissue injury. No skull fracture. 2. Stable and negative for age non contrast CT appearance of the brain. Electronically signed by: Helayne Hurst MD 01/08/2024 02:01 PM EST RP Workstation: [id number]    .Reduction of dislocation  Date/Time: 01/08/2024 9:33 PM  Performed by: Torrence Marry RAMAN, PA-C Authorized by: Torrence Marry RAMAN, PA-C  Consent: Verbal consent obtained Risks and benefits: risks, benefits and alternatives were discussed Consent given by: patient Patient understanding: patient states understanding of the procedure being performed Patient consent: the patient's understanding of the procedure matches consent given Relevant documents: relevant documents present and verified Imaging studies:  imaging studies available Patient identity confirmed: verbally with patient Local anesthesia used: yes Anesthesia: digital block  Anesthesia: Local anesthesia used: yes Local Anesthetic: lidocaine  2% without epinephrine  Anesthetic total: 6 mL  Sedation: Patient sedated: no  Patient tolerance: patient tolerated the procedure well with no immediate complications   .Laceration Repair  Date/Time: 01/08/2024 9:34 PM  Performed by: Torrence Marry RAMAN, PA-C Authorized by: Torrence Marry RAMAN, PA-C   Consent:    Consent obtained:  Verbal   Consent given by:  Patient   Risks, benefits, and alternatives were discussed: yes     Risks discussed:  Infection, pain and poor cosmetic result   Alternatives discussed:  No treatment Universal protocol:    Procedure explained and questions answered to patient or proxy's satisfaction: yes     Relevant documents present and verified: yes     Imaging studies available: yes     Patient identity confirmed:  Verbally with patient Anesthesia:    Anesthesia  method:  Nerve block   Block location:  Left middle finger   Block needle gauge:  24 G   Block anesthetic:  Lidocaine  2% w/o epi   Block technique:  Ring approach   Block injection procedure:  Incremental injection, negative aspiration for blood and introduced needle   Block outcome:  Anesthesia achieved Laceration details:    Location:  Finger   Finger location:  L long finger   Length (cm):  4   Depth (mm):  2 Pre-procedure details:    Preparation:  Imaging obtained to evaluate for foreign bodies Exploration:    Limited defect created (wound extended): yes     Hemostasis achieved with:  Direct pressure   Imaging obtained: x-ray     Imaging outcome: foreign body not noted     Wound exploration: wound explored through full range of motion     Wound extent: tendon damage     Tendon damage location:  Upper extremity   Upper extremity tendon damage location:  Finger flexor   Finger  flexor tendon:  Flexor digitorum superficialis   Tendon damage extent:  Partial transection   Tendon repair plan:  Refer for evaluation   Contaminated: yes   Treatment:    Area cleansed with:  Saline   Amount of cleaning:  Extensive   Irrigation solution:  Sterile water   Irrigation volume:    Irrigation method:  Pressure wash   Debridement:  Minimal   Scar revision: no   Skin repair:    Repair method:  Sutures   Suture size:  4-0   Suture material:  Prolene   Suture technique:  Simple interrupted   Number of sutures:  8 Approximation:    Approximation:  Close Repair type:    Repair type:  Complex Post-procedure details:    Dressing:  Non-adherent dressing and splint for protection   Procedure completion:  Tolerated with difficulty .Laceration Repair  Date/Time: 01/08/2024 9:36 PM  Performed by: Torrence Marry RAMAN, PA-C Authorized by: Torrence Marry RAMAN, PA-C   Consent:    Consent obtained:  Verbal   Consent given by:  Patient   Risks, benefits, and alternatives were discussed: yes     Risks discussed:  Infection, pain and poor cosmetic result   Alternatives discussed:  No treatment Universal protocol:    Procedure explained and questions answered to patient or proxy's satisfaction: yes     Relevant documents present and verified: yes     Imaging studies available: yes     Patient identity confirmed:  Verbally with patient Anesthesia:    Anesthesia method:  Local infiltration   Local anesthetic:  Lidocaine  2% w/o epi Laceration details:    Location:  Finger   Finger location:  R thumb   Length (cm):  2   Depth (mm):  0 Pre-procedure details:    Preparation:  Imaging obtained to evaluate for foreign bodies Exploration:    Limited defect created (wound extended): yes     Hemostasis achieved with:  Direct pressure   Imaging obtained: x-ray     Imaging outcome: foreign body not noted     Wound exploration: entire depth of wound visualized     Wound  extent: no underlying fracture     Contaminated: yes   Treatment:    Area cleansed with:  Saline   Amount of cleaning:  Extensive   Irrigation solution:  Sterile water   Irrigation volume:  200   Irrigation method:  Pressure wash  Visualized foreign bodies/material removed: no     Debridement:  Minimal   Scar revision: no   Skin repair:    Repair method:  Sutures   Suture size:  4-0   Suture material:  Prolene   Suture technique:  Simple interrupted   Number of sutures:  3 Approximation:    Approximation:  Close Repair type:    Repair type:  Simple Post-procedure details:    Dressing:  Open (no dressing)   Procedure completion:  Tolerated well, no immediate complications .Critical Care  Performed by: Torrence Marry RAMAN, PA-C Authorized by: Torrence Marry RAMAN, PA-C   Critical care provider statement:    Critical care time (minutes):  80   Critical care was necessary to treat or prevent imminent or life-threatening deterioration of the following conditions:  Trauma   Critical care was time spent personally by me on the following activities:  Development of treatment plan with patient or surrogate, discussions with consultants, evaluation of patient's response to treatment, examination of patient, obtaining history from patient or surrogate, re-evaluation of patient's condition, ordering and review of radiographic studies and ordering and performing treatments and interventions   I assumed direction of critical care for this patient from another provider in my specialty: no     Care discussed with comment:  Consult team    Medications Ordered in the ED  lidocaine  (XYLOCAINE ) 2 % (with pres) injection 200 mg (200 mg Infiltration Given by Other 01/08/24 1323)  acetaminophen  (TYLENOL ) tablet 1,000 mg (1,000 mg Oral Given 01/08/24 1428)                                Medical Decision Making Amount and/or Complexity of Data Reviewed Radiology: ordered.  Risk OTC  drugs. Prescription drug management.   This patient presents to the ED for concern of fall, this involves an extensive number of treatment options, and is a complaint that carries with it a high risk of complications and morbidity.  Differential diagnosis includes: Scalp laceration, hematoma, concussion skull fracture, soft tissue contusion, epidural hematoma, acute subdural hematoma  Co morbidities:  hyperlipidemia, A-fib not on anticoagulation, hypertension   Lab Tests:  Not indicated  Imaging Studies:  I ordered imaging studies including CT head without contrast, right thumb and left middle finger x-ray I independently visualized and interpreted imaging which showed   CT Head Anterior left forehead, supraorbital scalp hematoma measuring up to 9 mm.  Underlying left frontal bone and sinus appear intact. Other soft tissues appear  to remain normal. No scalp soft tissue gas. Middle ears and mastoids remain  well aerated.    I agree with the radiologist interpretation  Cardiac Monitoring/ECG:  The patient was maintained on a cardiac monitor.  I personally viewed and interpreted the cardiac monitored which showed an underlying rhythm of: Sinus bradycardia  Medicines ordered and prescription drug management:  I ordered medication including  Medications  lidocaine  (XYLOCAINE ) 2 % (with pres) injection 200 mg (200 mg Infiltration Given by Other 01/08/24 1323)  acetaminophen  (TYLENOL ) tablet 1,000 mg (1,000 mg Oral Given 01/08/24 1428)   for topical numbing Reevaluation of the patient after these medicines showed that the patient improved I have reviewed the patients home medicines and have made adjustments as needed  Test Considered:   none  Critical Interventions:   multiple complicated suture repairs with copious irrigation volume - Some difficulty achieving hemostasis - Extensive time spent in patient  room providing direct patient care including wound irrigation,  suturing, applying surgical glue and Steri-Strips  Consultations Obtained: -Hand surgery  Problem List / ED Course:     ICD-10-CM   1. Traumatic hematoma of forehead, initial encounter  S00.83XA     2. Finger dislocation, initial encounter  S63.259A     3. Laceration of right thumb without foreign body without damage to nail, initial encounter  S61.011A     4. Laceration of left middle finger without foreign body without damage to nail, initial encounter  D38.786J       MDM: 78 year old male who presents emergency department for evaluation after a fall.  Patient was using a leaf blower when he fell approximately 1-1/2 to 2 feet.  Patient hit his head and has a large left-sided hematoma to his left forehead.  He is not on anticoagulation therapy, however given obvious deformity and his age greater than 28 years old I have ordered a CT head.  CT results show no acute intracranial abnormality.  However there is evidence of supraorbital scalp hematoma that measures 9 mm.  Left frontal bone is intact.  No evidence of scalp gas.  Additionally patient has a 2 cm laceration to his right thumb.  Bleeding is controlled at this time.  Copious irrigation to wound.  Approximately 1400 mL of sterile water used to clean both lacerations.  Patient also has a 4 cm laceration to his left middle finger.  There is obvious tendon exposure.  Patient is unable to flex his finger and there is significant edema distal to the injury.  X-rays of both lacerations have been ordered.  X-ray shows no evidence of foreign body.  However there is evidence of PIP joint dislocation on the left middle finger.  Digital block was performed for numbing.  Joint was successfully reduced.  After reduction and copious amounts of irrigation, both lacerations were appropriately sutured.  Procedure note provided above.  Repeat x-ray shows appropriately reduced PIP joint.  Consult to hand surgery placed for further recommendations.  I spoke  with Dr. Murrell who advised I also treat the patient with prophylactic antibiotics.  I ordered 5 days of Keflex for the patient.  Additionally, plan for patient to follow-up with hand surgery outpatient next week.  Patient verbalized his understanding to this.  His vital signs are stable.  Patient is appropriate for discharge at this time.   Dispostion:  After consideration of the diagnostic results and the patients response to treatment, I feel that the patient would benefit from outpatient hand surgery follow-up.   Final diagnoses:  Traumatic hematoma of forehead, initial encounter  Finger dislocation, initial encounter  Laceration of right thumb without foreign body without damage to nail, initial encounter  Laceration of left middle finger without foreign body without damage to nail, initial encounter    ED Discharge Orders          Ordered    cephALEXin (KEFLEX) 500 MG capsule  4 times daily        01/08/24 1657               Derren Suydam S, PA-C 01/08/24 2143    Emil Share, DO 01/09/24 [phone]

## 2024-01-08 NOTE — ED Notes (Signed)
 Pt ambulatory with steady gait, family standby assist to restroom

## 2024-01-08 NOTE — Discharge Instructions (Signed)
 It was a pleasure taking care of you today. You were seen in the Emergency Department for evaluation after a fall. Your work-up was reassuring. Your head CT shows no evidence of bleeding or fracture.  There is obvious swelling noted both on physical exam and on CT.  You also had a small laceration above your left eye, which I used Steri-Strips to stop the bleeding.  I recommend keeping the Steri-Strips on until tomorrow.  If bleeding continues, you may utilize Band-Aids.  Additionally, I sutured your right thumb and used surgical glue on the nail bed.  The glue will come off naturally.  Please do your best to keep the area warm and dry.  You may wash your hand gently, but do not scrub the area with the sutures.  Your left middle finger was also dislocated.  It was successfully reduced in the emergency department.  I also placed several sutures on your left middle finger.  Your finger is being placed in a splint, which should remain in place until you follow-up with hand surgery.  You may take the splint off to shower but it should remain on at all other times.  I have given you a referral to hand surgery in the contact information is on your discharge paperwork.  Please call them as soon as possible to schedule a follow-up appointment.  Your sutures will also need to be removed in about 7-10 days and this can be done at your follow-up appointment or any urgent care or your regular PCPs office.  You may take Tylenol  and or ibuprofen as needed for pain management.  You can alternate between these medications every 3 hours.  Finally, I am giving a prescription for Keflex, which is an antibiotic.  You will need to take this medication 4 times a day for the next 5 days.  It is important that you complete your antibiotic course.  Refer to the attached documentation for further management of your symptoms.   Please return to the ER if you experience chest pain, trouble breathing, intractable nausea/vomiting or any other  life threatening illnesses.

## 2024-01-09 DIAGNOSIS — S61011A Laceration without foreign body of right thumb without damage to nail, initial encounter: Secondary | ICD-10-CM | POA: Diagnosis not present

## 2024-01-13 DIAGNOSIS — S63283A Dislocation of proximal interphalangeal joint of left middle finger, initial encounter: Secondary | ICD-10-CM | POA: Diagnosis not present

## 2024-01-13 DIAGNOSIS — S63502A Unspecified sprain of left wrist, initial encounter: Secondary | ICD-10-CM | POA: Diagnosis not present

## 2024-01-13 DIAGNOSIS — M25642 Stiffness of left hand, not elsewhere classified: Secondary | ICD-10-CM | POA: Diagnosis not present

## 2024-01-13 DIAGNOSIS — S61203A Unspecified open wound of left middle finger without damage to nail, initial encounter: Secondary | ICD-10-CM | POA: Diagnosis not present

## 2024-01-13 DIAGNOSIS — M79645 Pain in left finger(s): Secondary | ICD-10-CM | POA: Diagnosis not present

## 2024-01-13 DIAGNOSIS — S61011A Laceration without foreign body of right thumb without damage to nail, initial encounter: Secondary | ICD-10-CM | POA: Diagnosis not present

## 2024-01-13 DIAGNOSIS — M25532 Pain in left wrist: Secondary | ICD-10-CM | POA: Diagnosis not present

## 2024-01-16 ENCOUNTER — Encounter: Payer: Self-pay | Admitting: Cardiology

## 2024-01-16 ENCOUNTER — Ambulatory Visit: Attending: Cardiology | Admitting: Cardiology

## 2024-01-16 VITALS — BP 126/86 | HR 56 | Ht 68.0 in | Wt 193.0 lb

## 2024-01-16 DIAGNOSIS — D6869 Other thrombophilia: Secondary | ICD-10-CM

## 2024-01-16 DIAGNOSIS — G4733 Obstructive sleep apnea (adult) (pediatric): Secondary | ICD-10-CM | POA: Diagnosis not present

## 2024-01-16 DIAGNOSIS — I480 Paroxysmal atrial fibrillation: Secondary | ICD-10-CM

## 2024-01-16 MED ORDER — FLECAINIDE ACETATE 100 MG PO TABS: 100.0000 mg | ORAL_TABLET | Freq: Every day | ORAL | 3 refills | Status: AC

## 2024-01-16 MED ORDER — METOPROLOL SUCCINATE ER 25 MG PO TB24: 25.0000 mg | ORAL_TABLET | Freq: Every day | ORAL | 3 refills | Status: AC

## 2024-01-16 NOTE — Progress Notes (Signed)
  Electrophysiology Office Note:   Date:  01/16/2024  ID:  Zachary Montoya, DOB January 25, 1946, MRN [id number]  Primary Cardiologist: None Primary Heart Failure: None Electrophysiologist: Amara Justen Gladis Norton, MD      History of Present Illness:   Zachary Montoya is a 78 y.o. male with h/o sleep apnea, atrial fibrillation, diabetes seen today for routine electrophysiology followup.   Since last being seen in our clinic the patient reports doing well.  He has noted no further episodes of atrial fibrillation.  He did recently fall and had  left forearm fracture, left middle finger dislocation, laceration of the right thumb, black eyes.  He is not did not go into atrial fibrillation around the timing of the fall.  he denies chest pain, palpitations, dyspnea, PND, orthopnea, nausea, vomiting, dizziness, syncope, edema, weight gain, or early satiety.   Review of systems complete and found to be negative unless listed in HPI.   EP Information / Studies Reviewed:    EKG is ordered today. Personal review as below.  EKG Interpretation Date/Time:  Friday January 16 2024 14:47:52 EST Ventricular Rate:  56 PR Interval:  188 QRS Duration:  98 QT Interval:  464 QTC Calculation: 447 R Axis:   -34  Text Interpretation: Sinus bradycardia Left axis deviation When compared with ECG of 29-Nov-2022 11:46, No significant change was found Confirmed by Brinae Woods ([id number]) on 01/16/2024 2:52:43 PM   Risk Assessment/Calculations:    CHA2DS2-VASc Score = 3   This indicates a 3.2% annual risk of stroke. The patient's score is based upon: CHF History: 0 HTN History: 0 Diabetes History: 1 Stroke History: 0 Vascular Disease History: 0 Age Score: 2 Gender Score: 0            Physical Exam:   VS:  BP 126/86 (BP Location: Right Arm, Patient Position: Sitting, Cuff Size: Large)   Pulse (!) 56   Ht 5' 8 (1.727 m)   Wt 193 lb (87.5 kg)   SpO2 97%   BMI 29.35 kg/m    Wt Readings from Last 3  Encounters:  01/16/24 193 lb (87.5 kg)  11/29/22 203 lb (92.1 kg)  10/31/21 203 lb (92.1 kg)     GEN: Well nourished, well developed in no acute distress NECK: No JVD; No carotid bruits CARDIAC: Regular rate and rhythm, no murmurs, rubs, gallops RESPIRATORY:  Clear to auscultation without rales, wheezing or rhonchi  ABDOMEN: Soft, non-tender, non-distended EXTREMITIES:  No edema; No deformity   ASSESSMENT AND PLAN:    1.  Paroxysmal atrial fibrillation: On diltiazem  and flecainide .  He has had no further episodes of atrial fibrillation in the last year.  He is doing well.  He is taking his flecainide  on a daily basis and has been instructed to take it twice daily.  Aside from that, he has having no issues.  2.  Secondary hypercoagulable state: [employer] watchman  3.  Obstructive sleep apnea: CPAP compliance encouraged  Follow up with EP Team in 12 months  Signed, Regis Hinton Gladis Norton, MD

## 2024-01-16 NOTE — Addendum Note (Signed)
 Addended by: GRETEL MAEOLA CROME on: 01/16/2024 03:15 PM   Modules accepted: Orders

## 2024-01-21 ENCOUNTER — Ambulatory Visit: Admitting: Cardiology

## 2024-01-21 DIAGNOSIS — S61011D Laceration without foreign body of right thumb without damage to nail, subsequent encounter: Secondary | ICD-10-CM | POA: Diagnosis not present

## 2024-01-21 DIAGNOSIS — S63283A Dislocation of proximal interphalangeal joint of left middle finger, initial encounter: Secondary | ICD-10-CM | POA: Diagnosis not present

## 2024-01-21 DIAGNOSIS — S61203A Unspecified open wound of left middle finger without damage to nail, initial encounter: Secondary | ICD-10-CM | POA: Diagnosis not present

## 2024-01-21 DIAGNOSIS — H401123 Primary open-angle glaucoma, left eye, severe stage: Secondary | ICD-10-CM | POA: Diagnosis not present

## 2024-01-21 DIAGNOSIS — S63502D Unspecified sprain of left wrist, subsequent encounter: Secondary | ICD-10-CM | POA: Diagnosis not present

## 2024-01-21 DIAGNOSIS — H401113 Primary open-angle glaucoma, right eye, severe stage: Secondary | ICD-10-CM | POA: Diagnosis not present

## 2024-01-30 DIAGNOSIS — N401 Enlarged prostate with lower urinary tract symptoms: Secondary | ICD-10-CM | POA: Diagnosis not present

## 2024-02-04 DIAGNOSIS — S61011D Laceration without foreign body of right thumb without damage to nail, subsequent encounter: Secondary | ICD-10-CM | POA: Diagnosis not present

## 2024-02-04 DIAGNOSIS — S61203A Unspecified open wound of left middle finger without damage to nail, initial encounter: Secondary | ICD-10-CM | POA: Diagnosis not present

## 2024-02-04 DIAGNOSIS — S63283A Dislocation of proximal interphalangeal joint of left middle finger, initial encounter: Secondary | ICD-10-CM | POA: Diagnosis not present

## 2024-02-04 DIAGNOSIS — S63502D Unspecified sprain of left wrist, subsequent encounter: Secondary | ICD-10-CM | POA: Diagnosis not present

## 2024-02-13 DIAGNOSIS — M79645 Pain in left finger(s): Secondary | ICD-10-CM | POA: Diagnosis not present

## 2024-02-13 DIAGNOSIS — S63283A Dislocation of proximal interphalangeal joint of left middle finger, initial encounter: Secondary | ICD-10-CM | POA: Diagnosis not present

## 2024-02-13 DIAGNOSIS — S61203A Unspecified open wound of left middle finger without damage to nail, initial encounter: Secondary | ICD-10-CM | POA: Diagnosis not present

## 2024-02-13 DIAGNOSIS — M25642 Stiffness of left hand, not elsewhere classified: Secondary | ICD-10-CM | POA: Diagnosis not present

## 2024-02-18 DIAGNOSIS — S61203A Unspecified open wound of left middle finger without damage to nail, initial encounter: Secondary | ICD-10-CM | POA: Diagnosis not present

## 2024-02-18 DIAGNOSIS — S63283A Dislocation of proximal interphalangeal joint of left middle finger, initial encounter: Secondary | ICD-10-CM | POA: Diagnosis not present
